# Patient Record
Sex: Female | Born: 1980 | Race: Black or African American | Hispanic: No | Marital: Married | Smoking: Never smoker
Health system: Southern US, Community
[De-identification: ages and names within clinical notes are randomized; demographics above are authoritative.]

## PROBLEM LIST (undated history)

## (undated) DIAGNOSIS — T8859XA Other complications of anesthesia, initial encounter: Secondary | ICD-10-CM

## (undated) DIAGNOSIS — K8020 Calculus of gallbladder without cholecystitis without obstruction: Secondary | ICD-10-CM

## (undated) DIAGNOSIS — D72829 Elevated white blood cell count, unspecified: Secondary | ICD-10-CM

## (undated) DIAGNOSIS — E119 Type 2 diabetes mellitus without complications: Secondary | ICD-10-CM

## (undated) DIAGNOSIS — N63 Unspecified lump in unspecified breast: Secondary | ICD-10-CM

## (undated) DIAGNOSIS — M5430 Sciatica, unspecified side: Secondary | ICD-10-CM

## (undated) DIAGNOSIS — I10 Essential (primary) hypertension: Secondary | ICD-10-CM

## (undated) DIAGNOSIS — T4145XA Adverse effect of unspecified anesthetic, initial encounter: Secondary | ICD-10-CM

## (undated) DIAGNOSIS — G4730 Sleep apnea, unspecified: Secondary | ICD-10-CM

## (undated) DIAGNOSIS — R739 Hyperglycemia, unspecified: Secondary | ICD-10-CM

## (undated) DIAGNOSIS — M545 Low back pain, unspecified: Secondary | ICD-10-CM

## (undated) DIAGNOSIS — F329 Major depressive disorder, single episode, unspecified: Secondary | ICD-10-CM

## (undated) DIAGNOSIS — N390 Urinary tract infection, site not specified: Secondary | ICD-10-CM

## (undated) DIAGNOSIS — F32A Depression, unspecified: Secondary | ICD-10-CM

## (undated) HISTORY — DX: Urinary tract infection, site not specified: N39.0

## (undated) HISTORY — DX: Sleep apnea, unspecified: G47.30

## (undated) HISTORY — DX: Unspecified lump in unspecified breast: N63.0

## (undated) HISTORY — DX: Type 2 diabetes mellitus without complications: E11.9

## (undated) HISTORY — DX: Essential (primary) hypertension: I10

## (undated) HISTORY — DX: Elevated white blood cell count, unspecified: D72.829

## (undated) HISTORY — DX: Depression, unspecified: F32.A

## (undated) HISTORY — DX: Major depressive disorder, single episode, unspecified: F32.9

## (undated) HISTORY — DX: Hyperglycemia, unspecified: R73.9

## (undated) HISTORY — DX: Morbid (severe) obesity due to excess calories: E66.01

---

## 2003-12-14 ENCOUNTER — Emergency Department (HOSPITAL_COMMUNITY): Admission: EM | Admit: 2003-12-14 | Discharge: 2003-12-14 | Payer: Self-pay | Admitting: Emergency Medicine

## 2004-10-27 ENCOUNTER — Emergency Department (HOSPITAL_COMMUNITY): Admission: EM | Admit: 2004-10-27 | Discharge: 2004-10-27 | Payer: Self-pay | Admitting: Emergency Medicine

## 2005-06-16 ENCOUNTER — Emergency Department (HOSPITAL_COMMUNITY): Admission: EM | Admit: 2005-06-16 | Discharge: 2005-06-16 | Payer: Self-pay | Admitting: *Deleted

## 2005-12-19 ENCOUNTER — Emergency Department (HOSPITAL_COMMUNITY): Admission: EM | Admit: 2005-12-19 | Discharge: 2005-12-19 | Payer: Self-pay | Admitting: Emergency Medicine

## 2005-12-28 ENCOUNTER — Emergency Department (HOSPITAL_COMMUNITY): Admission: EM | Admit: 2005-12-28 | Discharge: 2005-12-28 | Payer: Self-pay | Admitting: Emergency Medicine

## 2007-02-13 ENCOUNTER — Ambulatory Visit: Payer: Self-pay | Admitting: Family Medicine

## 2007-02-13 DIAGNOSIS — F329 Major depressive disorder, single episode, unspecified: Secondary | ICD-10-CM

## 2007-02-16 ENCOUNTER — Encounter (INDEPENDENT_AMBULATORY_CARE_PROVIDER_SITE_OTHER): Payer: Self-pay | Admitting: Family Medicine

## 2007-02-17 ENCOUNTER — Telehealth (INDEPENDENT_AMBULATORY_CARE_PROVIDER_SITE_OTHER): Payer: Self-pay | Admitting: *Deleted

## 2007-02-17 LAB — CONVERTED CEMR LAB
ALT: 12 units/L (ref 0–35)
AST: 13 units/L (ref 0–37)
Albumin: 4 g/dL (ref 3.5–5.2)
Alkaline Phosphatase: 62 units/L (ref 39–117)
BUN: 18 mg/dL (ref 6–23)
Basophils Absolute: 0 10*3/uL (ref 0.0–0.1)
Basophils Relative: 0 % (ref 0–1)
CO2: 23 meq/L (ref 19–32)
Calcium: 9.2 mg/dL (ref 8.4–10.5)
Chloride: 106 meq/L (ref 96–112)
Cholesterol: 177 mg/dL (ref 0–200)
Creatinine, Ser: 0.96 mg/dL (ref 0.40–1.20)
Eosinophils Absolute: 0.6 10*3/uL (ref 0.0–0.7)
Eosinophils Relative: 5 % (ref 0–5)
Glucose, Bld: 110 mg/dL — ABNORMAL HIGH (ref 70–99)
HCT: 42.9 % (ref 36.0–46.0)
HDL: 39 mg/dL — ABNORMAL LOW (ref >39)
Hemoglobin: 13.4 g/dL (ref 12.0–15.0)
LDL Cholesterol: 119 mg/dL — ABNORMAL HIGH (ref 0–99)
Lymphocytes Relative: 18 % (ref 12–46)
Lymphs Abs: 1.9 10*3/uL (ref 0.7–3.3)
MCHC: 31.2 g/dL (ref 30.0–36.0)
MCV: 90.1 fL (ref 78.0–100.0)
Monocytes Absolute: 0.7 10*3/uL (ref 0.2–0.7)
Monocytes Relative: 6 % (ref 3–11)
Neutro Abs: 7.5 10*3/uL (ref 1.7–7.7)
Neutrophils Relative %: 70 % (ref 43–77)
Platelets: 286 10*3/uL (ref 150–400)
Potassium: 3.9 meq/L (ref 3.5–5.3)
RBC: 4.76 M/uL (ref 3.87–5.11)
RDW: 13.9 % (ref 11.5–14.0)
Sodium: 142 meq/L (ref 135–145)
TSH: 1.919 microintl units/mL (ref 0.350–5.50)
Total Bilirubin: 0.4 mg/dL (ref 0.3–1.2)
Total CHOL/HDL Ratio: 4.5
Total Protein: 6.8 g/dL (ref 6.0–8.3)
Triglycerides: 96 mg/dL (ref <150)
VLDL: 19 mg/dL (ref 0–40)
WBC: 10.7 10*3/uL — ABNORMAL HIGH (ref 4.0–10.5)

## 2007-02-26 ENCOUNTER — Ambulatory Visit: Payer: Self-pay | Admitting: Family Medicine

## 2007-02-26 DIAGNOSIS — I10 Essential (primary) hypertension: Secondary | ICD-10-CM | POA: Insufficient documentation

## 2007-02-26 DIAGNOSIS — R7309 Other abnormal glucose: Secondary | ICD-10-CM | POA: Insufficient documentation

## 2007-02-26 LAB — CONVERTED CEMR LAB
Cholesterol, target level: 200 mg/dL
HDL goal, serum: 40 mg/dL
LDL Goal: 130 mg/dL

## 2007-02-27 ENCOUNTER — Telehealth (INDEPENDENT_AMBULATORY_CARE_PROVIDER_SITE_OTHER): Payer: Self-pay | Admitting: *Deleted

## 2007-03-04 ENCOUNTER — Encounter (INDEPENDENT_AMBULATORY_CARE_PROVIDER_SITE_OTHER): Payer: Self-pay | Admitting: Family Medicine

## 2007-07-13 ENCOUNTER — Ambulatory Visit: Payer: Self-pay | Admitting: Family Medicine

## 2007-07-13 ENCOUNTER — Telehealth (INDEPENDENT_AMBULATORY_CARE_PROVIDER_SITE_OTHER): Payer: Self-pay | Admitting: *Deleted

## 2007-07-15 ENCOUNTER — Ambulatory Visit (HOSPITAL_COMMUNITY): Admission: RE | Admit: 2007-07-15 | Discharge: 2007-07-15 | Payer: Self-pay | Admitting: Family Medicine

## 2007-07-20 ENCOUNTER — Telehealth (INDEPENDENT_AMBULATORY_CARE_PROVIDER_SITE_OTHER): Payer: Self-pay | Admitting: Family Medicine

## 2007-10-01 ENCOUNTER — Encounter (INDEPENDENT_AMBULATORY_CARE_PROVIDER_SITE_OTHER): Payer: Self-pay | Admitting: Family Medicine

## 2008-05-25 ENCOUNTER — Ambulatory Visit: Payer: Self-pay | Admitting: Family Medicine

## 2008-05-25 LAB — CONVERTED CEMR LAB
Bilirubin Urine: NEGATIVE
Blood in Urine, dipstick: NEGATIVE
Glucose, Urine, Semiquant: NEGATIVE
Ketones, urine, test strip: NEGATIVE
Nitrite: POSITIVE
Protein, U semiquant: 30
Specific Gravity, Urine: 1.025
Urobilinogen, UA: 1
WBC Urine, dipstick: NEGATIVE
pH: 5.5

## 2008-06-16 ENCOUNTER — Other Ambulatory Visit: Admission: RE | Admit: 2008-06-16 | Discharge: 2008-06-16 | Payer: Self-pay | Admitting: Family Medicine

## 2008-06-16 ENCOUNTER — Ambulatory Visit: Payer: Self-pay | Admitting: Family Medicine

## 2008-06-16 ENCOUNTER — Encounter (INDEPENDENT_AMBULATORY_CARE_PROVIDER_SITE_OTHER): Payer: Self-pay | Admitting: Family Medicine

## 2008-06-16 DIAGNOSIS — T7421XA Adult sexual abuse, confirmed, initial encounter: Secondary | ICD-10-CM | POA: Insufficient documentation

## 2008-06-17 ENCOUNTER — Encounter (INDEPENDENT_AMBULATORY_CARE_PROVIDER_SITE_OTHER): Payer: Self-pay | Admitting: Family Medicine

## 2008-06-21 LAB — CONVERTED CEMR LAB
ALT: 16 units/L (ref 0–35)
AST: 16 units/L (ref 0–37)
Albumin: 4 g/dL (ref 3.5–5.2)
Alkaline Phosphatase: 62 units/L (ref 39–117)
BUN: 16 mg/dL (ref 6–23)
Basophils Absolute: 0.1 10*3/uL (ref 0.0–0.1)
Basophils Relative: 1 % (ref 0–1)
CO2: 24 meq/L (ref 19–32)
Calcium: 8.9 mg/dL (ref 8.4–10.5)
Chloride: 106 meq/L (ref 96–112)
Cholesterol: 177 mg/dL (ref 0–200)
Creatinine, Ser: 0.91 mg/dL (ref 0.40–1.20)
Eosinophils Absolute: 0.3 10*3/uL (ref 0.0–0.7)
Eosinophils Relative: 3 % (ref 0–5)
Glucose, Bld: 116 mg/dL — ABNORMAL HIGH (ref 70–99)
HCT: 42.3 % (ref 36.0–46.0)
HCV Ab: NEGATIVE
HDL: 38 mg/dL — ABNORMAL LOW (ref >39)
Hemoglobin: 13.4 g/dL (ref 12.0–15.0)
Hep A IgM: NEGATIVE
Hep B C IgM: NEGATIVE
Hepatitis B Surface Ag: NEGATIVE
LDL Cholesterol: 123 mg/dL — ABNORMAL HIGH (ref 0–99)
Lymphocytes Relative: 25 % (ref 12–46)
Lymphs Abs: 2.6 10*3/uL (ref 0.7–4.0)
MCHC: 31.7 g/dL (ref 30.0–36.0)
MCV: 87.9 fL (ref 78.0–100.0)
Monocytes Absolute: 0.7 10*3/uL (ref 0.1–1.0)
Monocytes Relative: 7 % (ref 3–12)
Neutro Abs: 6.7 10*3/uL (ref 1.7–7.7)
Neutrophils Relative %: 65 % (ref 43–77)
Platelets: 309 10*3/uL (ref 150–400)
Potassium: 4.1 meq/L (ref 3.5–5.3)
RBC: 4.81 M/uL (ref 3.87–5.11)
RDW: 13.9 % (ref 11.5–15.5)
Sodium: 141 meq/L (ref 135–145)
TSH: 1.548 microintl units/mL (ref 0.350–4.50)
Total Bilirubin: 0.5 mg/dL (ref 0.3–1.2)
Total CHOL/HDL Ratio: 4.7
Total Protein: 6.9 g/dL (ref 6.0–8.3)
Triglycerides: 81 mg/dL (ref <150)
VLDL: 16 mg/dL (ref 0–40)
WBC: 10.3 10*3/uL (ref 4.0–10.5)

## 2008-08-04 ENCOUNTER — Encounter (INDEPENDENT_AMBULATORY_CARE_PROVIDER_SITE_OTHER): Payer: Self-pay | Admitting: Family Medicine

## 2008-09-12 ENCOUNTER — Ambulatory Visit: Payer: Self-pay | Admitting: Family Medicine

## 2008-09-12 LAB — CONVERTED CEMR LAB
Bilirubin Urine: NEGATIVE
Glucose, Urine, Semiquant: NEGATIVE
Ketones, urine, test strip: NEGATIVE
Nitrite: NEGATIVE
Protein, U semiquant: 30
Specific Gravity, Urine: 1.015
Urobilinogen, UA: 0.2
pH: 5.5

## 2008-09-30 ENCOUNTER — Ambulatory Visit: Payer: Self-pay | Admitting: Family Medicine

## 2008-09-30 ENCOUNTER — Ambulatory Visit (HOSPITAL_COMMUNITY): Admission: RE | Admit: 2008-09-30 | Discharge: 2008-09-30 | Payer: Self-pay | Admitting: Family Medicine

## 2008-09-30 DIAGNOSIS — E786 Lipoprotein deficiency: Secondary | ICD-10-CM | POA: Insufficient documentation

## 2008-10-02 ENCOUNTER — Emergency Department (HOSPITAL_COMMUNITY): Admission: EM | Admit: 2008-10-02 | Discharge: 2008-10-02 | Payer: Self-pay | Admitting: Emergency Medicine

## 2008-10-10 ENCOUNTER — Ambulatory Visit: Payer: Self-pay | Admitting: Family Medicine

## 2008-10-10 ENCOUNTER — Telehealth (INDEPENDENT_AMBULATORY_CARE_PROVIDER_SITE_OTHER): Payer: Self-pay | Admitting: *Deleted

## 2008-10-14 ENCOUNTER — Ambulatory Visit (HOSPITAL_COMMUNITY): Admission: RE | Admit: 2008-10-14 | Discharge: 2008-10-14 | Payer: Self-pay | Admitting: Family Medicine

## 2008-10-17 DIAGNOSIS — M5126 Other intervertebral disc displacement, lumbar region: Secondary | ICD-10-CM

## 2008-10-18 ENCOUNTER — Telehealth (INDEPENDENT_AMBULATORY_CARE_PROVIDER_SITE_OTHER): Payer: Self-pay | Admitting: *Deleted

## 2008-10-27 ENCOUNTER — Encounter (INDEPENDENT_AMBULATORY_CARE_PROVIDER_SITE_OTHER): Payer: Self-pay | Admitting: Family Medicine

## 2008-11-08 ENCOUNTER — Telehealth (INDEPENDENT_AMBULATORY_CARE_PROVIDER_SITE_OTHER): Payer: Self-pay | Admitting: *Deleted

## 2008-11-21 ENCOUNTER — Encounter (INDEPENDENT_AMBULATORY_CARE_PROVIDER_SITE_OTHER): Payer: Self-pay | Admitting: Family Medicine

## 2009-01-19 ENCOUNTER — Ambulatory Visit: Payer: Self-pay | Admitting: Family Medicine

## 2009-01-26 ENCOUNTER — Telehealth (INDEPENDENT_AMBULATORY_CARE_PROVIDER_SITE_OTHER): Payer: Self-pay | Admitting: Family Medicine

## 2009-02-22 ENCOUNTER — Ambulatory Visit (HOSPITAL_COMMUNITY): Payer: Self-pay | Admitting: Psychiatry

## 2009-04-13 ENCOUNTER — Ambulatory Visit: Payer: Self-pay | Admitting: Family Medicine

## 2009-04-13 DIAGNOSIS — R300 Dysuria: Secondary | ICD-10-CM | POA: Insufficient documentation

## 2009-04-13 LAB — CONVERTED CEMR LAB
Bilirubin Urine: NEGATIVE
Blood in Urine, dipstick: NEGATIVE
Glucose, Urine, Semiquant: NEGATIVE
Ketones, urine, test strip: NEGATIVE
Nitrite: NEGATIVE
Protein, U semiquant: 30
Specific Gravity, Urine: >=1.03
Urobilinogen, UA: 0.2
WBC Urine, dipstick: NEGATIVE
pH: 5.5

## 2009-05-01 ENCOUNTER — Ambulatory Visit: Payer: Self-pay | Admitting: Family Medicine

## 2009-05-01 DIAGNOSIS — D485 Neoplasm of uncertain behavior of skin: Secondary | ICD-10-CM

## 2009-05-02 ENCOUNTER — Telehealth (INDEPENDENT_AMBULATORY_CARE_PROVIDER_SITE_OTHER): Payer: Self-pay | Admitting: *Deleted

## 2009-05-16 ENCOUNTER — Encounter (INDEPENDENT_AMBULATORY_CARE_PROVIDER_SITE_OTHER): Payer: Self-pay | Admitting: *Deleted

## 2009-05-16 LAB — CONVERTED CEMR LAB
ALT: 17 units/L
AST: 19 units/L
Albumin: 4.1 g/dL
Alkaline Phosphatase: 62 units/L
BUN: 15 mg/dL
CO2: 27 meq/L
Calcium: 9.4 mg/dL
Chloride: 104 meq/L
Cholesterol: 186 mg/dL
Creatinine, Ser: 0.89 mg/dL
Glucose, Bld: 104 mg/dL
HCT: 43.1 %
HDL: 37 mg/dL
Hemoglobin: 13.8 g/dL
Hgb A1c MFr Bld: 6.2 %
LDL Cholesterol: 131 mg/dL
MCV: 85.3 fL
Platelets: 306 10*3/uL
Potassium: 4.2 meq/L
Sed Rate: 7 mm/hr
Sodium: 141 meq/L
TSH: 0.922 microintl units/mL
Total Protein: 7.3 g/dL
Triglycerides: 89 mg/dL
WBC: 9.3 10*3/uL

## 2009-05-25 ENCOUNTER — Ambulatory Visit (HOSPITAL_COMMUNITY): Admission: RE | Admit: 2009-05-25 | Discharge: 2009-05-25 | Payer: Self-pay | Admitting: Internal Medicine

## 2009-06-29 ENCOUNTER — Encounter (INDEPENDENT_AMBULATORY_CARE_PROVIDER_SITE_OTHER): Payer: Self-pay | Admitting: *Deleted

## 2009-06-29 ENCOUNTER — Ambulatory Visit: Payer: Self-pay | Admitting: Cardiology

## 2009-06-30 ENCOUNTER — Ambulatory Visit: Payer: Self-pay | Admitting: Cardiology

## 2009-06-30 ENCOUNTER — Ambulatory Visit (HOSPITAL_COMMUNITY): Admission: RE | Admit: 2009-06-30 | Discharge: 2009-06-30 | Payer: Self-pay | Admitting: Cardiology

## 2009-06-30 ENCOUNTER — Encounter: Payer: Self-pay | Admitting: Cardiology

## 2009-07-06 ENCOUNTER — Encounter (INDEPENDENT_AMBULATORY_CARE_PROVIDER_SITE_OTHER): Payer: Self-pay | Admitting: *Deleted

## 2009-08-04 ENCOUNTER — Telehealth (INDEPENDENT_AMBULATORY_CARE_PROVIDER_SITE_OTHER): Payer: Self-pay | Admitting: *Deleted

## 2009-12-06 ENCOUNTER — Emergency Department (HOSPITAL_COMMUNITY)
Admission: EM | Admit: 2009-12-06 | Discharge: 2009-12-06 | Payer: Self-pay | Source: Home / Self Care | Admitting: Emergency Medicine

## 2010-05-21 ENCOUNTER — Encounter (INDEPENDENT_AMBULATORY_CARE_PROVIDER_SITE_OTHER): Payer: Self-pay | Admitting: *Deleted

## 2010-05-21 ENCOUNTER — Ambulatory Visit: Payer: Self-pay | Admitting: Cardiology

## 2010-07-13 ENCOUNTER — Ambulatory Visit (HOSPITAL_COMMUNITY): Admission: RE | Admit: 2010-07-13 | Discharge: 2010-07-13 | Payer: Self-pay | Admitting: Internal Medicine

## 2010-09-09 ENCOUNTER — Encounter: Payer: Self-pay | Admitting: Neurosurgery

## 2010-09-18 NOTE — Letter (Signed)
Summary: Work Writer at [employer]  [address] [place] [address]   Phone: [phone]  Fax: [phone]     May 21, 2010    [facility]   The above named patient had a medical visit today at: 2:00   pm.  Please take this into consideration when reviewing the time away from work/school.      Sincerely yours,  Architectural technologist

## 2010-09-18 NOTE — Progress Notes (Signed)
Summary: [place] results  Phone Note Outgoing Call   Call placed by: Sonny Dandy,  July 20, 2007 9:52 AM Summary of Call: Results given to patient, voices understanding. Patient wanted to know if she needed a follow appointment and do they do surgery on these types of wounds. Initial call taken by: Sonny Dandy,  July 20, 2007 9:53 AM  Follow-up for Phone Call        Lesion does not require surgery. Should resolve spontaneously Recheck with me in 4 weeks. Follow-up by: Franchot Heidelberg MD,  July 21, 2007 11:19 AM  Additional Follow-up for Phone Call Additional follow up Details #1::        patient notified and will make a 4 week appointment Additional Follow-up by: Sonny Dandy,  July 21, 2007 11:27 AM
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
## 2011-02-25 ENCOUNTER — Encounter: Payer: Self-pay | Admitting: Cardiology

## 2011-07-17 ENCOUNTER — Emergency Department (HOSPITAL_COMMUNITY): Payer: BC Managed Care – PPO

## 2011-07-17 ENCOUNTER — Emergency Department (HOSPITAL_COMMUNITY)
Admission: EM | Admit: 2011-07-17 | Discharge: 2011-07-17 | Disposition: A | Discharge status: Home / Self Care | Payer: BC Managed Care – PPO | Attending: Emergency Medicine | Admitting: Emergency Medicine

## 2011-07-17 ENCOUNTER — Encounter (HOSPITAL_COMMUNITY): Payer: Self-pay | Admitting: *Deleted

## 2011-07-17 DIAGNOSIS — K2970 Gastritis, unspecified, without bleeding: Secondary | ICD-10-CM | POA: Insufficient documentation

## 2011-07-17 DIAGNOSIS — N390 Urinary tract infection, site not specified: Secondary | ICD-10-CM

## 2011-07-17 DIAGNOSIS — Z6832 Body mass index (BMI) 32.0-32.9, adult: Secondary | ICD-10-CM | POA: Insufficient documentation

## 2011-07-17 DIAGNOSIS — I10 Essential (primary) hypertension: Secondary | ICD-10-CM | POA: Insufficient documentation

## 2011-07-17 LAB — DIFFERENTIAL
Basophils Absolute: 0 10*3/uL (ref 0.0–0.1)
Basophils Relative: 0 % (ref 0–1)
Eosinophils Absolute: 0.2 10*3/uL (ref 0.0–0.7)
Eosinophils Relative: 2 % (ref 0–5)
Lymphocytes Relative: 17 % (ref 12–46)
Lymphs Abs: 1.7 10*3/uL (ref 0.7–4.0)
Monocytes Absolute: 0.7 10*3/uL (ref 0.1–1.0)
Monocytes Relative: 6 % (ref 3–12)
Neutro Abs: 8 10*3/uL — ABNORMAL HIGH (ref 1.7–7.7)
Neutrophils Relative %: 76 % (ref 43–77)

## 2011-07-17 LAB — CBC
Hemoglobin: 13 g/dL (ref 12.0–15.0)
MCH: 27.6 pg (ref 26.0–34.0)
MCHC: 32.1 g/dL (ref 30.0–36.0)
MCV: 86 fL (ref 78.0–100.0)
Platelets: 263 10*3/uL (ref 150–400)

## 2011-07-17 LAB — BASIC METABOLIC PANEL
BUN: 15 mg/dL (ref 6–23)
CO2: 25 mEq/L (ref 19–32)
Calcium: 9.3 mg/dL (ref 8.4–10.5)
Creatinine, Ser: 0.76 mg/dL (ref 0.50–1.10)
GFR calc Af Amer: >90 mL/min (ref >90)
GFR calc non Af Amer: >90 mL/min (ref >90)
Sodium: 135 mEq/L (ref 135–145)

## 2011-07-17 LAB — URINALYSIS, ROUTINE W REFLEX MICROSCOPIC
Bilirubin Urine: NEGATIVE
Hgb urine dipstick: NEGATIVE
Ketones, ur: NEGATIVE mg/dL
Leukocytes, UA: NEGATIVE
Nitrite: POSITIVE — AB
Specific Gravity, Urine: 1.02 (ref 1.005–1.030)
Urobilinogen, UA: 0.2 mg/dL (ref 0.0–1.0)
pH: 6 (ref 5.0–8.0)

## 2011-07-17 LAB — LIPASE, BLOOD: Lipase: 26 U/L (ref 11–59)

## 2011-07-17 LAB — D-DIMER, QUANTITATIVE: D-Dimer, Quant: 0.41 ug/mL-FEU (ref 0.00–0.48)

## 2011-07-17 LAB — HEPATIC FUNCTION PANEL
ALT: 13 U/L (ref 0–35)
Alkaline Phosphatase: 58 U/L (ref 39–117)
Bilirubin, Direct: <0.1 mg/dL (ref 0.0–0.3)
Total Bilirubin: 0.2 mg/dL — ABNORMAL LOW (ref 0.3–1.2)

## 2011-07-17 LAB — URINE MICROSCOPIC-ADD ON

## 2011-07-17 MED ORDER — HYDROCODONE-ACETAMINOPHEN 5-325 MG PO TABS: 1.0000 | ORAL_TABLET | Freq: Four times a day (QID) | ORAL | Status: AC | PRN

## 2011-07-17 MED ORDER — PANTOPRAZOLE SODIUM 20 MG PO TBEC: 20.0000 mg | DELAYED_RELEASE_TABLET | Freq: Every day | ORAL | Status: DC

## 2011-07-17 MED ORDER — PANTOPRAZOLE SODIUM 40 MG IV SOLR
40.0000 mg | Freq: Once | INTRAVENOUS | Status: AC
  Administered 2011-07-17: 40 mg via INTRAVENOUS
  Filled 2011-07-17: qty 40

## 2011-07-17 MED ORDER — ONDANSETRON HCL 4 MG/2ML IJ SOLN
4.0000 mg | Freq: Once | INTRAMUSCULAR | Status: AC
  Administered 2011-07-17: 4 mg via INTRAVENOUS
  Filled 2011-07-17: qty 2

## 2011-07-17 MED ORDER — HYDROMORPHONE HCL PF 1 MG/ML IJ SOLN: 1.0000 mg | Freq: Once | INTRAMUSCULAR | Status: DC

## 2011-07-17 MED ORDER — HYDROMORPHONE HCL PF 2 MG/ML IJ SOLN
INTRAMUSCULAR | Status: AC
  Administered 2011-07-17: 1 mg via INTRAVENOUS
  Filled 2011-07-17: qty 1

## 2011-07-17 MED ORDER — PROMETHAZINE HCL 25 MG PO TABS: 25.0000 mg | ORAL_TABLET | Freq: Four times a day (QID) | ORAL | Status: DC | PRN

## 2011-07-17 MED ORDER — HYDROMORPHONE HCL PF 1 MG/ML IJ SOLN
1.0000 mg | Freq: Once | INTRAMUSCULAR | Status: DC
  Filled 2011-07-17: qty 1

## 2011-07-17 MED ORDER — CIPROFLOXACIN HCL 500 MG PO TABS: 500.0000 mg | ORAL_TABLET | Freq: Two times a day (BID) | ORAL | Status: AC

## 2011-07-17 NOTE — ED Provider Notes (Signed)
History  This chart was scribed for Julie Lennert, MD by Julie Berry. This patient was seen in room APA08/APA08 and the patient's care was started at 8:53AM.  CSN: [id number] Arrival date & time: 07/17/2011  8:15 AM   First MD Initiated Contact with Patient 07/17/11 0830      Chief Complaint  Patient presents with  . Abdominal Pain    Patient is a 30 y.o. female presenting with abdominal pain. The history is provided by the patient. No language interpreter was used.  Abdominal Pain The primary symptoms of the illness include abdominal pain. The primary symptoms of the illness do not include fever, fatigue, nausea, vomiting or diarrhea. The current episode started 3 to 5 hours ago. The onset of the illness was sudden. The problem has been gradually worsening.  Additional symptoms associated with the illness include chills and back pain. Symptoms associated with the illness do not include urgency, hematuria or frequency. Significant associated medical issues do not include diabetes or cardiac disease.    Julie Berry is a 30 y.o. female who presents to the Emergency Department complaining of 3 hours of sudden onset, gradually worsening, constant abdominal pain described as cramping and burning that radiates into in a lower back pain described as stabbing that started after she woke up this morning.Pt states that breathing makes it worse and that laying down doesn't improve symptoms. Pt reports that she did not take any pain medications and that she has never experienced symptoms like this before. Pt reports that her LNMP was Nov. 3rd.   Past Medical Hx:  Past Medical History  Diagnosis Date  . UTI (urinary tract infection)   . HTN (hypertension)   . Cerebrovascular accident     possible  . Lump or mass in breast   . Leukocytosis   . Hyperglycemia   . Malaise and fatigue   . Morbid obesity   . Sleep apnea   . Depressed     History reviewed. No pertinent past surgical  history.  Family Hx:  Family History  Problem Relation Age of Onset  . Diabetes Mother   . Rheumatic fever Mother 33  . Migraines Father 39  . Hypertension Father   . Autism Brother    Social Hx:   History  Substance Use Topics  . Smoking status: Never Smoker   . Smokeless tobacco: Not on file  . Alcohol Use: No    OB History    Grav Para Term Preterm Abortions TAB SAB Ect Mult Living                  Review of Systems  Constitutional: Positive for chills. Negative for fever and fatigue.  HENT: Negative for congestion, sinus pressure and ear discharge.   Eyes: Negative for discharge.  Respiratory: Negative for cough.   Cardiovascular: Negative for chest pain.  Gastrointestinal: Positive for abdominal pain. Negative for nausea, vomiting and diarrhea.  Genitourinary: Negative for urgency, frequency and hematuria.  Musculoskeletal: Positive for back pain.  Skin: Negative for rash.  Neurological: Negative for seizures and headaches.  Hematological: Negative.   Psychiatric/Behavioral: Negative for hallucinations.    Allergies  Amoxicillin and Latex  Home Medications   Current Outpatient Rx  Name Route Sig Dispense Refill  . CIPROFLOXACIN HCL 500 MG PO TABS Oral Take 1 tablet (500 mg total) by mouth every 12 (twelve) hours. 14 tablet 0  . HYDROCODONE-ACETAMINOPHEN 5-325 MG PO TABS Oral Take 1 tablet by mouth every 6 (  six) hours as needed for pain. 20 tablet 0  . PANTOPRAZOLE SODIUM 20 MG PO TBEC Oral Take 1 tablet (20 mg total) by mouth daily. 30 tablet 0  . PROMETHAZINE HCL 25 MG PO TABS Oral Take 1 tablet (25 mg total) by mouth every 6 (six) hours as needed for nausea. 15 tablet 0    Triage Vitals: BP 124/65  Pulse 80  Temp(Src) 97.5 F (36.4 C) (Oral)  Resp 18  Ht 5\' 6"  (1.676 m)  Wt 200 lb (90.719 kg)  BMI 32.28 kg/m2  SpO2 100%  LMP 06/22/2011  Physical Exam  Nursing note and vitals reviewed. Constitutional: She appears well-developed and  well-nourished. No distress.  HENT:  Head: Normocephalic and atraumatic.  Right Ear: External ear normal.  Left Ear: External ear normal.  Eyes: Conjunctivae are normal. Right eye exhibits no discharge. Left eye exhibits no discharge. No scleral icterus.  Neck: Neck supple. No tracheal deviation present.  Cardiovascular: Normal rate, regular rhythm and intact distal pulses.   Pulmonary/Chest: Effort normal and breath sounds normal. No stridor. No respiratory distress. She has no wheezes. She has no rales.  Abdominal: Soft. Bowel sounds are normal. She exhibits no distension. There is tenderness (mild tenderness in the RUQ and epigastric regions). There is no rebound and no guarding.  Musculoskeletal: She exhibits no edema and no tenderness.  Neurological: She is alert. She has normal strength. No sensory deficit. Cranial nerve deficit:  no gross defecits noted. She exhibits normal muscle tone. She displays no seizure activity. Coordination normal.  Skin: Skin is warm and dry. No rash noted.  Psychiatric: She has a normal mood and affect.    ED Course  Procedures (including critical care time)  DIAGNOSTIC STUDIES:    COORDINATION OF CARE: 8:54AM-Discussed treatment plan with patient at bedside and patient agreed to plan. 12:50PM- Pt rechecked and stated that the pain is back. Pt now c/o SOB. All labs and x-rays reviewed with patient and treatment plan discussed.  Results for orders placed during the hospital encounter of 07/17/11  URINALYSIS, ROUTINE W REFLEX MICROSCOPIC      Component Value Range   Color, Urine YELLOW  YELLOW    APPearance CLEAR  CLEAR    Specific Gravity, Urine 1.020  1.005 - 1.030    pH 6.0  5.0 - 8.0    Glucose, UA NEGATIVE  NEGATIVE (mg/dL)   Hgb urine dipstick NEGATIVE  NEGATIVE    Bilirubin Urine NEGATIVE  NEGATIVE    Ketones, ur NEGATIVE  NEGATIVE (mg/dL)   Protein, ur NEGATIVE  NEGATIVE (mg/dL)   Urobilinogen, UA 0.2  0.0 - 1.0 (mg/dL)   Nitrite POSITIVE  (*) NEGATIVE    Leukocytes, UA NEGATIVE  NEGATIVE   CBC      Component Value Range   WBC 10.6 (*) 4.0 - 10.5 (K/uL)   RBC 4.71  3.87 - 5.11 (MIL/uL)   Hemoglobin 13.0  12.0 - 15.0 (g/dL)   HCT 16.1  09.6 - 04.5 (%)   MCV 86.0  78.0 - 100.0 (fL)   MCH 27.6  26.0 - 34.0 (pg)   MCHC 32.1  30.0 - 36.0 (g/dL)   RDW 40.9  81.1 - 91.4 (%)   Platelets 263  150 - 400 (K/uL)  DIFFERENTIAL      Component Value Range   Neutrophils Relative 76  43 - 77 (%)   Neutro Abs 8.0 (*) 1.7 - 7.7 (K/uL)   Lymphocytes Relative 17  12 - 46 (%)  Lymphs Abs 1.7  0.7 - 4.0 (K/uL)   Monocytes Relative 6  3 - 12 (%)   Monocytes Absolute 0.7  0.1 - 1.0 (K/uL)   Eosinophils Relative 2  0 - 5 (%)   Eosinophils Absolute 0.2  0.0 - 0.7 (K/uL)   Basophils Relative 0  0 - 1 (%)   Basophils Absolute 0.0  0.0 - 0.1 (K/uL)  BASIC METABOLIC PANEL      Component Value Range   Sodium 135  135 - 145 (mEq/L)   Potassium 3.8  3.5 - 5.1 (mEq/L)   Chloride 103  96 - 112 (mEq/L)   CO2 25  19 - 32 (mEq/L)   Glucose, Bld 159 (*) 70 - 99 (mg/dL)   BUN 15  6 - 23 (mg/dL)   Creatinine, Ser 4.09  0.50 - 1.10 (mg/dL)   Calcium 9.3  8.4 - 81.1 (mg/dL)   GFR calc non Af Amer >90  >90 (mL/min)   GFR calc Af Amer >90  >90 (mL/min)  LIPASE, BLOOD      Component Value Range   Lipase 26  11 - 59 (U/L)  PREGNANCY, URINE      Component Value Range   Preg Test, Ur NEGATIVE    HEPATIC FUNCTION PANEL      Component Value Range   Total Protein 7.4  6.0 - 8.3 (g/dL)   Albumin 3.5  3.5 - 5.2 (g/dL)   AST 13  0 - 37 (U/L)   ALT 13  0 - 35 (U/L)   Alkaline Phosphatase 58  39 - 117 (U/L)   Total Bilirubin 0.2 (*) 0.3 - 1.2 (mg/dL)   Bilirubin, Direct <9.1  0.0 - 0.3 (mg/dL)   Indirect Bilirubin NOT CALCULATED  0.3 - 0.9 (mg/dL)  URINE MICROSCOPIC-ADD ON      Component Value Range   Squamous Epithelial / LPF FEW (*) RARE    RBC / HPF 0-2  <3 (RBC/hpf)   Bacteria, UA MANY (*) RARE   D-DIMER, QUANTITATIVE      Component Value Range     D-Dimer, Quant 0.41  0.00 - 0.48 (ug/mL-FEU)  POCT I-STAT TROPONIN I      Component Value Range   Troponin i, poc 0.00  0.00 - 0.08 (ng/mL)   Comment 3            US Abdomen Complete  07/17/2011  *RADIOLOGY REPORT*  Clinical Data:  Abdominal pain.  COMPLETE ABDOMINAL ULTRASOUND  Comparison:  None.  Findings:  Gallbladder:  Small gallstones.  No wall thickening or sonographic Murphy's sign.  Common bile duct:   Normal caliber, 5 mm.  Liver:  No focal lesion identified.  Within normal limits in parenchymal echogenicity.  IVC:  Appears normal.  Pancreas:  No focal abnormality seen.  Spleen:  Within normal limits in size and echotexture.  Right Kidney:   Normal in size and parenchymal echogenicity.  No evidence of mass or hydronephrosis.  Left Kidney:  Normal in size and parenchymal echogenicity.  No evidence of mass or hydronephrosis.  Abdominal aorta:  No aneurysm.  Distal aorta obscured by bowel gas.  IMPRESSION: Small gallstones.  No sonographic changes of acute cholecystitis.  Original Report Authenticated By: Cyndie Chime, M.D.   US Abdomen Complete  07/17/2011  *RADIOLOGY REPORT*  Clinical Data:  Abdominal pain.  COMPLETE ABDOMINAL ULTRASOUND  Comparison:  None.  Findings:  Gallbladder:  Small gallstones.  No wall thickening or sonographic Murphy's sign.  Common bile duct:  Normal caliber, 5 mm.  Liver:  No focal lesion identified.  Within normal limits in parenchymal echogenicity.  IVC:  Appears normal.  Pancreas:  No focal abnormality seen.  Spleen:  Within normal limits in size and echotexture.  Right Kidney:   Normal in size and parenchymal echogenicity.  No evidence of mass or hydronephrosis.  Left Kidney:  Normal in size and parenchymal echogenicity.  No evidence of mass or hydronephrosis.  Abdominal aorta:  No aneurysm.  Distal aorta obscured by bowel gas.  IMPRESSION: Small gallstones.  No sonographic changes of acute cholecystitis.  Original Report Authenticated By: Cyndie Chime, M.D.    Dg Abd Acute W/chest  07/17/2011  *RADIOLOGY REPORT*  Clinical Data: Abdominal and back pain  ACUTE ABDOMEN SERIES (ABDOMEN 2 VIEW & CHEST 1 VIEW)  Comparison: None.  Findings: Fine detail is obscured by patient body habitus. Cardiomediastinal silhouette is within normal limits. The lungs are clear. No pleural effusion.  No pneumothorax.  No acute osseous abnormality.  No free air beneath the diaphragms.  Normal bowel gas pattern.  No abnormal calcific opacity or acute osseous abnormality.  IMPRESSION: Normal exam.  Original Report Authenticated By: Harrel Lemon, M.D.     1. Gastritis   2. UTI (lower urinary tract infection)      Date: 07/17/2011  Rate: 70  Rhythm: normal sinus rhythm  QRS Axis: normal  Intervals: normal  ST/T Wave abnormalities: normal  Conduction Disutrbances:none  Narrative Interpretation:   Old EKG Reviewed: none available    MDM  gastritis    The chart was scribed for me under my direct supervision.  I personally performed the history, physical, and medical decision making and all procedures in the evaluation of this patient.Julie Lennert, MD 07/17/11 1535

## 2011-07-17 NOTE — ED Notes (Signed)
Pt c/o upper abd pain that radiates to her back. Pt states she was woke from sleep this am d/t pain. Pt denies n/v/d.

## 2011-07-22 LAB — URINE CULTURE
Colony Count: >=100000
Culture  Setup Time: 201211281800

## 2011-07-24 NOTE — ED Notes (Signed)
+   Urine Patient treated with Cipro-sensitive to same-chart appended per protocol MD. 

## 2011-09-02 ENCOUNTER — Ambulatory Visit (INDEPENDENT_AMBULATORY_CARE_PROVIDER_SITE_OTHER): Payer: BC Managed Care – PPO | Admitting: Surgery

## 2011-09-13 ENCOUNTER — Encounter (INDEPENDENT_AMBULATORY_CARE_PROVIDER_SITE_OTHER): Payer: Self-pay | Admitting: General Surgery

## 2011-09-16 ENCOUNTER — Ambulatory Visit (INDEPENDENT_AMBULATORY_CARE_PROVIDER_SITE_OTHER): Payer: BC Managed Care – PPO | Admitting: Surgery

## 2011-10-14 ENCOUNTER — Other Ambulatory Visit (HOSPITAL_COMMUNITY): Payer: Self-pay

## 2011-10-18 ENCOUNTER — Ambulatory Visit: Admit: 2011-10-18 | Payer: Self-pay | Admitting: General Surgery

## 2011-10-18 SURGERY — LAPAROSCOPIC CHOLECYSTECTOMY
Anesthesia: General

## 2011-11-15 ENCOUNTER — Emergency Department (HOSPITAL_COMMUNITY)
Admission: EM | Admit: 2011-11-15 | Discharge: 2011-11-15 | Disposition: A | Discharge status: Home / Self Care | Payer: BC Managed Care – PPO | Attending: Emergency Medicine | Admitting: Emergency Medicine

## 2011-11-15 ENCOUNTER — Encounter (HOSPITAL_COMMUNITY): Payer: Self-pay

## 2011-11-15 DIAGNOSIS — R5381 Other malaise: Secondary | ICD-10-CM | POA: Insufficient documentation

## 2011-11-15 DIAGNOSIS — Z7982 Long term (current) use of aspirin: Secondary | ICD-10-CM | POA: Insufficient documentation

## 2011-11-15 DIAGNOSIS — R5383 Other fatigue: Secondary | ICD-10-CM | POA: Insufficient documentation

## 2011-11-15 DIAGNOSIS — F3289 Other specified depressive episodes: Secondary | ICD-10-CM | POA: Insufficient documentation

## 2011-11-15 DIAGNOSIS — H53149 Visual discomfort, unspecified: Secondary | ICD-10-CM | POA: Insufficient documentation

## 2011-11-15 DIAGNOSIS — R110 Nausea: Secondary | ICD-10-CM | POA: Insufficient documentation

## 2011-11-15 DIAGNOSIS — R51 Headache: Secondary | ICD-10-CM | POA: Insufficient documentation

## 2011-11-15 DIAGNOSIS — I10 Essential (primary) hypertension: Secondary | ICD-10-CM | POA: Insufficient documentation

## 2011-11-15 DIAGNOSIS — Z8673 Personal history of transient ischemic attack (TIA), and cerebral infarction without residual deficits: Secondary | ICD-10-CM | POA: Insufficient documentation

## 2011-11-15 DIAGNOSIS — F329 Major depressive disorder, single episode, unspecified: Secondary | ICD-10-CM | POA: Insufficient documentation

## 2011-11-15 MED ORDER — METOCLOPRAMIDE HCL 5 MG/ML IJ SOLN
10.0000 mg | Freq: Once | INTRAMUSCULAR | Status: AC
  Administered 2011-11-15: 10 mg via INTRAVENOUS
  Filled 2011-11-15: qty 2

## 2011-11-15 MED ORDER — KETOROLAC TROMETHAMINE 30 MG/ML IJ SOLN
30.0000 mg | Freq: Once | INTRAMUSCULAR | Status: AC
  Administered 2011-11-15: 30 mg via INTRAVENOUS
  Filled 2011-11-15: qty 1

## 2011-11-15 NOTE — ED Provider Notes (Signed)
History   This chart was scribed for [name], MD by Melba Coon. The patient was seen in room APA17/APA17 and the patient's care was started at 6:00PM.   CSN: [id number]  Arrival date & time 11/15/11  1734   First MD Initiated Contact with Patient 11/15/11 1755      Chief Complaint  Patient presents with  . Headache    (Consider location/radiation/quality/duration/timing/severity/associated sxs/prior treatment) Patient is a 31 y.o. female presenting with headaches. The history is provided by the patient.  Headache  This is a new (Last similar HA in 2004-2005) problem. The current episode started yesterday (last night). The problem occurs constantly. The problem has not changed since onset.The headache is associated with bright light. The pain is located in the parietal ("top of her head") region. The quality of the pain is described as sharp and throbbing. The pain is moderate. The pain does not radiate. Associated symptoms include malaise/fatigue and nausea. Pertinent negatives include no fever, no palpitations, no shortness of breath and no vomiting. She has tried nothing for the symptoms.  No other pertinent medical problems.   Past Medical History  Diagnosis Date  . UTI (urinary tract infection)   . HTN (hypertension)   . Cerebrovascular accident     possible  . Lump or mass in breast   . Leukocytosis   . Hyperglycemia   . Malaise and fatigue   . Morbid obesity   . Sleep apnea   . Depressed     History reviewed. No pertinent past surgical history.  Family History  Problem Relation Age of Onset  . Diabetes Mother   . Rheumatic fever Mother 84  . Migraines Father 20  . Hypertension Father   . Autism Brother     History  Substance Use Topics  . Smoking status: Never Smoker   . Smokeless tobacco: Not on file  . Alcohol Use: No    OB History    Grav Para Term Preterm Abortions TAB SAB Ect Mult Living                  Review of Systems    Constitutional: Positive for malaise/fatigue. Negative for fever, chills and appetite change.  HENT: Negative for congestion, sore throat and rhinorrhea.   Eyes: Negative for photophobia, pain and visual disturbance.  Respiratory: Negative for cough and shortness of breath.   Cardiovascular: Negative for chest pain and palpitations.  Gastrointestinal: Positive for nausea. Negative for vomiting and diarrhea.  Genitourinary: Negative for dysuria and hematuria.  Musculoskeletal: Negative for myalgias, back pain and joint swelling.  Skin: Negative for pallor and rash.  Neurological: Positive for headaches. Negative for syncope and numbness.  Psychiatric/Behavioral: Negative for confusion and sleep disturbance.   10 Systems reviewed and all are negative for acute change except as noted in the HPI.   Allergies  Amoxicillin; Latex; and Penicillins  Home Medications   Current Outpatient Rx  Name Route Sig Dispense Refill  . ASPIRIN 81 MG PO TABS Oral Take 81 mg by mouth daily.    Marland Kitchen PANTOPRAZOLE SODIUM 20 MG PO TBEC Oral Take 1 tablet (20 mg total) by mouth daily. 30 tablet 0    BP 121/61  Pulse 77  Temp(Src) 97.4 F (36.3 C) (Oral)  Resp 20  Ht 5\' 5"  (1.651 m)  Wt 260 lb (117.935 kg)  BMI 43.27 kg/m2  SpO2 100%  LMP 11/11/2011  Physical Exam  Nursing note and vitals reviewed. Constitutional: She is oriented to  person, place, and time. She appears well-developed and well-nourished. No distress.       Awake, alert, nontoxic appearance.  HENT:  Head: Normocephalic and atraumatic.  Eyes: EOM are normal. Pupils are equal, round, and reactive to light. Right eye exhibits no discharge. Left eye exhibits no discharge.  Neck: Normal range of motion. Neck supple.  Cardiovascular: Normal rate and regular rhythm.   Pulmonary/Chest: Effort normal. She exhibits no tenderness.  Abdominal: Soft. There is no tenderness. There is no rebound.  Musculoskeletal: She exhibits no tenderness.        Baseline ROM, no obvious new focal weakness.  Neurological: She is alert and oriented to person, place, and time.       Mental status and motor strength appears baseline for patient and situation.  Skin: Skin is warm. No rash noted.  Psychiatric: She has a normal mood and affect. Her behavior is normal.    ED Course  Procedures (including critical care time)  DIAGNOSTIC STUDIES: Oxygen Saturation is 100% on room air, normal by my interpretation.    COORDINATION OF CARE:  6:05PM - EDMD will order pain meds for the pt. 8:34PM - recheck; pt is doing better; ready for d/c  Labs Reviewed - No data to display No results found.   No diagnosis found.  Pt improved with tx  MDM  Headache tension  The chart was scribed for me under my direct supervision.  I personally performed the history, physical, and medical decision making and all procedures in the evaluation of this patient.[name], MD 11/15/11 2039

## 2011-11-15 NOTE — ED Notes (Signed)
Pt alert & oriented x4, stable gait. Pt given discharge instructions, paperwork. Patient instructed to stop at the registration desk to finish any additional paperwork. pt verbalized understanding. Pt left department w/ no further questions.  

## 2011-11-15 NOTE — Discharge Instructions (Signed)
Follow up if problems

## 2011-11-15 NOTE — ED Notes (Signed)
Pt states headache is getting better at this time. Rates her pain a 5/10. NAD noted, no needs voiced at this time.

## 2011-11-15 NOTE — ED Notes (Signed)
Pt reports headache that began last night.  Pt reports taking aleve at home w/out relief.  Pt reports some nausea and sensitivity to light.

## 2012-05-04 ENCOUNTER — Encounter (HOSPITAL_COMMUNITY): Payer: Self-pay | Admitting: *Deleted

## 2012-05-04 ENCOUNTER — Emergency Department (HOSPITAL_COMMUNITY): Payer: Self-pay

## 2012-05-04 ENCOUNTER — Emergency Department (HOSPITAL_COMMUNITY)
Admission: EM | Admit: 2012-05-04 | Discharge: 2012-05-04 | Disposition: A | Discharge status: Home / Self Care | Payer: Self-pay | Attending: Emergency Medicine | Admitting: Emergency Medicine

## 2012-05-04 DIAGNOSIS — S2341XA Sprain of ribs, initial encounter: Secondary | ICD-10-CM | POA: Insufficient documentation

## 2012-05-04 DIAGNOSIS — G4730 Sleep apnea, unspecified: Secondary | ICD-10-CM | POA: Insufficient documentation

## 2012-05-04 DIAGNOSIS — X58XXXA Exposure to other specified factors, initial encounter: Secondary | ICD-10-CM | POA: Insufficient documentation

## 2012-05-04 DIAGNOSIS — S29011A Strain of muscle and tendon of front wall of thorax, initial encounter: Secondary | ICD-10-CM

## 2012-05-04 DIAGNOSIS — I10 Essential (primary) hypertension: Secondary | ICD-10-CM | POA: Insufficient documentation

## 2012-05-04 MED ORDER — HYDROCODONE-ACETAMINOPHEN 5-325 MG PO TABS: 1.0000 | ORAL_TABLET | Freq: Four times a day (QID) | ORAL | Status: AC | PRN

## 2012-05-04 MED ORDER — IBUPROFEN 800 MG PO TABS
800.0000 mg | ORAL_TABLET | Freq: Once | ORAL | Status: AC
  Administered 2012-05-04: 800 mg via ORAL
  Filled 2012-05-04: qty 1

## 2012-05-04 NOTE — ED Provider Notes (Signed)
Medical screening examination/treatment/procedure(s) were performed by non-physician practitioner and as supervising physician I was immediately available for consultation/collaboration  Harrold Donath R. Rubin Payor, MD 05/04/12 [phone]

## 2012-05-04 NOTE — ED Notes (Signed)
Pain t spine region, hurts to take a deep breath, and move.  Sinus drainage,

## 2012-05-04 NOTE — ED Provider Notes (Signed)
History     CSN: [id number]  Arrival date & time 05/04/12  7829   First MD Initiated Contact with Patient 05/04/12 2139      Chief Complaint  Patient presents with  . Back Pain    (Consider location/radiation/quality/duration/timing/severity/associated sxs/prior treatment) HPI Comments: Feeling SOB and having L scapular area pain with deep inspiration.  Denies any injury.  No cough or fever.  No UTI sxs.  The history is provided by the patient. No language interpreter was used.    Past Medical History  Diagnosis Date  . UTI (urinary tract infection)   . HTN (hypertension)   . Cerebrovascular accident     possible  . Lump or mass in breast   . Leukocytosis   . Hyperglycemia   . Malaise and fatigue   . Morbid obesity   . Sleep apnea   . Depressed     History reviewed. No pertinent past surgical history.  Family History  Problem Relation Age of Onset  . Diabetes Mother   . Rheumatic fever Mother 28  . Migraines Father 46  . Hypertension Father   . Autism Brother     History  Substance Use Topics  . Smoking status: Never Smoker   . Smokeless tobacco: Not on file  . Alcohol Use: No    OB History    Grav Para Term Preterm Abortions TAB SAB Ect Mult Living                  Review of Systems  Allergies  Amoxicillin; Latex; and Penicillins  Home Medications   Current Outpatient Rx  Name Route Sig Dispense Refill  . HYDROCODONE-ACETAMINOPHEN 5-325 MG PO TABS Oral Take 1 tablet by mouth every 6 (six) hours as needed for pain. 20 tablet 0  . NAPROXEN SODIUM 220 MG PO TABS Oral Take 440 mg by mouth once as needed. For pain    . PANTOPRAZOLE SODIUM 20 MG PO TBEC Oral Take 20 mg by mouth daily as needed. For acid reflux      BP 118/62  Pulse 80  Temp 98.3 F (36.8 C) (Oral)  Resp 20  Ht 5\' 6"  (1.676 m)  Wt 270 lb (122.471 kg)  BMI 43.58 kg/m2  SpO2 100%  LMP 04/13/2012  Physical Exam  Nursing note and vitals reviewed. Constitutional: She is  oriented to person, place, and time. She appears well-developed and well-nourished. No distress.  HENT:  Head: Normocephalic and atraumatic.  Eyes: EOM are normal.  Neck: Normal range of motion.  Cardiovascular: Normal rate, regular rhythm and normal heart sounds.   Pulmonary/Chest: Effort normal and breath sounds normal. No accessory muscle usage. Not tachypneic. No respiratory distress.    Abdominal: Soft. She exhibits no distension. There is no tenderness.  Musculoskeletal: Normal range of motion.  Neurological: She is alert and oriented to person, place, and time.  Skin: Skin is warm and dry.  Psychiatric: She has a normal mood and affect. Judgment normal.    ED Course  Procedures (including critical care time)  Labs Reviewed - No data to display Dg Chest 2 View  05/04/2012  *RADIOLOGY REPORT*  Clinical Data: Chest pain  CHEST - 2 VIEW  Comparison: 12/06/2009  Findings: Normal heart size with clear lung fields.  No bony abnormality.  No change from priors.  IMPRESSION: Negative chest.   Original Report Authenticated By: Elsie Stain, M.D.      1. Chest wall muscle strain  MDM  no PNA or pneumothorax.   rx-hydrocodone, 20 Ibuprofen 800 mg TID F/u with PCP Return to ED if any problems in the meantime.      Evalina Field, [place] 05/04/12 [phone]

## 2012-05-18 ENCOUNTER — Emergency Department (HOSPITAL_COMMUNITY)
Admission: EM | Admit: 2012-05-18 | Discharge: 2012-05-18 | Disposition: A | Discharge status: Home / Self Care | Payer: No Typology Code available for payment source | Attending: Emergency Medicine | Admitting: Emergency Medicine

## 2012-05-18 ENCOUNTER — Encounter (HOSPITAL_COMMUNITY): Payer: Self-pay | Admitting: *Deleted

## 2012-05-18 ENCOUNTER — Emergency Department (HOSPITAL_COMMUNITY): Payer: No Typology Code available for payment source

## 2012-05-18 DIAGNOSIS — S39012A Strain of muscle, fascia and tendon of lower back, initial encounter: Secondary | ICD-10-CM

## 2012-05-18 DIAGNOSIS — F3289 Other specified depressive episodes: Secondary | ICD-10-CM | POA: Insufficient documentation

## 2012-05-18 DIAGNOSIS — S161XXA Strain of muscle, fascia and tendon at neck level, initial encounter: Secondary | ICD-10-CM

## 2012-05-18 DIAGNOSIS — S335XXA Sprain of ligaments of lumbar spine, initial encounter: Secondary | ICD-10-CM | POA: Insufficient documentation

## 2012-05-18 DIAGNOSIS — F329 Major depressive disorder, single episode, unspecified: Secondary | ICD-10-CM | POA: Insufficient documentation

## 2012-05-18 DIAGNOSIS — I10 Essential (primary) hypertension: Secondary | ICD-10-CM | POA: Insufficient documentation

## 2012-05-18 DIAGNOSIS — S139XXA Sprain of joints and ligaments of unspecified parts of neck, initial encounter: Secondary | ICD-10-CM | POA: Insufficient documentation

## 2012-05-18 DIAGNOSIS — G4730 Sleep apnea, unspecified: Secondary | ICD-10-CM | POA: Insufficient documentation

## 2012-05-18 MED ORDER — CYCLOBENZAPRINE HCL 5 MG PO TABS: 5.0000 mg | ORAL_TABLET | Freq: Three times a day (TID) | ORAL | Status: DC | PRN

## 2012-05-18 MED ORDER — HYDROCODONE-ACETAMINOPHEN 5-325 MG PO TABS
1.0000 | ORAL_TABLET | Freq: Once | ORAL | Status: AC
  Administered 2012-05-18: 1 via ORAL
  Filled 2012-05-18: qty 1

## 2012-05-18 MED ORDER — HYDROCODONE-ACETAMINOPHEN 5-325 MG PO TABS: 1.0000 | ORAL_TABLET | ORAL | Status: AC | PRN
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
## 2012-05-18 NOTE — ED Provider Notes (Signed)
Medical screening examination/treatment/procedure(s) were performed by non-physician practitioner and as supervising physician I was immediately available for consultation/collaboration.   Shelda Jakes, MD 05/18/12 [phone]

## 2012-05-18 NOTE — ED Notes (Signed)
J. Idol, PA at bedside. 

## 2012-06-20 ENCOUNTER — Inpatient Hospital Stay (HOSPITAL_COMMUNITY)
Admission: EM | Admit: 2012-06-20 | Discharge: 2012-06-23 | DRG: 418 | Disposition: A | Discharge status: Home / Self Care | Payer: Self-pay | Attending: General Surgery | Admitting: General Surgery

## 2012-06-20 ENCOUNTER — Encounter (HOSPITAL_COMMUNITY): Payer: Self-pay | Admitting: *Deleted

## 2012-06-20 ENCOUNTER — Emergency Department (HOSPITAL_COMMUNITY): Payer: Self-pay

## 2012-06-20 DIAGNOSIS — G4730 Sleep apnea, unspecified: Secondary | ICD-10-CM | POA: Diagnosis present

## 2012-06-20 DIAGNOSIS — Z881 Allergy status to other antibiotic agents status: Secondary | ICD-10-CM

## 2012-06-20 DIAGNOSIS — R112 Nausea with vomiting, unspecified: Secondary | ICD-10-CM

## 2012-06-20 DIAGNOSIS — Z880 Allergy status to penicillin: Secondary | ICD-10-CM

## 2012-06-20 DIAGNOSIS — Z91040 Latex allergy status: Secondary | ICD-10-CM

## 2012-06-20 DIAGNOSIS — Z6841 Body Mass Index (BMI) 40.0 and over, adult: Secondary | ICD-10-CM

## 2012-06-20 DIAGNOSIS — D72829 Elevated white blood cell count, unspecified: Secondary | ICD-10-CM | POA: Diagnosis present

## 2012-06-20 DIAGNOSIS — K8010 Calculus of gallbladder with chronic cholecystitis without obstruction: Principal | ICD-10-CM | POA: Diagnosis present

## 2012-06-20 DIAGNOSIS — F329 Major depressive disorder, single episode, unspecified: Secondary | ICD-10-CM | POA: Diagnosis present

## 2012-06-20 DIAGNOSIS — I10 Essential (primary) hypertension: Secondary | ICD-10-CM | POA: Diagnosis present

## 2012-06-20 DIAGNOSIS — F3289 Other specified depressive episodes: Secondary | ICD-10-CM | POA: Diagnosis present

## 2012-06-20 DIAGNOSIS — K8020 Calculus of gallbladder without cholecystitis without obstruction: Secondary | ICD-10-CM

## 2012-06-20 DIAGNOSIS — N63 Unspecified lump in unspecified breast: Secondary | ICD-10-CM | POA: Diagnosis present

## 2012-06-20 DIAGNOSIS — K8050 Calculus of bile duct without cholangitis or cholecystitis without obstruction: Secondary | ICD-10-CM

## 2012-06-20 HISTORY — DX: Low back pain, unspecified: M54.50

## 2012-06-20 HISTORY — DX: Calculus of gallbladder without cholecystitis without obstruction: K80.20

## 2012-06-20 HISTORY — DX: Low back pain: M54.5

## 2012-06-20 HISTORY — DX: Sciatica, unspecified side: M54.30

## 2012-06-20 LAB — CBC WITH DIFFERENTIAL/PLATELET
Basophils Absolute: 0.1 10*3/uL (ref 0.0–0.1)
HCT: 40.8 % (ref 36.0–46.0)
Lymphocytes Relative: 13 % (ref 12–46)
Monocytes Absolute: 0.5 10*3/uL (ref 0.1–1.0)
Neutro Abs: 6.6 10*3/uL (ref 1.7–7.7)
RDW: 13.4 % (ref 11.5–15.5)
WBC: 8.6 10*3/uL (ref 4.0–10.5)

## 2012-06-20 LAB — COMPREHENSIVE METABOLIC PANEL
ALT: 19 U/L (ref 0–35)
AST: 17 U/L (ref 0–37)
Alkaline Phosphatase: 55 U/L (ref 39–117)
CO2: 26 mEq/L (ref 19–32)
Chloride: 103 mEq/L (ref 96–112)
GFR calc non Af Amer: >90 mL/min (ref >90)
Sodium: 137 mEq/L (ref 135–145)
Total Bilirubin: 0.2 mg/dL — ABNORMAL LOW (ref 0.3–1.2)

## 2012-06-20 LAB — URINALYSIS, ROUTINE W REFLEX MICROSCOPIC
Bilirubin Urine: NEGATIVE
Glucose, UA: NEGATIVE mg/dL
Protein, ur: NEGATIVE mg/dL

## 2012-06-20 LAB — PREGNANCY, URINE: Preg Test, Ur: NEGATIVE

## 2012-06-20 MED ORDER — PANTOPRAZOLE SODIUM 40 MG IV SOLR
40.0000 mg | Freq: Every day | INTRAVENOUS | Status: DC
  Administered 2012-06-20 – 2012-06-22 (×3): 40 mg via INTRAVENOUS
  Filled 2012-06-20 (×2): qty 40

## 2012-06-20 MED ORDER — MORPHINE SULFATE 4 MG/ML IJ SOLN
4.0000 mg | INTRAMUSCULAR | Status: AC | PRN
  Administered 2012-06-20 (×2): 4 mg via INTRAVENOUS
  Filled 2012-06-20 (×2): qty 1

## 2012-06-20 MED ORDER — ONDANSETRON HCL 4 MG/2ML IJ SOLN
4.0000 mg | INTRAMUSCULAR | Status: AC | PRN
  Administered 2012-06-20 (×2): 4 mg via INTRAVENOUS
  Filled 2012-06-20 (×2): qty 2

## 2012-06-20 MED ORDER — ENOXAPARIN SODIUM 40 MG/0.4ML ~~LOC~~ SOLN
40.0000 mg | SUBCUTANEOUS | Status: DC
  Administered 2012-06-21 – 2012-06-22 (×2): 40 mg via SUBCUTANEOUS
  Filled 2012-06-20 (×2): qty 0.4

## 2012-06-20 MED ORDER — DIPHENHYDRAMINE HCL 12.5 MG/5ML PO ELIX: 12.5000 mg | ORAL_SOLUTION | Freq: Four times a day (QID) | ORAL | Status: DC | PRN

## 2012-06-20 MED ORDER — FAMOTIDINE IN NACL 20-0.9 MG/50ML-% IV SOLN
20.0000 mg | Freq: Once | INTRAVENOUS | Status: AC
  Administered 2012-06-20: 20 mg via INTRAVENOUS
  Filled 2012-06-20: qty 50

## 2012-06-20 MED ORDER — IOHEXOL 300 MG/ML  SOLN
100.0000 mL | Freq: Once | INTRAMUSCULAR | Status: AC | PRN
  Administered 2012-06-20: 100 mL via INTRAVENOUS

## 2012-06-20 MED ORDER — SODIUM CHLORIDE 0.9 % IV SOLN
INTRAVENOUS | Status: DC
  Administered 2012-06-20: 14:00:00 via INTRAVENOUS

## 2012-06-20 MED ORDER — ACETAMINOPHEN 325 MG PO TABS
650.0000 mg | ORAL_TABLET | Freq: Four times a day (QID) | ORAL | Status: DC | PRN
  Administered 2012-06-21: 650 mg via ORAL
  Filled 2012-06-20: qty 2

## 2012-06-20 MED ORDER — PANTOPRAZOLE SODIUM 40 MG IV SOLR
INTRAVENOUS | Status: AC
  Administered 2012-06-20: 40 mg
  Filled 2012-06-20: qty 40

## 2012-06-20 MED ORDER — ONDANSETRON HCL 4 MG/2ML IJ SOLN
4.0000 mg | Freq: Four times a day (QID) | INTRAMUSCULAR | Status: DC | PRN
  Administered 2012-06-22: 4 mg via INTRAVENOUS
  Filled 2012-06-20: qty 2

## 2012-06-20 MED ORDER — DIPHENHYDRAMINE HCL 50 MG/ML IJ SOLN: 12.5000 mg | Freq: Four times a day (QID) | INTRAMUSCULAR | Status: DC | PRN

## 2012-06-20 MED ORDER — HYDROMORPHONE HCL PF 1 MG/ML IJ SOLN
INTRAMUSCULAR | Status: AC
  Administered 2012-06-20: 1 mg
  Filled 2012-06-20: qty 1

## 2012-06-20 MED ORDER — LACTATED RINGERS IV SOLN
INTRAVENOUS | Status: DC
  Administered 2012-06-20 – 2012-06-23 (×4): via INTRAVENOUS

## 2012-06-20 MED ORDER — ENOXAPARIN SODIUM 40 MG/0.4ML ~~LOC~~ SOLN
SUBCUTANEOUS | Status: AC
  Filled 2012-06-20: qty 0.4

## 2012-06-20 MED ORDER — HYDROMORPHONE HCL PF 1 MG/ML IJ SOLN
1.0000 mg | Freq: Once | INTRAMUSCULAR | Status: AC
  Administered 2012-06-20: 1 mg via INTRAVENOUS
  Filled 2012-06-20: qty 1

## 2012-06-20 MED ORDER — HYDROMORPHONE HCL PF 1 MG/ML IJ SOLN: 1.0000 mg | INTRAMUSCULAR | Status: DC | PRN

## 2012-06-20 MED ORDER — ACETAMINOPHEN 650 MG RE SUPP: 650.0000 mg | Freq: Four times a day (QID) | RECTAL | Status: DC | PRN

## 2012-06-20 NOTE — ED Provider Notes (Signed)
History     CSN: [id number]  Arrival date & time 06/20/12  1209   First MD Initiated Contact with Patient 06/20/12 1321      Chief Complaint  Patient presents with  . Abdominal Pain     HPI Pt was seen at 1345.  Per pt, c/o gradual onset and persistence of constant upper abd "pain" since she woke up this morning.  Pt states the pain radiates around to her back, and has been assoc with multiple episodes of N/V. States she has had similar pain previously and ws told it was "my gallbladder."  Last meal was last night (eggrolls and soup).  Denies diarrhea, no black or blood in stools or emesis, no fevers, no CP.     Past Medical History  Diagnosis Date  . UTI (urinary tract infection)   . HTN (hypertension)   . Lump or mass in breast   . Leukocytosis   . Hyperglycemia   . Malaise and fatigue   . Morbid obesity   . Sleep apnea   . Depressed   . Low back pain   . Sciatica   . Gallstones     History reviewed. No pertinent past surgical history.  Family History  Problem Relation Age of Onset  . Diabetes Mother   . Rheumatic fever Mother 26  . Migraines Father 53  . Hypertension Father   . Autism Brother     History  Substance Use Topics  . Smoking status: Never Smoker   . Smokeless tobacco: Not on file  . Alcohol Use: No    Review of Systems ROS: Statement: All systems negative except as marked or noted in the HPI; Constitutional: Negative for fever and chills. ; ; Eyes: Negative for eye pain, redness and discharge. ; ; ENMT: Negative for ear pain, hoarseness, nasal congestion, sinus pressure and sore throat. ; ; Cardiovascular: Negative for chest pain, palpitations, diaphoresis, dyspnea and peripheral edema. ; ; Respiratory: Negative for cough, wheezing and stridor. ; ; Gastrointestinal: +N/V, abd pain. Negative for diarrhea, blood in stool, hematemesis, jaundice and rectal bleeding. . ; ; Genitourinary: Negative for dysuria, flank pain and hematuria. ; ;  Musculoskeletal: Negative for back pain and neck pain. Negative for swelling and trauma.; ; Skin: Negative for pruritus, rash, abrasions, blisters, bruising and skin lesion.; ; Neuro: Negative for headache, lightheadedness and neck stiffness. Negative for weakness, altered level of consciousness , altered mental status, extremity weakness, paresthesias, involuntary movement, seizure and syncope.       Allergies  Amoxicillin; Penicillins; and Latex  Home Medications   Current Outpatient Rx  Name Route Sig Dispense Refill  . HYDROCODONE-ACETAMINOPHEN 5-325 MG PO TABS Oral Take 1 tablet by mouth every 6 (six) hours as needed. For pain    . PANTOPRAZOLE SODIUM 20 MG PO TBEC Oral Take 20 mg by mouth daily as needed. For acid reflux      BP 110/60  Pulse 70  Temp 98.1 F (36.7 C)  Resp 20  Ht 5\' 6"  (1.676 m)  Wt 260 lb (117.935 kg)  BMI 41.97 kg/m2  SpO2 98%  LMP 06/07/2012  Physical Exam 1350: Physical examination:  Nursing notes reviewed; Vital signs and O2 SAT reviewed;  Constitutional: Well developed, Well nourished, Well hydrated, In no acute distress; Head:  Normocephalic, atraumatic; Eyes: EOMI, PERRL, No scleral icterus; ENMT: Mouth and pharynx normal, Mucous membranes moist; Neck: Supple, Full range of motion, No lymphadenopathy; Cardiovascular: Regular rate and rhythm, No murmur, rub, or gallop;  Respiratory: Breath sounds clear & equal bilaterally, No rales, rhonchi, wheezes.  Speaking full sentences with ease, Normal respiratory effort/excursion; Chest: Nontender, Movement normal; Abdomen: Soft, +RUQ and mid-epigastric areas tender to palp. No rebound or guarding. Nondistended, Normal bowel sounds; Genitourinary: No CVA tenderness; Extremities: Pulses normal, No tenderness, No edema, No calf edema or asymmetry.; Neuro: AA&Ox3, Major CN grossly intact.  Speech clear. No gross focal motor or sensory deficits in extremities.; Skin: Color normal, Warm, Dry.   ED Course  Procedures       MDM  MDM Reviewed: previous chart, nursing note and vitals Reviewed previous: labs Interpretation: labs and CT scan     Results for orders placed during the hospital encounter of 06/20/12  URINALYSIS, ROUTINE W REFLEX MICROSCOPIC      Component Value Range   Color, Urine YELLOW  YELLOW   APPearance CLEAR  CLEAR   Specific Gravity, Urine 1.015  1.005 - 1.030   pH 8.0  5.0 - 8.0   Glucose, UA NEGATIVE  NEGATIVE mg/dL   Hgb urine dipstick TRACE (*) NEGATIVE   Bilirubin Urine NEGATIVE  NEGATIVE   Ketones, ur NEGATIVE  NEGATIVE mg/dL   Protein, ur NEGATIVE  NEGATIVE mg/dL   Urobilinogen, UA 0.2  0.0 - 1.0 mg/dL   Nitrite NEGATIVE  NEGATIVE   Leukocytes, UA NEGATIVE  NEGATIVE  PREGNANCY, URINE      Component Value Range   Preg Test, Ur NEGATIVE  NEGATIVE  CBC WITH DIFFERENTIAL      Component Value Range   WBC 8.6  4.0 - 10.5 K/uL   RBC 4.81  3.87 - 5.11 MIL/uL   Hemoglobin 13.4  12.0 - 15.0 g/dL   HCT 45.4  09.8 - 11.9 %   MCV 84.8  78.0 - 100.0 fL   MCH 27.9  26.0 - 34.0 pg   MCHC 32.8  30.0 - 36.0 g/dL   RDW 14.7  82.9 - 56.2 %   Platelets 297  150 - 400 K/uL   Neutrophils Relative 77  43 - 77 %   Neutro Abs 6.6  1.7 - 7.7 K/uL   Lymphocytes Relative 13  12 - 46 %   Lymphs Abs 1.1  0.7 - 4.0 K/uL   Monocytes Relative 6  3 - 12 %   Monocytes Absolute 0.5  0.1 - 1.0 K/uL   Eosinophils Relative 4  0 - 5 %   Eosinophils Absolute 0.3  0.0 - 0.7 K/uL   Basophils Relative 1  0 - 1 %   Basophils Absolute 0.1  0.0 - 0.1 K/uL  COMPREHENSIVE METABOLIC PANEL      Component Value Range   Sodium 137  135 - 145 mEq/L   Potassium 4.1  3.5 - 5.1 mEq/L   Chloride 103  96 - 112 mEq/L   CO2 26  19 - 32 mEq/L   Glucose, Bld 132 (*) 70 - 99 mg/dL   BUN 13  6 - 23 mg/dL   Creatinine, Ser 1.30  0.50 - 1.10 mg/dL   Calcium 9.1  8.4 - 86.5 mg/dL   Total Protein 7.8  6.0 - 8.3 g/dL   Albumin 3.6  3.5 - 5.2 g/dL   AST 17  0 - 37 U/L   ALT 19  0 - 35 U/L   Alkaline Phosphatase 55   39 - 117 U/L   Total Bilirubin 0.2 (*) 0.3 - 1.2 mg/dL   GFR calc non Af Amer >90  >90 mL/min  GFR calc Af Amer >90  >90 mL/min  LIPASE, BLOOD      Component Value Range   Lipase 22  11 - 59 U/L  URINE MICROSCOPIC-ADD ON      Component Value Range   Squamous Epithelial / LPF RARE  RARE   WBC, UA 0-2  <3 WBC/hpf   RBC / HPF 0-2  <3 RBC/hpf   Bacteria, UA RARE  RARE   Ct Abdomen Pelvis W Contrast 06/20/2012  *RADIOLOGY REPORT*  Clinical Data: Abdominal pain  CT ABDOMEN AND PELVIS WITH CONTRAST  Technique:  Multidetector CT imaging of the abdomen and pelvis was performed following the standard protocol during bolus administration of intravenous contrast.  Contrast: OMNIPAQUE IOHEXOL 300 MG/ML  SOLN  Comparison: None.  Findings: The lung bases are clear. No pericardial or pleural effusion.  Heart size is normal.  There is no suspicious liver abnormality.  Several small stones are noted within the neck of the gallbladder.  There is no secondary signs of acute cholecystitis.  No biliary dilatation.  The pancreas is unremarkable.  Normal appearance of the spleen.  Both adrenal glands appear normal.  The right kidney is normal. The left kidney is normal.  The urinary bladder is unremarkable. The uterus and adnexal structures is unremarkable.  No enlarged upper abdominal lymph nodes.  There is no pelvic or inguinal adenopathy.  No ascites within the abdomen or the pelvis.  The stomach is normal.  The small bowel loops have a normal course and caliber. The appendix is visualized and appears normal.  Colon is negative.  Review of the visualized osseous structures is unremarkable.  IMPRESSION:  1.  No acute findings identified within the abdomen or pelvis. 2.  Gallstones.   Original Report Authenticated By: Signa Kell, M.D.       2100:  Pt continues to c/o N/V and abd pain despite multiple doses of IV pain meds and anti-emetics.  No acute cholecystitis on CT scan.  Dx and testing d/w pt.  Questions  answered.  Verb understanding, agreeable to admit.  T/C to General Surgery Dr. Lovell Sheehan, case discussed, including:  HPI, pertinent PM/SHx, VS/PE, dx testing, ED course and treatment:  Agreeable to observation admit.       Laray Anger, DO 06/22/12 1144

## 2012-06-20 NOTE — ED Notes (Addendum)
Pt c/o right upper abd pain that radiates around to back area, n/v, pt states that she ate eggrolls and soup last night for supper, has not been able to eat today, does admit to sob, pt states that the pain is making is hard for her to breathe. Pt states that she has had pain like this before and thinks that it may be related to her gallbladder.

## 2012-06-21 LAB — SURGICAL PCR SCREEN
MRSA, PCR: NEGATIVE
Staphylococcus aureus: NEGATIVE

## 2012-06-21 MED ORDER — MORPHINE BOLUS VIA INFUSION: 4.0000 mg | INTRAVENOUS | Status: DC | PRN

## 2012-06-21 MED ORDER — VANCOMYCIN HCL 1000 MG IV SOLR
1500.0000 mg | INTRAVENOUS | Status: AC
  Administered 2012-06-22: 1500 mg via INTRAVENOUS
  Filled 2012-06-21: qty 1500

## 2012-06-21 MED ORDER — MORPHINE SULFATE 4 MG/ML IJ SOLN
4.0000 mg | INTRAMUSCULAR | Status: DC | PRN
  Administered 2012-06-22 (×2): 4 mg via INTRAVENOUS
  Filled 2012-06-21 (×2): qty 1

## 2012-06-21 MED ORDER — SODIUM CHLORIDE 0.9 % IJ SOLN
INTRAMUSCULAR | Status: AC
  Administered 2012-06-21: 20:00:00
  Filled 2012-06-21: qty 3

## 2012-06-21 MED ORDER — ONDANSETRON HCL 4 MG/2ML IJ SOLN
INTRAMUSCULAR | Status: AC
  Filled 2012-06-21: qty 2

## 2012-06-21 NOTE — H&P (Signed)
Julie Berry is an 31 y.o. female.   Chief Complaint: Right upper quadrant abdominal pain HPI: Patient is a 31 year old morbidly obese black female who presented to the emergency room yesterday with right upper quadrant abdominal pain, nausea, and vomiting. She has a known history of gallstones. CT scan of the abdomen and pelvis revealed cholelithiasis. She had seen Dr. Leticia Penna approximately one year ago with biliary colic, but deferred surgery at that time. This time in the emergency room, the physicians could not get her pain and nausea under control. She was thus admitted to the hospital for control of her pain and nausea. Past Medical History  Diagnosis Date  . UTI (urinary tract infection)   . HTN (hypertension)   . Lump or mass in breast   . Leukocytosis   . Hyperglycemia   . Malaise and fatigue   . Morbid obesity   . Sleep apnea   . Depressed   . Low back pain   . Sciatica   . Gallstones     History reviewed. No pertinent past surgical history.  Family History  Problem Relation Age of Onset  . Diabetes Mother   . Rheumatic fever Mother 71  . Migraines Father 40  . Hypertension Father   . Autism Brother    Social History:  reports that she has never smoked. She does not have any smokeless tobacco history on file. She reports that she does not drink alcohol or use illicit drugs.  Allergies:  Allergies  Allergen Reactions  . Amoxicillin Anaphylaxis  . Penicillins Anaphylaxis  . Latex Rash    Medications Prior to Admission  Medication Sig Dispense Refill  . HYDROcodone-acetaminophen (NORCO/VICODIN) 5-325 MG per tablet Take 1 tablet by mouth every 6 (six) hours as needed. For pain      . pantoprazole (PROTONIX) 20 MG tablet Take 20 mg by mouth daily as needed. For acid reflux        Results for orders placed during the hospital encounter of 06/20/12 (from the past 48 hour(s))  URINALYSIS, ROUTINE W REFLEX MICROSCOPIC     Status: Abnormal   Collection Time   06/20/12  1:36 PM      Component Value Range Comment   Color, Urine YELLOW  YELLOW    APPearance CLEAR  CLEAR    Specific Gravity, Urine 1.015  1.005 - 1.030    pH 8.0  5.0 - 8.0    Glucose, UA NEGATIVE  NEGATIVE mg/dL    Hgb urine dipstick TRACE (*) NEGATIVE    Bilirubin Urine NEGATIVE  NEGATIVE    Ketones, ur NEGATIVE  NEGATIVE mg/dL    Protein, ur NEGATIVE  NEGATIVE mg/dL    Urobilinogen, UA 0.2  0.0 - 1.0 mg/dL    Nitrite NEGATIVE  NEGATIVE    Leukocytes, UA NEGATIVE  NEGATIVE   PREGNANCY, URINE     Status: Normal   Collection Time   06/20/12  1:36 PM      Component Value Range Comment   Preg Test, Ur NEGATIVE  NEGATIVE   URINE MICROSCOPIC-ADD ON     Status: Normal   Collection Time   06/20/12  1:36 PM      Component Value Range Comment   Squamous Epithelial / LPF RARE  RARE    WBC, UA 0-2  <3 WBC/hpf    RBC / HPF 0-2  <3 RBC/hpf    Bacteria, UA RARE  RARE   CBC WITH DIFFERENTIAL     Status: Normal  Collection Time   06/20/12  2:11 PM      Component Value Range Comment   WBC 8.6  4.0 - 10.5 K/uL    RBC 4.81  3.87 - 5.11 MIL/uL    Hemoglobin 13.4  12.0 - 15.0 g/dL    HCT 16.1  09.6 - 04.5 %    MCV 84.8  78.0 - 100.0 fL    MCH 27.9  26.0 - 34.0 pg    MCHC 32.8  30.0 - 36.0 g/dL    RDW 40.9  81.1 - 91.4 %    Platelets 297  150 - 400 K/uL    Neutrophils Relative 77  43 - 77 %    Neutro Abs 6.6  1.7 - 7.7 K/uL    Lymphocytes Relative 13  12 - 46 %    Lymphs Abs 1.1  0.7 - 4.0 K/uL    Monocytes Relative 6  3 - 12 %    Monocytes Absolute 0.5  0.1 - 1.0 K/uL    Eosinophils Relative 4  0 - 5 %    Eosinophils Absolute 0.3  0.0 - 0.7 K/uL    Basophils Relative 1  0 - 1 %    Basophils Absolute 0.1  0.0 - 0.1 K/uL   COMPREHENSIVE METABOLIC PANEL     Status: Abnormal   Collection Time   06/20/12  2:11 PM      Component Value Range Comment   Sodium 137  135 - 145 mEq/L    Potassium 4.1  3.5 - 5.1 mEq/L    Chloride 103  96 - 112 mEq/L    CO2 26  19 - 32 mEq/L    Glucose, Bld  132 (*) 70 - 99 mg/dL    BUN 13  6 - 23 mg/dL    Creatinine, Ser 7.82  0.50 - 1.10 mg/dL    Calcium 9.1  8.4 - 95.6 mg/dL    Total Protein 7.8  6.0 - 8.3 g/dL    Albumin 3.6  3.5 - 5.2 g/dL    AST 17  0 - 37 U/L    ALT 19  0 - 35 U/L    Alkaline Phosphatase 55  39 - 117 U/L    Total Bilirubin 0.2 (*) 0.3 - 1.2 mg/dL    GFR calc non Af Amer >90  >90 mL/min    GFR calc Af Amer >90  >90 mL/min   LIPASE, BLOOD     Status: Normal   Collection Time   06/20/12  2:11 PM      Component Value Range Comment   Lipase 22  11 - 59 U/L    Ct Abdomen Pelvis W Contrast  06/20/2012  *RADIOLOGY REPORT*  Clinical Data: Abdominal pain  CT ABDOMEN AND PELVIS WITH CONTRAST  Technique:  Multidetector CT imaging of the abdomen and pelvis was performed following the standard protocol during bolus administration of intravenous contrast.  Contrast: OMNIPAQUE IOHEXOL 300 MG/ML  SOLN  Comparison: None.  Findings: The lung bases are clear. No pericardial or pleural effusion.  Heart size is normal.  There is no suspicious liver abnormality.  Several small stones are noted within the neck of the gallbladder.  There is no secondary signs of acute cholecystitis.  No biliary dilatation.  The pancreas is unremarkable.  Normal appearance of the spleen.  Both adrenal glands appear normal.  The right kidney is normal. The left kidney is normal.  The urinary bladder is unremarkable. The uterus and adnexal structures  is unremarkable.  No enlarged upper abdominal lymph nodes.  There is no pelvic or inguinal adenopathy.  No ascites within the abdomen or the pelvis.  The stomach is normal.  The small bowel loops have a normal course and caliber. The appendix is visualized and appears normal.  Colon is negative.  Review of the visualized osseous structures is unremarkable.  IMPRESSION:  1.  No acute findings identified within the abdomen or pelvis. 2.  Gallstones.   Original Report Authenticated By: Signa Kell, M.D.     Review of  Systems  Constitutional: Positive for malaise/fatigue.  HENT: Negative.   Eyes: Negative.   Respiratory: Negative.   Cardiovascular: Negative.   Gastrointestinal: Positive for nausea, vomiting and abdominal pain.  Genitourinary: Negative.   Musculoskeletal: Negative.   Skin: Negative.   Neurological: Negative.   Endo/Heme/Allergies: Negative.     Blood pressure 111/75, pulse 67, temperature 97.8 F (36.6 C), temperature source Oral, resp. rate 20, height 5\' 6"  (1.676 m), weight 124.376 kg (274 lb 3.2 oz), last menstrual period 06/07/2012, SpO2 99.00%. Physical Exam  Constitutional: She is oriented to person, place, and time. She appears well-developed and well-nourished.  HENT:  Head: Normocephalic and atraumatic.  Eyes: No scleral icterus.  Neck: Normal range of motion. Neck supple.  Cardiovascular: Normal rate, regular rhythm and normal heart sounds.   Respiratory: Breath sounds normal.  GI: Soft. Bowel sounds are normal. There is tenderness.       Tender in right upper quadrant to deep palpation. No rigidity noted.  Neurological: She is alert and oriented to person, place, and time.  Skin: Skin is warm and dry.     Assessment/Plan Impression: Cholecystitis, cholelithiasis. Plan: Continue controlling her nausea and vomiting. She subsequently will undergo a laparoscopic cholecystectomy on 06/22/2012. The risks and benefits of the procedure including bleeding, infection, hepatobiliary injury, and the possibility of an open procedure were fully explained to the patient, gave informed consent.  Prithvi Kooi A 06/21/2012, 9:13 AM

## 2012-06-22 ENCOUNTER — Encounter (HOSPITAL_COMMUNITY)
Admission: EM | Disposition: A | Discharge status: Home / Self Care | Payer: Self-pay | Source: Home / Self Care | Attending: General Surgery

## 2012-06-22 ENCOUNTER — Inpatient Hospital Stay (HOSPITAL_COMMUNITY): Payer: Self-pay | Admitting: Anesthesiology

## 2012-06-22 ENCOUNTER — Encounter (HOSPITAL_COMMUNITY): Payer: Self-pay | Admitting: Anesthesiology

## 2012-06-22 HISTORY — PX: CHOLECYSTECTOMY: SHX55

## 2012-06-22 SURGERY — LAPAROSCOPIC CHOLECYSTECTOMY
Anesthesia: General | Site: Abdomen | Wound class: Contaminated

## 2012-06-22 MED ORDER — MIDAZOLAM HCL 2 MG/2ML IJ SOLN
INTRAMUSCULAR | Status: AC
  Filled 2012-06-22: qty 2

## 2012-06-22 MED ORDER — SODIUM CHLORIDE 0.9 % IJ SOLN
INTRAMUSCULAR | Status: AC
  Administered 2012-06-22: 10 mL
  Filled 2012-06-22: qty 3

## 2012-06-22 MED ORDER — NEOSTIGMINE METHYLSULFATE 1 MG/ML IJ SOLN
INTRAMUSCULAR | Status: AC
  Filled 2012-06-22: qty 10

## 2012-06-22 MED ORDER — SUCCINYLCHOLINE CHLORIDE 20 MG/ML IJ SOLN
INTRAMUSCULAR | Status: DC | PRN
  Administered 2012-06-22: 150 mg via INTRAVENOUS

## 2012-06-22 MED ORDER — MIDAZOLAM HCL 5 MG/5ML IJ SOLN
INTRAMUSCULAR | Status: DC | PRN
  Administered 2012-06-22 (×2): 2 mg via INTRAVENOUS

## 2012-06-22 MED ORDER — PROPOFOL 10 MG/ML IV EMUL
INTRAVENOUS | Status: AC
  Filled 2012-06-22: qty 20

## 2012-06-22 MED ORDER — ONDANSETRON HCL 4 MG PO TABS: 4.0000 mg | ORAL_TABLET | Freq: Four times a day (QID) | ORAL | Status: DC | PRN

## 2012-06-22 MED ORDER — ONDANSETRON HCL 4 MG/2ML IJ SOLN: 4.0000 mg | Freq: Once | INTRAMUSCULAR | Status: DC | PRN

## 2012-06-22 MED ORDER — LIDOCAINE HCL (PF) 1 % IJ SOLN
INTRAMUSCULAR | Status: AC
  Filled 2012-06-22: qty 5

## 2012-06-22 MED ORDER — ONDANSETRON HCL 4 MG/2ML IJ SOLN
INTRAMUSCULAR | Status: AC
  Filled 2012-06-22: qty 2

## 2012-06-22 MED ORDER — FENTANYL CITRATE 0.05 MG/ML IJ SOLN: 25.0000 ug | INTRAMUSCULAR | Status: DC | PRN

## 2012-06-22 MED ORDER — ROCURONIUM BROMIDE 50 MG/5ML IV SOLN
INTRAVENOUS | Status: AC
  Filled 2012-06-22: qty 1

## 2012-06-22 MED ORDER — ACETAMINOPHEN 10 MG/ML IV SOLN
1000.0000 mg | Freq: Four times a day (QID) | INTRAVENOUS | Status: AC
  Administered 2012-06-22 – 2012-06-23 (×4): 1000 mg via INTRAVENOUS
  Filled 2012-06-22 (×3): qty 100

## 2012-06-22 MED ORDER — NEOSTIGMINE METHYLSULFATE 1 MG/ML IJ SOLN
INTRAMUSCULAR | Status: DC | PRN
  Administered 2012-06-22: 3 mg via INTRAVENOUS

## 2012-06-22 MED ORDER — ROCURONIUM BROMIDE 100 MG/10ML IV SOLN
INTRAVENOUS | Status: DC | PRN
  Administered 2012-06-22: 30 mg via INTRAVENOUS

## 2012-06-22 MED ORDER — SUCCINYLCHOLINE CHLORIDE 20 MG/ML IJ SOLN
INTRAMUSCULAR | Status: AC
  Filled 2012-06-22: qty 1

## 2012-06-22 MED ORDER — GLYCOPYRROLATE 0.2 MG/ML IJ SOLN
INTRAMUSCULAR | Status: AC
  Filled 2012-06-22: qty 2

## 2012-06-22 MED ORDER — ONDANSETRON HCL 4 MG/2ML IJ SOLN
4.0000 mg | Freq: Once | INTRAMUSCULAR | Status: AC
  Administered 2012-06-22: 4 mg via INTRAVENOUS

## 2012-06-22 MED ORDER — PROPOFOL 10 MG/ML IV BOLUS
INTRAVENOUS | Status: DC | PRN
  Administered 2012-06-22: 170 mg via INTRAVENOUS

## 2012-06-22 MED ORDER — MIDAZOLAM HCL 2 MG/2ML IJ SOLN
1.0000 mg | INTRAMUSCULAR | Status: DC | PRN
  Administered 2012-06-22: 2 mg via INTRAVENOUS

## 2012-06-22 MED ORDER — BUPIVACAINE HCL 0.5 % IJ SOLN
INTRAMUSCULAR | Status: DC | PRN
  Administered 2012-06-22: 10 mL

## 2012-06-22 MED ORDER — GLYCOPYRROLATE 0.2 MG/ML IJ SOLN
INTRAMUSCULAR | Status: DC | PRN
  Administered 2012-06-22: 0.4 mg via INTRAVENOUS

## 2012-06-22 MED ORDER — FENTANYL CITRATE 0.05 MG/ML IJ SOLN
INTRAMUSCULAR | Status: DC | PRN
  Administered 2012-06-22: 50 ug via INTRAVENOUS
  Administered 2012-06-22: 100 ug via INTRAVENOUS
  Administered 2012-06-22 (×2): 50 ug via INTRAVENOUS

## 2012-06-22 MED ORDER — FENTANYL CITRATE 0.05 MG/ML IJ SOLN
INTRAMUSCULAR | Status: AC
  Filled 2012-06-22: qty 5

## 2012-06-22 MED ORDER — FENTANYL CITRATE 0.05 MG/ML IJ SOLN
INTRAMUSCULAR | Status: AC
  Filled 2012-06-22: qty 2

## 2012-06-22 MED ORDER — HEMOSTATIC AGENTS (NO CHARGE) OPTIME
TOPICAL | Status: DC | PRN
  Administered 2012-06-22: 1 via TOPICAL

## 2012-06-22 MED ORDER — ACETAMINOPHEN 10 MG/ML IV SOLN
INTRAVENOUS | Status: AC
  Filled 2012-06-22: qty 100

## 2012-06-22 MED ORDER — SODIUM CHLORIDE 0.9 % IR SOLN
Status: DC | PRN
  Administered 2012-06-22: 1000 mL

## 2012-06-22 MED ORDER — BUPIVACAINE HCL (PF) 0.5 % IJ SOLN
INTRAMUSCULAR | Status: AC
  Filled 2012-06-22: qty 30

## 2012-06-22 MED ORDER — LIDOCAINE HCL 1 % IJ SOLN
INTRAMUSCULAR | Status: DC | PRN
  Administered 2012-06-22: 50 mg via INTRADERMAL

## 2012-06-22 MED ORDER — LACTATED RINGERS IV SOLN: INTRAVENOUS | Status: DC

## 2012-06-22 MED ORDER — VANCOMYCIN HCL 1000 MG IV SOLR
1000.0000 mg | INTRAVENOUS | Status: DC | PRN
  Administered 2012-06-22: 1500 mg via INTRAVENOUS

## 2012-06-22 SURGICAL SUPPLY — 36 items
APPLIER CLIP LAPSCP 10X32 DD (CLIP) ×2 IMPLANT
BAG HAMPER (MISCELLANEOUS) ×2 IMPLANT
CLOTH BEACON ORANGE TIMEOUT ST (SAFETY) ×2 IMPLANT
COVER LIGHT HANDLE STERIS (MISCELLANEOUS) ×4 IMPLANT
CUTTER LINEAR ENDO 35 ETS TH (STAPLE) ×2 IMPLANT
DECANTER SPIKE VIAL GLASS SM (MISCELLANEOUS) ×2 IMPLANT
DURAPREP 26ML APPLICATOR (WOUND CARE) ×2 IMPLANT
ELECT REM PT RETURN 9FT ADLT (ELECTROSURGICAL) ×2
ELECTRODE REM PT RTRN 9FT ADLT (ELECTROSURGICAL) ×1 IMPLANT
FILTER SMOKE EVAC LAPAROSHD (FILTER) ×2 IMPLANT
FORMALIN 10 PREFIL 120ML (MISCELLANEOUS) ×2 IMPLANT
GLOVE BIO SURGEON STRL SZ7.5 (GLOVE) ×2 IMPLANT
GLOVE BIOGEL PI IND STRL 7.0 (GLOVE) ×2 IMPLANT
GLOVE BIOGEL PI INDICATOR 7.0 (GLOVE) ×2
GLOVE SKINSENSE NS SZ6.5 (GLOVE) ×2
GLOVE SKINSENSE NS SZ7.5 (GLOVE) ×1
GLOVE SKINSENSE STRL SZ6.5 (GLOVE) ×2 IMPLANT
GLOVE SKINSENSE STRL SZ7.5 (GLOVE) ×1 IMPLANT
GOWN STRL REIN XL XLG (GOWN DISPOSABLE) ×6 IMPLANT
HEMOSTAT SNOW SURGICEL 2X4 (HEMOSTASIS) ×2 IMPLANT
INST SET LAPROSCOPIC AP (KITS) ×2 IMPLANT
KIT ROOM TURNOVER APOR (KITS) ×2 IMPLANT
KIT TROCAR LAP CHOLE (TROCAR) ×2 IMPLANT
MANIFOLD NEPTUNE II (INSTRUMENTS) ×2 IMPLANT
NS IRRIG 1000ML POUR BTL (IV SOLUTION) ×2 IMPLANT
PACK LAP CHOLE LZT030E (CUSTOM PROCEDURE TRAY) ×2 IMPLANT
PAD ARMBOARD 7.5X6 YLW CONV (MISCELLANEOUS) ×2 IMPLANT
POUCH SPECIMEN RETRIEVAL 10MM (ENDOMECHANICALS) ×2 IMPLANT
SET BASIN LINEN APH (SET/KITS/TRAYS/PACK) ×2 IMPLANT
SPONGE GAUZE 2X2 8PLY STRL LF (GAUZE/BANDAGES/DRESSINGS) ×8 IMPLANT
SPONGE GAUZE 4X4 12PLY (GAUZE/BANDAGES/DRESSINGS) ×2 IMPLANT
STAPLER VISISTAT (STAPLE) ×2 IMPLANT
SUT VICRYL 0 UR6 27IN ABS (SUTURE) ×2 IMPLANT
TAPE CLOTH SURG 4X10 WHT LF (GAUZE/BANDAGES/DRESSINGS) ×2 IMPLANT
WARMER LAPAROSCOPE (MISCELLANEOUS) ×2 IMPLANT
YANKAUER SUCT 12FT TUBE ARGYLE (SUCTIONS) ×2 IMPLANT

## 2012-06-22 NOTE — Transfer of Care (Signed)
Immediate Anesthesia Transfer of Care Note  Patient: Julie Berry  Procedure(s) Performed: Procedure(s) (LRB) with comments: LAPAROSCOPIC CHOLECYSTECTOMY (N/A)  Patient Location: PACU  Anesthesia Type:General  Level of Consciousness: awake and patient cooperative  Airway & Oxygen Therapy: Patient Spontanous Breathing and Patient connected to face mask oxygen  Post-op Assessment: Report given to PACU RN, Post -op Vital signs reviewed and stable and Patient moving all extremities  Post vital signs: Reviewed and stable  Complications: No apparent anesthesia complications

## 2012-06-22 NOTE — Progress Notes (Signed)
06/22/12 1820 Patient ambulated hallway with nurse tech supervision/assist this evening, from her room to nurses station and back, tolerated well. Some abdominal discomfort with activity. Earnstine Regal, RN

## 2012-06-22 NOTE — Anesthesia Preprocedure Evaluation (Addendum)
Anesthesia Evaluation  Patient identified by MRN, date of birth, ID band Patient awake    Reviewed: Allergy & Precautions, H&P , NPO status , Patient's Chart, lab work & pertinent test results  Airway Mallampati: I TM Distance: >3 FB     Dental  (+) Teeth Intact   Pulmonary sleep apnea ,  breath sounds clear to auscultation        Cardiovascular hypertension, Rhythm:Regular Rate:Normal     Neuro/Psych PSYCHIATRIC DISORDERS Depression  Neuromuscular disease    GI/Hepatic negative GI ROS,   Endo/Other  Morbid obesity  Renal/GU      Musculoskeletal   Abdominal   Peds  Hematology   Anesthesia Other Findings   Reproductive/Obstetrics                           Anesthesia Physical Anesthesia Plan  ASA: II  Anesthesia Plan: General   Post-op Pain Management:    Induction: Intravenous, Rapid sequence and Cricoid pressure planned  Airway Management Planned: Oral ETT  Additional Equipment:   Intra-op Plan:   Post-operative Plan: Extubation in OR  Informed Consent: I have reviewed the patients History and Physical, chart, labs and discussed the procedure including the risks, benefits and alternatives for the proposed anesthesia with the patient or authorized representative who has indicated his/her understanding and acceptance.     Plan Discussed with:   Anesthesia Plan Comments:         Anesthesia Quick Evaluation

## 2012-06-22 NOTE — Anesthesia Procedure Notes (Signed)
Procedure Name: Intubation Date/Time: 06/22/2012 10:58 AM Performed by: Despina Hidden Pre-anesthesia Checklist: Emergency Drugs available, Suction available, Patient identified and Patient being monitored Patient Re-evaluated:Patient Re-evaluated prior to inductionOxygen Delivery Method: Circle system utilized Preoxygenation: Pre-oxygenation with 100% oxygen Intubation Type: IV induction, Cricoid Pressure applied and Rapid sequence Ventilation: Mask ventilation without difficulty Laryngoscope Size: Mac and 3 Grade View: Grade I Tube type: Oral Tube size: 7.0 mm Number of attempts: 1 Airway Equipment and Method: Stylet Placement Confirmation: ETT inserted through vocal cords under direct vision,  positive ETCO2 and breath sounds checked- equal and bilateral Secured at: 22 cm Tube secured with: Tape Dental Injury: Teeth and Oropharynx as per pre-operative assessment

## 2012-06-22 NOTE — Op Note (Signed)
Patient:  Julie Berry  DOB:  Jul 07, 1981  MRN:  [id number]   Preop Diagnosis:  Cholecystitis, cholelithiasis  Postop Diagnosis:  Same  Procedure:  Laparoscopic cholecystectomy  Surgeon:  Franky Macho, M.D.  Anes:  General endotracheal  Indications:  Patient is a 31 year old black female a history of cholelithiasis who presented with another episode of cholecystitis, cholelithiasis. The risks and benefits of the procedure including bleeding, infection, hepatobiliary injury, and the possibility of an open procedure were fully explained to the patient, who gave informed consent.  Procedure note:  The patient was placed in the supine position. After induction of general endotracheal anesthesia, the abdomen was prepped and draped using usual sterile technique with DuraPrep. Surgical site confirmation was performed.  A supraumbilical incision was made down to the fascia. A Veress needle was introduced into the abdominal cavity and confirmation of placement was done using the saline drop test. The abdomen was then insufflated to 16 mm mercury pressure. An 11 mm trocar was introduced into the abdominal cavity under direct visualization without difficulty. The patient was placed in reverse Trendelenburg position and additional 11 mm trocar was placed the epigastric region. 5 mm trochars were placed the right upper quadrant and right flank regions. The liver was inspected and noted within normal limits. The gallbladder was retracted in a dynamic fashion in order to expose the triangle of Calot. The cystic duct was first identified. Its juncture to the infundibulum was fully identified. The patient was noted to have hydrops of the gallbladder, thus the gallbladder was decompressed. The cystic duct was divided using the vascular Endo GIA. The cystic artery was ligated and divided using clips. The gallbladder was then freed away from the gallbladder fossa using Bovie electrocautery. The gallbladder  delivered through the epigastric trocar site using an Endo Catch bag. The gallbladder fossa was inspected no abnormal bleeding or bile leakage was noted. Surgicel is placed the gallbladder fossa. All fluid and air were then evacuated from the abdominal cavity prior to removal of the trochars.  All wounds were irrigated with normal saline. All wounds were injected with 0.5% Sensorcaine. The epigastric fascia as well as the supraumbilical fascia was reapproximated using 0 Vicryl interrupted sutures. All skin incisions were closed using staples. Betadine ointment and dressed a dressings were applied.  All tape and needle counts were correct at the end of the procedure. Patient was extubated in the operating room and went back to recovery room awake in stable condition.  Complications:  None  EBL:  Minimal  Specimen:  Gallbladder

## 2012-06-22 NOTE — Anesthesia Postprocedure Evaluation (Signed)
  Anesthesia Post-op Note  Patient: Julie Berry  Procedure(s) Performed: Procedure(s) (LRB) with comments: LAPAROSCOPIC CHOLECYSTECTOMY (N/A)  Patient Location: PACU  Anesthesia Type:General  Level of Consciousness: awake, alert , oriented and patient cooperative  Airway and Oxygen Therapy: Patient Spontanous Breathing  Post-op Pain: 2 /10, mild  Post-op Assessment: Post-op Vital signs reviewed, Patient's Cardiovascular Status Stable, Respiratory Function Stable, Patent Airway, No signs of Nausea or vomiting and Pain level controlled  Post-op Vital Signs: Reviewed and stable  Complications: No apparent anesthesia complications

## 2012-06-22 NOTE — Progress Notes (Signed)
06/22/12 1607 Patient assisted up to recliner chair this afternoon. Repositioned with pillows for comfort, states "more comfortable, feel better sitting in chair". States less back discomfort sitting up. Reviewed correct use and rationale for incentive spirometer this afternoon, pt verbalizes understanding. Reviewed use of pillow as splint for abdomen with repositioning, or any coughing/sneezing. States understands.  Nursing to monitor. Earnstine Regal, RN

## 2012-06-23 LAB — COMPREHENSIVE METABOLIC PANEL
ALT: 19 U/L (ref 0–35)
AST: 20 U/L (ref 0–37)
Albumin: 3.1 g/dL — ABNORMAL LOW (ref 3.5–5.2)
Calcium: 8.9 mg/dL (ref 8.4–10.5)
Chloride: 103 mEq/L (ref 96–112)
Creatinine, Ser: 0.96 mg/dL (ref 0.50–1.10)
Sodium: 140 mEq/L (ref 135–145)

## 2012-06-23 LAB — CBC
Hemoglobin: 12.6 g/dL (ref 12.0–15.0)
MCH: 27.7 pg (ref 26.0–34.0)
MCV: 85.1 fL (ref 78.0–100.0)
RBC: 4.55 MIL/uL (ref 3.87–5.11)

## 2012-06-23 MED ORDER — EPINEPHRINE HCL 0.1 MG/ML IJ SOLN
INTRAMUSCULAR | Status: AC
  Filled 2012-06-23: qty 10

## 2012-06-23 MED ORDER — HYDROCODONE-ACETAMINOPHEN 5-325 MG PO TABS: 1.0000 | ORAL_TABLET | ORAL | Status: DC | PRN

## 2012-06-23 NOTE — Discharge Summary (Signed)
Physician Discharge Summary  Patient ID: Julie Berry MRN: [id number] DOB/AGE: [birth year] 32 y.o.  Admit date: 06/20/2012 Discharge date: 06/23/2012  Admission Diagnoses: Cholecystitis, cholelithiasis  Discharge Diagnoses: Same Active Problems:  * No active hospital problems. *    Discharged Condition: good  Hospital Course: Patient is a 31 year old black female who presented to [facility] with a known history of cholelithiasis and worsening right upper quadrant abdominal pain. She was admitted to the hospital on 06/20/2012 for control of her pain and nausea. She subsequently underwent laparoscopic cholecystectomy on 06/22/2012. Start the procedure well. Postoperative course was unremarkable. Her diet was advanced without difficulty.  Patient is being discharged home in good improving condition. She was instructed to eat bananas as her potassium was slightly low.  Treatments: surgery: Laparoscopic cholecystectomy on 06/22/2012  Discharge Exam: Blood pressure 110/66, pulse 63, temperature 97.3 F (36.3 C), temperature source Oral, resp. rate 18, height 5\' 6"  (1.676 m), weight 124.376 kg (274 lb 3.2 oz), last menstrual period 06/07/2012, SpO2 100.00%. General appearance: alert, cooperative and no distress Resp: clear to auscultation bilaterally Cardio: regular rate and rhythm, S1, S2 normal, no murmur, click, rub or gallop GI: Soft. Incisions healing well. No rigidity noted. No distention noted.  Disposition: 01-Home or Self Care     Medication List     As of 06/23/2012  9:59 AM    TAKE these medications         HYDROcodone-acetaminophen 5-325 MG per tablet   Commonly known as: NORCO/VICODIN   Take 1-2 tablets by mouth every 4 (four) hours as needed for pain. For pain      pantoprazole 20 MG tablet   Commonly known as: PROTONIX   Take 20 mg by mouth daily as needed. For acid reflux           Follow-up Information    Follow up with Dalia Heading, MD.  Schedule an appointment as soon as possible for a visit on 06/30/2012.   Contact information:   [address] [place] [address] [phone]          Signed: Franky Macho A 06/23/2012, 9:59 AM

## 2012-06-23 NOTE — Progress Notes (Signed)
Pt provided with discharge instructions.  Pt verbalized understanding of d/c instructions.  Pt provided with prescription for pain medication.  Pt's IV removed.  Pt tolerated well.  Pt transported via w/c to main entrance by nurse.  Pt stable at time of discharge.  Pt transported by her husband home.

## 2012-06-23 NOTE — Addendum Note (Signed)
Addendum  created 06/23/12 1404 by Juliann Olesky J Kameisha Malicki, CRNA   Modules edited:Notes Section    

## 2012-06-23 NOTE — Anesthesia Postprocedure Evaluation (Signed)
  Anesthesia Post-op Note  Patient: Julie Berry  Procedure(s) Performed: Procedure(s) (LRB) with comments: LAPAROSCOPIC CHOLECYSTECTOMY (N/A)  Patient Location: room 305  Anesthesia Type:General  Level of Consciousness: awake, alert , oriented and patient cooperative  Airway and Oxygen Therapy: Patient Spontanous Breathing  Post-op Pain: 3 /10, mild  Post-op Assessment: Post-op Vital signs reviewed, Patient's Cardiovascular Status Stable, Respiratory Function Stable, Patent Airway, No signs of Nausea or vomiting and Pain level controlled  Post-op Vital Signs: Reviewed and stable  Complications: No apparent anesthesia complications

## 2012-06-24 ENCOUNTER — Encounter (HOSPITAL_COMMUNITY): Payer: Self-pay | Admitting: General Surgery

## 2012-06-25 NOTE — Progress Notes (Signed)
UR Chart Review Completed  

## 2012-10-03 ENCOUNTER — Other Ambulatory Visit: Payer: Self-pay

## 2013-06-24 ENCOUNTER — Other Ambulatory Visit: Payer: Self-pay

## 2013-08-05 ENCOUNTER — Ambulatory Visit (INDEPENDENT_AMBULATORY_CARE_PROVIDER_SITE_OTHER): Payer: BC Managed Care – PPO | Admitting: Cardiovascular Disease

## 2013-08-05 ENCOUNTER — Encounter: Payer: Self-pay | Admitting: *Deleted

## 2013-08-05 ENCOUNTER — Encounter: Payer: Self-pay | Admitting: Cardiovascular Disease

## 2013-08-05 VITALS — BP 129/71 | HR 69 | Ht 66.0 in | Wt 283.0 lb

## 2013-08-05 DIAGNOSIS — M79602 Pain in left arm: Secondary | ICD-10-CM

## 2013-08-05 DIAGNOSIS — M79609 Pain in unspecified limb: Secondary | ICD-10-CM

## 2013-08-05 DIAGNOSIS — R5381 Other malaise: Secondary | ICD-10-CM

## 2013-08-05 DIAGNOSIS — F410 Panic disorder [episodic paroxysmal anxiety] without agoraphobia: Secondary | ICD-10-CM

## 2013-08-05 DIAGNOSIS — G4730 Sleep apnea, unspecified: Secondary | ICD-10-CM

## 2013-08-05 DIAGNOSIS — I10 Essential (primary) hypertension: Secondary | ICD-10-CM

## 2013-08-05 DIAGNOSIS — R5383 Other fatigue: Secondary | ICD-10-CM

## 2013-08-05 DIAGNOSIS — R079 Chest pain, unspecified: Secondary | ICD-10-CM

## 2013-08-05 NOTE — Patient Instructions (Signed)
Your physician recommends that you schedule a follow-up appointment in: 1 months with Dr Purvis Sheffield  Your physician recommends that you return for lab work this week. TSH, Free T4  Your physician has requested that you have an echocardiogram. Echocardiography is a painless test that uses sound waves to create images of your heart. It provides your doctor with information about the size and shape of your heart and how well your heart's chambers and valves are working. This procedure takes approximately one hour. There are no restrictions for this procedure.   Your physician has requested that you have a stress echocardiogram. For further information please visit [email or web address] Please follow instruction sheet as given.  You have been referred to Dr Juanetta Gosling

## 2013-08-05 NOTE — Addendum Note (Signed)
Addended by: Thompson Grayer on: 08/05/2013 02:16 PM   Modules accepted: Orders
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
## 2013-08-06 ENCOUNTER — Telehealth: Payer: Self-pay

## 2013-08-06 NOTE — Telephone Encounter (Signed)
Results of tsa given to pt

## 2013-08-10 ENCOUNTER — Telehealth: Payer: Self-pay

## 2013-08-10 ENCOUNTER — Telehealth: Payer: Self-pay | Admitting: Cardiovascular Disease

## 2013-08-10 NOTE — Telephone Encounter (Signed)
FYI message below.

## 2013-08-10 NOTE — Telephone Encounter (Signed)
I contacted the patient to obtain correct insurance. The patient stated that they do not have active insurance coverage at this time. The patient stated that they would like to cancel and reschedule once active insurance is obtained, possibly in February, 2015. I notified [facility] to cancel patient's appt for 08/18/13. I also notified Dr. Junius Argyle nurse in [place].

## 2013-08-10 NOTE — Telephone Encounter (Signed)
Message copied by Nori Riis on Tue Aug 10, 2013  3:58 PM ------      Message from: Delphina Cahill      Created: Tue Aug 10, 2013  3:31 PM      Regarding: Zollie Pee,            This is Kim from [employer]. I'm assisting with pre-certs while Morrie Sheldon from the [employer] is out.            This patient was scheduled for an Echo at [facility] on 08/18/13 ordered by Dr. Purvis Sheffield. The patient does not have active insurance at this time and would like to cancel this appointment. She stated that she will call back to reschedule once her insurance is active again; she thinks this will be in February. I have already contacted [facility] and notified them to cancel her appointment. I wanted to give you a heads up on why we're cancelling. Thank you and feel free to let me know if you have any questions!            Colin Ina      Pre-Certification Coordinator [facility]      [phone]       ------

## 2013-08-18 ENCOUNTER — Other Ambulatory Visit (HOSPITAL_COMMUNITY): Payer: Self-pay

## 2013-08-20 ENCOUNTER — Inpatient Hospital Stay (HOSPITAL_COMMUNITY): Admission: RE | Admit: 2013-08-20 | Payer: Self-pay | Source: Ambulatory Visit

## 2013-08-24 ENCOUNTER — Emergency Department (HOSPITAL_COMMUNITY): Payer: Self-pay

## 2013-08-24 ENCOUNTER — Emergency Department (HOSPITAL_COMMUNITY)
Admission: EM | Admit: 2013-08-24 | Discharge: 2013-08-25 | Disposition: A | Discharge status: Home / Self Care | Payer: Self-pay | Attending: Emergency Medicine | Admitting: Emergency Medicine

## 2013-08-24 ENCOUNTER — Encounter (HOSPITAL_COMMUNITY): Payer: Self-pay | Admitting: Emergency Medicine

## 2013-08-24 DIAGNOSIS — Z862 Personal history of diseases of the blood and blood-forming organs and certain disorders involving the immune mechanism: Secondary | ICD-10-CM | POA: Insufficient documentation

## 2013-08-24 DIAGNOSIS — R109 Unspecified abdominal pain: Secondary | ICD-10-CM

## 2013-08-24 DIAGNOSIS — M542 Cervicalgia: Secondary | ICD-10-CM | POA: Insufficient documentation

## 2013-08-24 DIAGNOSIS — Z880 Allergy status to penicillin: Secondary | ICD-10-CM | POA: Insufficient documentation

## 2013-08-24 DIAGNOSIS — Z8742 Personal history of other diseases of the female genital tract: Secondary | ICD-10-CM | POA: Insufficient documentation

## 2013-08-24 DIAGNOSIS — I10 Essential (primary) hypertension: Secondary | ICD-10-CM | POA: Insufficient documentation

## 2013-08-24 DIAGNOSIS — Z9089 Acquired absence of other organs: Secondary | ICD-10-CM | POA: Insufficient documentation

## 2013-08-24 DIAGNOSIS — K5289 Other specified noninfective gastroenteritis and colitis: Secondary | ICD-10-CM | POA: Insufficient documentation

## 2013-08-24 DIAGNOSIS — Z8659 Personal history of other mental and behavioral disorders: Secondary | ICD-10-CM | POA: Insufficient documentation

## 2013-08-24 DIAGNOSIS — J029 Acute pharyngitis, unspecified: Secondary | ICD-10-CM | POA: Insufficient documentation

## 2013-08-24 DIAGNOSIS — K529 Noninfective gastroenteritis and colitis, unspecified: Secondary | ICD-10-CM

## 2013-08-24 DIAGNOSIS — Z91040 Latex allergy status: Secondary | ICD-10-CM | POA: Insufficient documentation

## 2013-08-24 DIAGNOSIS — Z87440 Personal history of urinary (tract) infections: Secondary | ICD-10-CM | POA: Insufficient documentation

## 2013-08-24 LAB — BASIC METABOLIC PANEL
BUN: 17 mg/dL (ref 6–23)
CALCIUM: 9.1 mg/dL (ref 8.4–10.5)
CO2: 26 meq/L (ref 19–32)
Chloride: 103 mEq/L (ref 96–112)
Creatinine, Ser: 0.85 mg/dL (ref 0.50–1.10)
GFR calc Af Amer: >90 mL/min (ref >90)
GFR, EST NON AFRICAN AMERICAN: 89 mL/min — AB (ref >90)
GLUCOSE: 129 mg/dL — AB (ref 70–99)
POTASSIUM: 3.8 meq/L (ref 3.7–5.3)
SODIUM: 142 meq/L (ref 137–147)

## 2013-08-24 LAB — URINALYSIS, ROUTINE W REFLEX MICROSCOPIC
Bilirubin Urine: NEGATIVE
GLUCOSE, UA: NEGATIVE mg/dL
Ketones, ur: NEGATIVE mg/dL
LEUKOCYTES UA: NEGATIVE
Nitrite: NEGATIVE
UROBILINOGEN UA: 0.2 mg/dL (ref 0.0–1.0)
pH: 5.5 (ref 5.0–8.0)

## 2013-08-24 LAB — CBC WITH DIFFERENTIAL/PLATELET
BASOS ABS: 0 10*3/uL (ref 0.0–0.1)
Basophils Relative: 0 % (ref 0–1)
Eosinophils Absolute: 0 10*3/uL (ref 0.0–0.7)
Eosinophils Relative: 0 % (ref 0–5)
HCT: 42.8 % (ref 36.0–46.0)
Hemoglobin: 13.9 g/dL (ref 12.0–15.0)
LYMPHS ABS: 0.5 10*3/uL — AB (ref 0.7–4.0)
LYMPHS PCT: 5 % — AB (ref 12–46)
MCH: 27.7 pg (ref 26.0–34.0)
MCHC: 32.5 g/dL (ref 30.0–36.0)
MCV: 85.3 fL (ref 78.0–100.0)
Monocytes Absolute: 0.5 10*3/uL (ref 0.1–1.0)
Monocytes Relative: 5 % (ref 3–12)
NEUTROS ABS: 9.2 10*3/uL — AB (ref 1.7–7.7)
NEUTROS PCT: 89 % — AB (ref 43–77)
PLATELETS: 294 10*3/uL (ref 150–400)
RBC: 5.02 MIL/uL (ref 3.87–5.11)
RDW: 13.8 % (ref 11.5–15.5)
WBC: 10.2 10*3/uL (ref 4.0–10.5)

## 2013-08-24 LAB — URINE MICROSCOPIC-ADD ON

## 2013-08-24 MED ORDER — SODIUM CHLORIDE 0.9 % IV SOLN
INTRAVENOUS | Status: DC
  Administered 2013-08-24: 23:00:00 via INTRAVENOUS

## 2013-08-24 MED ORDER — PROMETHAZINE HCL 25 MG/ML IJ SOLN
12.5000 mg | Freq: Once | INTRAMUSCULAR | Status: AC
  Administered 2013-08-24: 12.5 mg via INTRAVENOUS
  Filled 2013-08-24: qty 1

## 2013-08-24 MED ORDER — SODIUM CHLORIDE 0.9 % IV BOLUS (SEPSIS)
1000.0000 mL | Freq: Once | INTRAVENOUS | Status: AC
  Administered 2013-08-24: 1000 mL via INTRAVENOUS

## 2013-08-24 MED ORDER — IOHEXOL 300 MG/ML  SOLN
50.0000 mL | Freq: Once | INTRAMUSCULAR | Status: AC | PRN
  Administered 2013-08-24: 50 mL via ORAL

## 2013-08-24 MED ORDER — ONDANSETRON 4 MG PO TBDP: 4.0000 mg | ORAL_TABLET | Freq: Three times a day (TID) | ORAL | Status: DC | PRN

## 2013-08-24 MED ORDER — LOPERAMIDE HCL 2 MG PO TABS: 2.0000 mg | ORAL_TABLET | Freq: Four times a day (QID) | ORAL | Status: DC | PRN

## 2013-08-24 MED ORDER — ONDANSETRON HCL 4 MG/2ML IJ SOLN
4.0000 mg | Freq: Once | INTRAMUSCULAR | Status: AC
  Administered 2013-08-25: 4 mg via INTRAVENOUS
  Filled 2013-08-24: qty 2

## 2013-08-24 MED ORDER — ONDANSETRON HCL 4 MG/2ML IJ SOLN
4.0000 mg | Freq: Once | INTRAMUSCULAR | Status: AC
  Administered 2013-08-24: 4 mg via INTRAVENOUS
  Filled 2013-08-24: qty 2

## 2013-08-24 MED ORDER — IOHEXOL 300 MG/ML  SOLN
100.0000 mL | Freq: Once | INTRAMUSCULAR | Status: AC | PRN
  Administered 2013-08-24: 100 mL via INTRAVENOUS

## 2013-08-24 MED ORDER — PROMETHAZINE HCL 25 MG PO TABS: 25.0000 mg | ORAL_TABLET | Freq: Four times a day (QID) | ORAL | Status: DC | PRN

## 2013-08-24 NOTE — ED Notes (Signed)
Pt woke this am w/ nausea, vomiting and diarrhea. No fever. +ab. Cramping. Denies any urinary s/s, denies any vaginal discharge.

## 2013-08-24 NOTE — ED Provider Notes (Addendum)
CSN: [id number]     Arrival date & time 08/24/13  1340 History  This chart was scribed for Julie Kung, MD by Donato Schultz, ED Scribe. This patient was seen in room [facility] and the patient's care was started at 10:00 PM.     Chief Complaint  Patient presents with  . Abdominal Pain    Patient is a 33 y.o. female presenting with abdominal pain. The history is provided by the patient. No language interpreter was used.  Abdominal Pain Pain location:  Epigastric Pain quality: cramping   Pain severity:  Moderate Duration:  12 hours Timing:  Intermittent Chronicity:  New Associated symptoms: cough (dyr), diarrhea, nausea, sore throat and vomiting   Associated symptoms: no chest pain, no chills, no dysuria, no fever and no shortness of breath   Cough:    Cough characteristics:  Non-productive and dry   Chronicity:  New Diarrhea:    Quality:  Watery   Duration:  12 hours   Timing:  Constant Sore throat:    Severity:  Moderate   Duration:  2 days   Timing:  Constant Vomiting:    Number of occurrences:  1   Duration:  12 hours  HPI Comments: Julie Berry is a 33 y.o. female who presents to the Emergency Department complaining of a constant sore throat that started two days ago.  The patient states that she took Robatussin yesterday at 6 PM and this morning for her sore throat.  She states that since then she has experienced loose stools  and emesis.  She states that the emesis started at 10 AM this morning and she only had one episode.  She is unsure as to what time her diarrhea started but states that is also occurred this morning. She states that she had a cholecystectomy in November of 2014.  She denies any sick contacts.    The patient's PCP is Dr. Gerarda Fraction at [place].  She states that she is going to see Dr. Luan Pulling on the 16th of January to possible start using a CPAP.    Past Medical History  Diagnosis Date  . UTI (urinary tract infection)   . HTN (hypertension)   .  Lump or mass in breast   . Leukocytosis   . Hyperglycemia   . Malaise and fatigue   . Morbid obesity   . Sleep apnea   . Depressed   . Low back pain   . Sciatica   . Gallstones    Past Surgical History  Procedure Laterality Date  . Cholecystectomy  06/22/2012    Procedure: LAPAROSCOPIC CHOLECYSTECTOMY;  Surgeon: Jamesetta So, MD;  Location: [facility];  Service: General;  Laterality: N/A;   Family History  Problem Relation Age of Onset  . Diabetes Mother   . Rheumatic fever Mother 83  . Migraines Father 43  . Hypertension Father   . Autism Brother    History  Substance Use Topics  . Smoking status: Never Smoker   . Smokeless tobacco: Not on file  . Alcohol Use: No   OB History   Grav Para Term Preterm Abortions TAB SAB Ect Mult Living                 Review of Systems  Constitutional: Negative for fever and chills.  HENT: Positive for sore throat. Negative for rhinorrhea.   Eyes: Negative for visual disturbance.  Respiratory: Positive for cough (dyr). Negative for shortness of breath.   Cardiovascular: Negative for chest  pain and leg swelling.  Gastrointestinal: Positive for nausea, vomiting, abdominal pain and diarrhea.  Genitourinary: Negative for dysuria.  Musculoskeletal: Positive for neck pain. Negative for myalgias.  Skin: Negative for rash.  Neurological: Positive for headaches.  Hematological: Does not bruise/bleed easily.  Psychiatric/Behavioral: Negative for confusion.  All other systems reviewed and are negative.    Allergies  Amoxicillin; Penicillins; Dilaudid; and Latex  Home Medications   Current Outpatient Rx  Name  Route  Sig  Dispense  Refill  . Dextromethorphan-Guaifenesin (ROBITUSSIN DM) 10-100 MG/5ML liquid   Oral   Take 10 mLs by mouth daily as needed (for cold and symptoms).         . naproxen sodium (ALEVE) 220 MG tablet   Oral   Take 220 mg by mouth daily as needed (for menstrual cramping).         . pantoprazole (PROTONIX)  20 MG tablet   Oral   Take 20 mg by mouth daily as needed. For acid reflux         . loperamide (IMODIUM A-D) 2 MG tablet   Oral   Take 1 tablet (2 mg total) by mouth 4 (four) times daily as needed for diarrhea or loose stools.   30 tablet   0   . ondansetron (ZOFRAN ODT) 4 MG disintegrating tablet   Oral   Take 1 tablet (4 mg total) by mouth every 8 (eight) hours as needed.   10 tablet   0   . EXPIRED: promethazine (PHENERGAN) 25 MG tablet   Oral   Take 1 tablet (25 mg total) by mouth every 6 (six) hours as needed for nausea.   15 tablet   0   . promethazine (PHENERGAN) 25 MG tablet   Oral   Take 1 tablet (25 mg total) by mouth every 6 (six) hours as needed for nausea or vomiting.   12 tablet   0    Triage Vitals: BP 141/64  Pulse 94  Temp(Src) 100.4 F (38 C) (Oral)  Resp 20  Ht 5\' 6"  (1.676 m)  Wt 283 lb (128.368 kg)  BMI 45.70 kg/m2  SpO2 100%  LMP 08/22/2013  Physical Exam  Nursing note and vitals reviewed. Constitutional: She is oriented to person, place, and time. She appears well-developed and well-nourished.  HENT:  Head: Normocephalic and atraumatic.  Mouth/Throat: Mucous membranes are dry.  Eyes: Conjunctivae and EOM are normal. Pupils are equal, round, and reactive to light.  Neck: Normal range of motion and phonation normal. Neck supple.  Cardiovascular: Normal rate, regular rhythm, normal heart sounds and intact distal pulses.   No murmur heard. Pulmonary/Chest: Effort normal and breath sounds normal. She exhibits no tenderness.  Abdominal: Soft. Bowel sounds are normal. There is tenderness (mild and diffuse). There is no guarding.  Musculoskeletal: Normal range of motion. She exhibits no edema.  Neurological: She is alert and oriented to person, place, and time. No cranial nerve deficit. She exhibits normal muscle tone.  Skin: Skin is warm and dry.  Psychiatric: She has a normal mood and affect. Her behavior is normal. Judgment and thought  content normal.    ED Course  Procedures (including critical care time)  DIAGNOSTIC STUDIES: Oxygen Saturation is 100% on room air, normal by my interpretation.    COORDINATION OF CARE: 10:03 PM- Discussed obtaining a CAT scan of the patient's abdomen and a clinical suspicion of a viral infection.  Discussed administering medication for pain and nausea in the ED.  The patient  agreed to the treatment plan.    Labs Review Labs Reviewed  CBC WITH DIFFERENTIAL - Abnormal; Notable for the following:    Neutrophils Relative % 89 (*)    Neutro Abs 9.2 (*)    Lymphocytes Relative 5 (*)    Lymphs Abs 0.5 (*)    All other components within normal limits  BASIC METABOLIC PANEL - Abnormal; Notable for the following:    Glucose, Bld 129 (*)    GFR calc non Af Amer 89 (*)    All other components within normal limits  URINALYSIS, ROUTINE W REFLEX MICROSCOPIC - Abnormal; Notable for the following:    Specific Gravity, Urine >1.030 (*)    Hgb urine dipstick LARGE (*)    Protein, ur TRACE (*)    All other components within normal limits  URINE MICROSCOPIC-ADD ON - Abnormal; Notable for the following:    Bacteria, UA FEW (*)    All other components within normal limits   Results for orders placed during the hospital encounter of 08/24/13  CBC WITH DIFFERENTIAL      Result Value Range   WBC 10.2  4.0 - 10.5 K/uL   RBC 5.02  3.87 - 5.11 MIL/uL   Hemoglobin 13.9  12.0 - 15.0 g/dL   HCT 42.8  36.0 - 46.0 %   MCV 85.3  78.0 - 100.0 fL   MCH 27.7  26.0 - 34.0 pg   MCHC 32.5  30.0 - 36.0 g/dL   RDW 13.8  11.5 - 15.5 %   Platelets 294  150 - 400 K/uL   Neutrophils Relative % 89 (*) 43 - 77 %   Neutro Abs 9.2 (*) 1.7 - 7.7 K/uL   Lymphocytes Relative 5 (*) 12 - 46 %   Lymphs Abs 0.5 (*) 0.7 - 4.0 K/uL   Monocytes Relative 5  3 - 12 %   Monocytes Absolute 0.5  0.1 - 1.0 K/uL   Eosinophils Relative 0  0 - 5 %   Eosinophils Absolute 0.0  0.0 - 0.7 K/uL   Basophils Relative 0  0 - 1 %    Basophils Absolute 0.0  0.0 - 0.1 K/uL  BASIC METABOLIC PANEL      Result Value Range   Sodium 142  137 - 147 mEq/L   Potassium 3.8  3.7 - 5.3 mEq/L   Chloride 103  96 - 112 mEq/L   CO2 26  19 - 32 mEq/L   Glucose, Bld 129 (*) 70 - 99 mg/dL   BUN 17  6 - 23 mg/dL   Creatinine, Ser 0.85  0.50 - 1.10 mg/dL   Calcium 9.1  8.4 - 10.5 mg/dL   GFR calc non Af Amer 89 (*) >90 mL/min   GFR calc Af Amer >90  >90 mL/min  URINALYSIS, ROUTINE W REFLEX MICROSCOPIC      Result Value Range   Color, Urine YELLOW  YELLOW   APPearance CLEAR  CLEAR   Specific Gravity, Urine >1.030 (*) 1.005 - 1.030   pH 5.5  5.0 - 8.0   Glucose, UA NEGATIVE  NEGATIVE mg/dL   Hgb urine dipstick LARGE (*) NEGATIVE   Bilirubin Urine NEGATIVE  NEGATIVE   Ketones, ur NEGATIVE  NEGATIVE mg/dL   Protein, ur TRACE (*) NEGATIVE mg/dL   Urobilinogen, UA 0.2  0.0 - 1.0 mg/dL   Nitrite NEGATIVE  NEGATIVE   Leukocytes, UA NEGATIVE  NEGATIVE  URINE MICROSCOPIC-ADD ON      Result Value Range  Squamous Epithelial / LPF RARE  RARE   WBC, UA 0-2  <3 WBC/hpf   RBC / HPF TOO NUMEROUS TO COUNT  <3 RBC/hpf   Bacteria, UA FEW (*) RARE    Imaging Review Ct Abdomen Pelvis W Contrast  08/24/2013   CLINICAL DATA:  Pain with nausea and vomiting  EXAM: CT ABDOMEN AND PELVIS WITH CONTRAST  TECHNIQUE: Multidetector CT imaging of the abdomen and pelvis was performed using the standard protocol following bolus administration of intravenous contrast. Oral contrast was also administered.  CONTRAST:  165mL OMNIPAQUE IOHEXOL 300 MG/ML  SOLN  COMPARISON:  June 20, 2012  FINDINGS: Lung bases are clear.  No focal liver lesions are identified. Gallbladder is absent. There is no biliary duct dilatation. Gallbladder is absent.  Spleen, pancreas, and adrenals appear normal. Kidneys bilaterally show no mass or hydronephrosis on either side. There is no renal or ureteral calculus on either side.  In the pelvis, there is no mass or fluid collection. Appendix  appears normal.  There is no bowel obstruction. There is no free air or portal venous air.  There is no ascites, adenopathy, or abscess in the abdomen or pelvis. Aorta is nonaneurysmal. There are no blastic or lytic bone lesions.  IMPRESSION: Gallbladder absent.  No bowel obstruction. No abscess. Appendix appears normal. No hydronephrosis.   Electronically Signed   By: Lowella Grip M.D.   On: 08/24/2013 23:47    EKG Interpretation   None       MDM   1. Gastroenteritis   2. Abdominal pain     Labs without significant abnormalities other than urinalysis large amount of blood the patient is currently having vaginal bleeding for her regular menstrual cycle. Not likely representing a urinary tract infection. CT scan is pending to rule out any intra-abdominal process since the vomiting has been minimal otherwise this is probably a gastroenteritis. If so patient will be discharged home with antinausea medicine and Imodium right ear.  I personally performed the services described in this documentation, which was scribed in my presence. The recorded information has been reviewed and is accurate.     Julie Kung, MD 08/24/13 2257   CT scan of the abdomen without any acute intra-abdominal findings. Symptoms most likely consistent with a viral gastroenteritis. Will discharge home.    Julie Kung, MD 08/24/13 [phone]

## 2013-08-24 NOTE — Discharge Instructions (Signed)
Symptoms most likely related to a viral illness. Take the Imodium as needed for the diarrhea. Take the Phenergan Zofran as needed for the nausea and vomiting. Followup with your doctor in the next few days if not improved. Return here for any newer worse symptoms.

## 2013-09-02 ENCOUNTER — Ambulatory Visit (HOSPITAL_COMMUNITY)
Admission: RE | Admit: 2013-09-02 | Discharge: 2013-09-02 | Disposition: A | Discharge status: Home / Self Care | Payer: Self-pay | Source: Ambulatory Visit | Attending: Cardiovascular Disease | Admitting: Cardiovascular Disease

## 2013-09-02 DIAGNOSIS — I517 Cardiomegaly: Secondary | ICD-10-CM

## 2013-09-02 DIAGNOSIS — R079 Chest pain, unspecified: Secondary | ICD-10-CM | POA: Insufficient documentation

## 2013-09-02 NOTE — Progress Notes (Signed)
*  PRELIMINARY RESULTS* Echocardiogram 2D Echocardiogram has been performed.  Tera Partridge 09/02/2013, 1:27 PM

## 2013-09-03 ENCOUNTER — Encounter (HOSPITAL_COMMUNITY): Payer: Self-pay

## 2013-09-03 ENCOUNTER — Ambulatory Visit (HOSPITAL_COMMUNITY)
Admission: RE | Admit: 2013-09-03 | Discharge: 2013-09-03 | Disposition: A | Discharge status: Home / Self Care | Payer: No Typology Code available for payment source | Source: Ambulatory Visit | Attending: Cardiovascular Disease | Admitting: Cardiovascular Disease

## 2013-09-03 DIAGNOSIS — E785 Hyperlipidemia, unspecified: Secondary | ICD-10-CM | POA: Insufficient documentation

## 2013-09-03 DIAGNOSIS — Z6841 Body Mass Index (BMI) 40.0 and over, adult: Secondary | ICD-10-CM | POA: Insufficient documentation

## 2013-09-03 DIAGNOSIS — R079 Chest pain, unspecified: Secondary | ICD-10-CM

## 2013-09-03 DIAGNOSIS — R072 Precordial pain: Secondary | ICD-10-CM

## 2013-09-03 DIAGNOSIS — I10 Essential (primary) hypertension: Secondary | ICD-10-CM | POA: Insufficient documentation

## 2013-09-03 NOTE — Progress Notes (Signed)
Stress Lab Nurses Notes - Julie Berry  Julie Berry 09/03/2013 Reason for doing test: Chest Pain and Fatigue Type of test: Stress Echo Nurse performing test: Julie Halls, RN Nuclear Medicine Tech: Not Applicable Echo Tech: Julie Neighbor MD performing test: Julie Nails NP Family MD: Julie Berry Test explained and consent signed: yes IV started: No IV started Symptoms: Fatigue Treatment/Intervention: None Reason test stopped: fatigue and reached target HR After recovery IV was: Berry Patient to return to [place]. Med at :Berry Patient discharged: Home Patient's Condition upon discharge was: stable Comments: During test peak BP 228/108 & HR 169 .  Recovery BP 103/57 & HR 101.  Symptoms resolved in recovery. Julie Berry

## 2013-09-03 NOTE — Progress Notes (Signed)
*  PRELIMINARY RESULTS* Echocardiogram Echocardiogram Stress Test has been performed.  [place], [place] 09/03/2013, 12:26 PM

## 2013-09-17 ENCOUNTER — Ambulatory Visit (INDEPENDENT_AMBULATORY_CARE_PROVIDER_SITE_OTHER): Payer: Self-pay | Admitting: Cardiovascular Disease

## 2013-09-17 ENCOUNTER — Encounter: Payer: Self-pay | Admitting: Cardiovascular Disease

## 2013-09-17 VITALS — BP 137/62 | HR 80 | Ht 66.0 in | Wt 281.6 lb

## 2013-09-17 DIAGNOSIS — Z136 Encounter for screening for cardiovascular disorders: Secondary | ICD-10-CM

## 2013-09-17 DIAGNOSIS — G4730 Sleep apnea, unspecified: Secondary | ICD-10-CM

## 2013-09-17 DIAGNOSIS — IMO0001 Reserved for inherently not codable concepts without codable children: Secondary | ICD-10-CM

## 2013-09-17 DIAGNOSIS — R079 Chest pain, unspecified: Secondary | ICD-10-CM

## 2013-09-17 DIAGNOSIS — K219 Gastro-esophageal reflux disease without esophagitis: Secondary | ICD-10-CM

## 2013-09-17 DIAGNOSIS — R5381 Other malaise: Secondary | ICD-10-CM

## 2013-09-17 DIAGNOSIS — R5383 Other fatigue: Secondary | ICD-10-CM

## 2013-09-17 DIAGNOSIS — F410 Panic disorder [episodic paroxysmal anxiety] without agoraphobia: Secondary | ICD-10-CM

## 2013-09-17 DIAGNOSIS — Z7182 Exercise counseling: Secondary | ICD-10-CM

## 2013-09-17 NOTE — Patient Instructions (Signed)
Your physician recommends that you schedule a follow-up appointment only if needed   Your physician recommends that you continue on your current medications as directed. Please refer to the Current Medication list given to you today.   Thanks for choosing [facility] !
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
## 2013-11-21 ENCOUNTER — Emergency Department (HOSPITAL_COMMUNITY): Payer: 59

## 2013-11-21 ENCOUNTER — Encounter (HOSPITAL_COMMUNITY): Payer: Self-pay | Admitting: Emergency Medicine

## 2013-11-21 ENCOUNTER — Emergency Department (HOSPITAL_COMMUNITY)
Admission: EM | Admit: 2013-11-21 | Discharge: 2013-11-21 | Disposition: A | Discharge status: Home / Self Care | Payer: 59 | Attending: Emergency Medicine | Admitting: Emergency Medicine

## 2013-11-21 DIAGNOSIS — Z880 Allergy status to penicillin: Secondary | ICD-10-CM | POA: Insufficient documentation

## 2013-11-21 DIAGNOSIS — Z862 Personal history of diseases of the blood and blood-forming organs and certain disorders involving the immune mechanism: Secondary | ICD-10-CM | POA: Insufficient documentation

## 2013-11-21 DIAGNOSIS — Z91040 Latex allergy status: Secondary | ICD-10-CM | POA: Insufficient documentation

## 2013-11-21 DIAGNOSIS — Z8719 Personal history of other diseases of the digestive system: Secondary | ICD-10-CM | POA: Insufficient documentation

## 2013-11-21 DIAGNOSIS — Z8659 Personal history of other mental and behavioral disorders: Secondary | ICD-10-CM | POA: Insufficient documentation

## 2013-11-21 DIAGNOSIS — J40 Bronchitis, not specified as acute or chronic: Secondary | ICD-10-CM | POA: Insufficient documentation

## 2013-11-21 DIAGNOSIS — Z8742 Personal history of other diseases of the female genital tract: Secondary | ICD-10-CM | POA: Insufficient documentation

## 2013-11-21 DIAGNOSIS — I10 Essential (primary) hypertension: Secondary | ICD-10-CM | POA: Insufficient documentation

## 2013-11-21 DIAGNOSIS — Z87440 Personal history of urinary (tract) infections: Secondary | ICD-10-CM | POA: Insufficient documentation

## 2013-11-21 DIAGNOSIS — Z8739 Personal history of other diseases of the musculoskeletal system and connective tissue: Secondary | ICD-10-CM | POA: Insufficient documentation

## 2013-11-21 MED ORDER — BENZONATATE 200 MG PO CAPS: 200.0000 mg | ORAL_CAPSULE | Freq: Three times a day (TID) | ORAL | Status: DC | PRN

## 2013-11-21 MED ORDER — HYDROCOD POLST-CHLORPHEN POLST 10-8 MG/5ML PO LQCR: 5.0000 mL | Freq: Two times a day (BID) | ORAL | Status: DC | PRN

## 2013-11-21 MED ORDER — BENZONATATE 100 MG PO CAPS
200.0000 mg | ORAL_CAPSULE | Freq: Once | ORAL | Status: AC
  Administered 2013-11-21: 200 mg via ORAL
  Filled 2013-11-21: qty 2

## 2013-11-21 NOTE — ED Notes (Signed)
Pt c/o SOB, intermittent dry cough, nasal congestion, sore throat since Wednesday. Pt states she has taken Claritin but denies relief.

## 2013-11-21 NOTE — ED Provider Notes (Signed)
CSN: [id number]     Arrival date & time 11/21/13  1040 History  This chart was scribed for non-physician practitioner, Evalee Jefferson, PA-C,working with Jasper Riling. Alvino Chapel, MD, by Marlowe Kays, ED Scribe.  This patient was seen in room APA11/APA11 and the patient's care was started at 10:52 AM.  Chief Complaint  Patient presents with  . Shortness of Breath  . Nasal Congestion   The history is provided by the patient. No language interpreter was used.   HPI Comments: Julie Berry is a 33 y.o. obese female, with a history of HTN and sleep apnea who presents to the Emergency Department complaining of an irritated and itchy mild sore throat and hoarseness that started approximately four days ago. She reports associated intermittent dry cough, and nasal congestion and rhinorrhea. She reports some intermittent blood-streaked, clear mucus upon clearing her throat. She reports taking Claritin with the last dose approximately 1.5 hours ago with no relief of her symptoms. Pt reports gargling with Listerine for her throat symptoms with relief. She reports sick contacts at her job. She denies fever, CP and wheezing. She states her PCP is Dr. Gerarda Fraction. She reports her LMP was 10/24/13. She reports Dr. Luan Pulling will do a sleep study for re-evaluation of her sleep apnea, and is awaiting this test.   Past Medical History  Diagnosis Date  . UTI (urinary tract infection)   . HTN (hypertension)   . Lump or mass in breast   . Leukocytosis   . Hyperglycemia   . Malaise and fatigue   . Morbid obesity   . Sleep apnea   . Depressed   . Low back pain   . Sciatica   . Gallstones    Past Surgical History  Procedure Laterality Date  . Cholecystectomy  06/22/2012    Procedure: LAPAROSCOPIC CHOLECYSTECTOMY;  Surgeon: Jamesetta So, MD;  Location: [facility];  Service: General;  Laterality: N/A;   Family History  Problem Relation Age of Onset  . Diabetes Mother   . Rheumatic fever Mother 110  . Migraines Father 30   . Hypertension Father   . Autism Brother    History  Substance Use Topics  . Smoking status: Never Smoker   . Smokeless tobacco: Not on file  . Alcohol Use: No   OB History   Grav Para Term Preterm Abortions TAB SAB Ect Mult Living                 Review of Systems  Constitutional: Negative for fever.  HENT: Positive for congestion, sore throat and voice change.   Respiratory: Positive for cough. Negative for wheezing.   Cardiovascular: Negative for chest pain.    Allergies  Amoxicillin; Penicillins; Dilaudid; and Latex  Home Medications   Current Outpatient Rx  Name  Route  Sig  Dispense  Refill  . naproxen sodium (ANAPROX) 220 MG tablet   Oral   Take 220-440 mg by mouth daily as needed (cramps).         . benzonatate (TESSALON) 200 MG capsule   Oral   Take 1 capsule (200 mg total) by mouth 3 (three) times daily as needed for cough.   21 capsule   0   . chlorpheniramine-HYDROcodone (TUSSIONEX PENNKINETIC ER) 10-8 MG/5ML LQCR   Oral   Take 5 mLs by mouth every 12 (twelve) hours as needed for cough.   50 mL   0    Triage Vitals: BP 127/87  Pulse 92  Temp(Src) 98 F (36.7  C) (Oral)  Resp 22  SpO2 95%  LMP 10/24/2013 Physical Exam  Nursing note and vitals reviewed. Constitutional: She is oriented to person, place, and time. She appears well-developed and well-nourished.  HENT:  Head: Normocephalic and atraumatic.  Right Ear: Tympanic membrane and ear canal normal.  Left Ear: Tympanic membrane and ear canal normal.  Nose: Mucosal edema present. No rhinorrhea. No epistaxis.  Mouth/Throat: Uvula is midline and mucous membranes are normal. No oropharyngeal exudate, posterior oropharyngeal edema, posterior oropharyngeal erythema or tonsillar abscesses.  Bilateral hypertrophied tonsils. No evidence of epistaxis at this time.   Eyes: Conjunctivae are normal.  Cardiovascular: Normal rate, regular rhythm and normal heart sounds.   Pulmonary/Chest: Effort normal  and breath sounds normal. No respiratory distress. She has no wheezes. She has no rales.  Abdominal: Soft. There is no tenderness.  Musculoskeletal: Normal range of motion.  Lymphadenopathy:    She has no cervical adenopathy.  Neurological: She is alert and oriented to person, place, and time.  Skin: Skin is warm and dry. No rash noted.  Psychiatric: She has a normal mood and affect.    ED Course  Procedures (including critical care time) DIAGNOSTIC STUDIES: Oxygen Saturation is 95% on RA, adequate by my interpretation.    COORDINATION OF CARE: 11:02 AM- Will order CXR. Pt verbalizes understanding and agrees to plan.  Medications  benzonatate (TESSALON) capsule 200 mg (200 mg Oral Given 11/21/13 1124)    Labs Review Labs Reviewed - No data to display Imaging Review Dg Chest 2 View  11/21/2013   CLINICAL DATA:  SHORTNESS OF BREATH NASAL CONGESTION  EXAM: CHEST  2 VIEW  COMPARISON:  None.  FINDINGS: Low lung volumes. The heart size and mediastinal contours are within normal limits. Both lungs are clear. The visualized skeletal structures are unremarkable.  IMPRESSION: No active cardiopulmonary disease.   Electronically Signed   By: Margaree Mackintosh M.D.   On: 11/21/2013 12:30     EKG Interpretation None      MDM   Final diagnoses:  Bronchitis    Patients labs and/or radiological studies were viewed and considered during the medical decision making and disposition process.  Pt with uri/bronchitis type sx.  Her vital signs are stable today, no hypoxia, no tachypnea or tachycardia.  Doubt PE, no pneumonia on exam.  She was prescrbed tessalon peres, tussionex for qhs use.  Encouraged rest, increased fluids, cough lozenges, recheck by pcp if not improving over the next week or for any worsened sx.  I personally performed the services described in this documentation, which was scribed in my presence. The recorded information has been reviewed and is accurate.     Evalee Jefferson,  PA-C 11/22/13 [phone]

## 2013-11-21 NOTE — Discharge Instructions (Signed)
Bronchitis Bronchitis is swelling (inflammation) of the air tubes leading to your lungs (bronchi). This causes mucus and a cough. If the swelling gets bad, you may have trouble breathing. HOME CARE   Rest.  Drink enough fluids to keep your pee (urine) clear or pale yellow (unless you have a condition where you have to watch how much you drink).  Only take medicine as told by your doctor. You do not need antibiotics today.  Avoid smoke, irritating chemicals, and strong smells. These make the problem worse. Quit smoking if you smoke. This helps your lungs heal faster.  Use a cool mist humidifier. Change the water in the humidifier every day. You can also sit in the bathroom with hot shower running for 5 10 minutes. Keep the door closed.  See your health care provider as told.  Wash your hands often. GET HELP IF: Your problems do not get better after 1 week. GET HELP RIGHT AWAY IF:   Your fever gets worse.  You have chills.  Your chest hurts.  Your problems breathing get worse.  You have blood in your mucus.  You pass out (faint).  You feel lightheaded.  You have a bad headache.  You throw up (vomit) again and again. MAKE SURE YOU:  Understand these instructions.  Will watch your condition.  Will get help right away if you are not doing well or get worse. Document Released: 01/22/2008 Document Revised: 05/26/2013 Document Reviewed: 03/30/2013 [facility] Patient Information 2014 [place], [place].   Use the tessalon perles for daytime use for cough suppression,  You may use the tussionex while at home,  But this will make you drowsy, do not drive within 4 hours of taking this medicine as discussed. Laryngitis At the top of your windpipe is your voice box. It is the source of your voice. Inside your voice box are 2 bands of muscles called vocal cords. When you breathe, your vocal cords are relaxed and open so that air can get into the lungs. When you decide to say something,  these cords come together and vibrate. The sound from these vibrations goes into your throat and comes out through your mouth as sound. Laryngitis is an inflammation of the vocal cords that causes hoarseness, cough, loss of voice, sore throat, and dry throat. Laryngitis can be temporary (acute) or long-term (chronic). Most cases of acute laryngitis improve with time.Chronic laryngitis lasts for more than 3 weeks. CAUSES Laryngitis can often be related to excessive smoking, talking, or yelling, as well as inhalation of toxic fumes and allergies. Acute laryngitis is usually caused by a viral infection, vocal strain, measles or mumps, or bacterial infections. Chronic laryngitis is usually caused by vocal cord strain, vocal cord injury, postnasal drip, growths on the vocal cords, or acid reflux. SYMPTOMS   Cough.  Sore throat.  Dry throat. RISK FACTORS  Respiratory infections.  Exposure to irritating substances, such as cigarette smoke, excessive amounts of alcohol, stomach acids, and workplace chemicals.  Voice trauma, such as vocal cord injury from shouting or speaking too loud. DIAGNOSIS  Your cargiver will perform a physical exam. During the physical exam, your caregiver will examine your throat. The most common sign of laryngitis is hoarseness. Laryngoscopy may be necessary to confirm the diagnosis of this condition. This procedure allows your caregiver to look into the larynx. HOME CARE INSTRUCTIONS  Drink enough fluids to keep your urine clear or pale yellow.  Rest until you no longer have symptoms or as directed by your  caregiver.  Breathe in moist air.  Take all medicine as directed by your caregiver.  Do not smoke.  Talk as little as possible (this includes whispering).  Write on paper instead of talking until your voice is back to normal.  Follow up with your caregiver if your condition has not improved after 10 days. SEEK MEDICAL CARE IF:   You have trouble  breathing.  You cough up blood.  You have persistent fever.  You have increasing pain.  You have difficulty swallowing. MAKE SURE YOU:  Understand these instructions.  Will watch your condition.  Will get help right away if you are not doing well or get worse. Document Released: 08/05/2005 Document Revised: 10/28/2011 Document Reviewed: 10/11/2010 [facility] Patient Information 2014 [place], [place].

## 2013-11-23 NOTE — ED Provider Notes (Signed)
Medical screening examination/treatment/procedure(s) were performed by non-physician practitioner and as supervising physician I was immediately available for consultation/collaboration.   EKG Interpretation None       Trude Cansler R. Sheniqua Carolan, MD 11/23/13 1509 

## 2014-01-09 ENCOUNTER — Encounter (HOSPITAL_COMMUNITY): Payer: Self-pay | Admitting: Emergency Medicine

## 2014-01-09 ENCOUNTER — Emergency Department (HOSPITAL_COMMUNITY)
Admission: EM | Admit: 2014-01-09 | Discharge: 2014-01-09 | Disposition: A | Discharge status: Home / Self Care | Payer: 59 | Attending: Emergency Medicine | Admitting: Emergency Medicine

## 2014-01-09 DIAGNOSIS — Z87440 Personal history of urinary (tract) infections: Secondary | ICD-10-CM | POA: Insufficient documentation

## 2014-01-09 DIAGNOSIS — Z91040 Latex allergy status: Secondary | ICD-10-CM | POA: Insufficient documentation

## 2014-01-09 DIAGNOSIS — Z79899 Other long term (current) drug therapy: Secondary | ICD-10-CM | POA: Insufficient documentation

## 2014-01-09 DIAGNOSIS — Z8659 Personal history of other mental and behavioral disorders: Secondary | ICD-10-CM | POA: Insufficient documentation

## 2014-01-09 DIAGNOSIS — J3489 Other specified disorders of nose and nasal sinuses: Secondary | ICD-10-CM | POA: Insufficient documentation

## 2014-01-09 DIAGNOSIS — I10 Essential (primary) hypertension: Secondary | ICD-10-CM | POA: Insufficient documentation

## 2014-01-09 DIAGNOSIS — Z880 Allergy status to penicillin: Secondary | ICD-10-CM | POA: Insufficient documentation

## 2014-01-09 DIAGNOSIS — Z862 Personal history of diseases of the blood and blood-forming organs and certain disorders involving the immune mechanism: Secondary | ICD-10-CM | POA: Insufficient documentation

## 2014-01-09 DIAGNOSIS — Z8742 Personal history of other diseases of the female genital tract: Secondary | ICD-10-CM | POA: Insufficient documentation

## 2014-01-09 DIAGNOSIS — Z8719 Personal history of other diseases of the digestive system: Secondary | ICD-10-CM | POA: Insufficient documentation

## 2014-01-09 DIAGNOSIS — H00019 Hordeolum externum unspecified eye, unspecified eyelid: Secondary | ICD-10-CM | POA: Insufficient documentation

## 2014-01-09 DIAGNOSIS — Z791 Long term (current) use of non-steroidal anti-inflammatories (NSAID): Secondary | ICD-10-CM | POA: Insufficient documentation

## 2014-01-09 MED ORDER — ERYTHROMYCIN 5 MG/GM OP OINT
TOPICAL_OINTMENT | Freq: Once | OPHTHALMIC | Status: AC
  Administered 2014-01-09: 12:00:00 via OPHTHALMIC
  Filled 2014-01-09: qty 3.5

## 2014-01-09 NOTE — Discharge Instructions (Signed)
Sty A sty (hordeolum) is an infection of a gland in the eyelid located at the base of the eyelash. A sty may develop a white or yellow head of pus. It can be puffy (swollen). Usually, the sty will burst and pus will come out on its own. They do not leave lumps in the eyelid once they drain. A sty is often confused with another form of cyst of the eyelid called a chalazion. Chalazions occur within the eyelid and not on the edge where the bases of the eyelashes are. They often are red, sore and then form firm lumps in the eyelid. CAUSES   Germs (bacteria).  Lasting (chronic) eyelid inflammation. SYMPTOMS   Tenderness, redness and swelling along the edge of the eyelid at the base of the eyelashes.  Sometimes, there is a white or yellow head of pus. It may or may not drain. DIAGNOSIS  An ophthalmologist will be able to distinguish between a sty and a chalazion and treat the condition appropriately.  TREATMENT   Styes are typically treated with warm packs (compresses) until drainage occurs.  In rare cases, medicines that kill germs (antibiotics) may be prescribed. These antibiotics may be in the form of drops, cream or pills.  If a hard lump has formed, it is generally necessary to do a small incision and remove the hardened contents of the cyst in a minor surgical procedure done in the office.  In suspicious cases, your caregiver may send the contents of the cyst to the lab to be certain that it is not a rare, but dangerous form of cancer of the glands of the eyelid. HOME CARE INSTRUCTIONS   Wash your hands often and dry them with a clean towel. Avoid touching your eyelid. This may spread the infection to other parts of the eye.  Apply heat to your eyelid for 10 to 20 minutes, several times a day, to ease pain and help to heal it faster.  Do not squeeze the sty. Allow it to drain on its own. Wash your eyelid carefully 3 to 4 times per day to remove any pus. SEEK IMMEDIATE MEDICAL CARE IF:     Your eye becomes painful or puffy (swollen).  Your vision changes.  Your sty does not drain by itself within 3 days.  Your sty comes back within a short period of time, even with treatment.  You have redness (inflammation) around the eye.  You have a fever. Document Released: 05/15/2005 Document Revised: 10/28/2011 Document Reviewed: 01/17/2009 [facility] Patient Information 2014 [place].   Continue using warm compresses as you are currently doing.  Avoid excessive rubbing or squeezing the area which can worsen swelling.  Apply the antibiotic ointment with a Q-tip at the site and along your lower eyelid.  You may apply this 3 times daily.

## 2014-01-09 NOTE — ED Notes (Signed)
Pt states she had pain under left eye beginning yesterday, woke up this morning and noticed slight swelling.

## 2014-01-09 NOTE — ED Provider Notes (Signed)
CSN: [id number]     Arrival date & time 01/09/14  1010 History   First MD Initiated Contact with Patient 01/09/14 1047     Chief Complaint  Patient presents with  . Eye Problem    HPI This chart was scribed for non-physician practitioner, Evalee Jefferson, PA-C working with Wynetta Fines, MD, by Thea Alken, ED Scribe. This patient was seen in room APA19/APA19 and the patient's care was started at 11:13 AM.  Julie Berry is a 33 y.o. female who presents to the Emergency Department complaining of worsening left eye pain onset 1 day. Pt states she woke up this morning with swelling to left eye. Pt reports soreness to left eye when blinking. Pt has used warm compresses and 1-2 drops of an otc allergy eyedrops to left eye.  Pt was seen here in the past for subconjunctival hemorrhage.  Pt  Denies drainage, itch, visual disturbances to left eye. Pt denies sore throat. Pt reports sleep apnea and is soon to have a sleep study.   Pt also c/o sinus congestion due to allergies     Past Medical History  Diagnosis Date  . UTI (urinary tract infection)   . HTN (hypertension)   . Lump or mass in breast   . Leukocytosis   . Hyperglycemia   . Malaise and fatigue   . Morbid obesity   . Sleep apnea   . Depressed   . Low back pain   . Sciatica   . Gallstones    Past Surgical History  Procedure Laterality Date  . Cholecystectomy  06/22/2012    Procedure: LAPAROSCOPIC CHOLECYSTECTOMY;  Surgeon: Jamesetta So, MD;  Location: [facility];  Service: General;  Laterality: N/A;   Family History  Problem Relation Age of Onset  . Diabetes Mother   . Rheumatic fever Mother 70  . Migraines Father 30  . Hypertension Father   . Autism Brother    History  Substance Use Topics  . Smoking status: Never Smoker   . Smokeless tobacco: Not on file  . Alcohol Use: No   OB History   Grav Para Term Preterm Abortions TAB SAB Ect Mult Living                 Review of Systems  Constitutional: Negative for fever  and chills.  HENT: Positive for congestion and sinus pressure. Negative for sore throat.   Eyes: Positive for pain. Negative for photophobia, discharge, redness, itching and visual disturbance.      Allergies  Amoxicillin; Penicillins; Dilaudid; and Latex  Home Medications   Prior to Admission medications   Medication Sig Start Date End Date Taking? Authorizing Provider  benzonatate (TESSALON) 200 MG capsule Take 1 capsule (200 mg total) by mouth 3 (three) times daily as needed for cough. 11/21/13   Evalee Jefferson, PA-C  chlorpheniramine-HYDROcodone (TUSSIONEX PENNKINETIC ER) 10-8 MG/5ML LQCR Take 5 mLs by mouth every 12 (twelve) hours as needed for cough. 11/21/13   Evalee Jefferson, PA-C  naproxen sodium (ANAPROX) 220 MG tablet Take 220-440 mg by mouth daily as needed (cramps).    Historical Provider, MD   BP 151/86  Pulse 74  Temp(Src) 98.3 F (36.8 C) (Oral)  Resp 16  Ht 5\' 5"  (1.651 m)  Wt 216 lb (97.977 kg)  BMI 35.94 kg/m2  SpO2 99%  LMP 01/02/2014 Physical Exam  Nursing note and vitals reviewed. Constitutional: She appears well-developed and well-nourished.  HENT:  Head: Normocephalic and atraumatic.  Nose: No rhinorrhea.  Mouth/Throat: Oropharynx is clear and moist.  No facial swelling  Eyes: Conjunctivae and EOM are normal. Pupils are equal, round, and reactive to light. Right eye exhibits no chemosis and no exudate. Left eye exhibits hordeolum. Left eye exhibits no chemosis and no exudate. No scleral icterus.  small stye at medial canthus at left eye which is not pointing or draining Tear duct does not appear to be affected.  No purulent or any drainage from the duct with gentle pressure.  Neck: Normal range of motion.  Cardiovascular: Normal rate, regular rhythm, normal heart sounds and intact distal pulses.   Pulmonary/Chest: Effort normal and breath sounds normal. She has no wheezes.  Abdominal: Soft. Bowel sounds are normal. There is no tenderness.  Musculoskeletal: Normal  range of motion.  Lymphadenopathy:    She has no cervical adenopathy.  Neurological: She is alert.  Skin: Skin is warm and dry.  Psychiatric: She has a normal mood and affect.    ED Course  Procedures  11:22 AM-Discussed treatment plan which includes ointment for left eye. Pt advised to come back if symptoms to left eye worsens over the next week.   Labs Review Labs Reviewed - No data to display  Imaging Review No results found.   EKG Interpretation None      MDM   Final diagnoses:  Stye    Continue warm compresses, avoid squeezing, given erythromycin ophthalmic ointment to apply to eye lid.  Recheck by pcp if not improved or for worsened swelling.  I personally performed the services described in this documentation, which was scribed in my presence. The recorded information has been reviewed and is accurate.     Evalee Jefferson, PA-C 01/09/14 [phone]

## 2014-01-10 NOTE — ED Provider Notes (Signed)
Medical screening examination/treatment/procedure(s) were performed by non-physician practitioner and as supervising physician I was immediately available for consultation/collaboration.   EKG Interpretation None        Wynetta Fines, MD 01/10/14 2248

## 2014-01-25 ENCOUNTER — Encounter: Payer: Self-pay | Admitting: Pulmonary Disease

## 2014-01-25 ENCOUNTER — Encounter: Payer: Self-pay | Admitting: *Deleted

## 2014-01-25 ENCOUNTER — Ambulatory Visit (INDEPENDENT_AMBULATORY_CARE_PROVIDER_SITE_OTHER): Payer: 59 | Admitting: Pulmonary Disease

## 2014-01-25 VITALS — BP 118/72 | HR 97 | Temp 98.1°F | Ht 66.0 in | Wt 288.8 lb

## 2014-01-25 DIAGNOSIS — G4733 Obstructive sleep apnea (adult) (pediatric): Secondary | ICD-10-CM

## 2014-01-25 NOTE — Assessment & Plan Note (Signed)
The patient's history is suspicious for clinically significant obstructive sleep apnea. I have had a long discussion with her about sleep-disordered breathing, including its impact to her quality of life and cardiovascular health. We have been unable to locate her study from 2008, and therefore she will need to have a repeat study for diagnosis. I think she is an excellent candidate for home sleep testing, and the patient is agreeable to this approach.

## 2014-01-25 NOTE — Progress Notes (Signed)
Subjective:    Patient ID: Julie Berry, female    DOB: March 25, 1981, 33 y.o.   MRN: [id number]  HPI The patient is a 33 year old female who I've been asked to see for possible obstructive sleep apnea. She has had a sleep study in 2008, and was told that she had obstructive sleep apnea. She did not start on CPAP at that time, because she felt that she could lose weight. The patient has not been successful doing this, and now she is having increasing symptoms at night and during the day. She notes loud snoring, but no one has mentioned an abnormal breathing pattern during sleep. However, her husband sleeps in another room because of snoring.  The patient has frequent awakenings at night, and does not feel rested in the mornings upon arising. She notes definite sleep pressure during the day with inactivity, and is concerned about her memory and concentration. She will fall asleep easily watching television in the evenings, and does have sleep pressure at times driving longer distances.  The patient states that her weight is up 10-15 pounds since her study in 2008, and her Epworth score today is 15.   Sleep Questionnaire What time do you typically go to bed?( Between what hours) 930-10:30pm 930-10:30pm at 1529 on 01/25/14 by Lilli Few, CMA How long does it take you to fall asleep? 30 minutes 30 minutes at 1529 on 01/25/14 by Lilli Few, CMA How many times during the night do you wake up? 2 2 at 1529 on 01/25/14 by Lilli Few, CMA What time do you get out of bed to start your day? 0600 0600 at 1529 on 01/25/14 by Lilli Few, CMA Do you drive or operate heavy machinery in your occupation? Yes Yes at 1529 on 01/25/14 by Lilli Few, CMA How much has your weight changed (up or down) over the past two years? (In pounds) 10 lb (4.536 kg) 10 lb (4.536 kg) at 1529 on 01/25/14 by Lilli Few, CMA Have you ever had a sleep study before?  Yes Yes at 1529 on 01/25/14 by Lilli Few, CMA If yes, location of study? If yes, date of study? 2008 2008 at 1529 on 01/25/14 by Lilli Few, CMA Do you currently use CPAP? No No at 1529 on 01/25/14 by Lilli Few, CMA Do you wear oxygen at any time? No No at 1529 on 01/25/14 by Lilli Few, CMA   Review of Systems  Constitutional: Negative for fever and unexpected weight change.  HENT: Negative for congestion, dental problem, ear pain, nosebleeds, postnasal drip, rhinorrhea, sinus pressure, sneezing, sore throat and trouble swallowing.   Eyes: Negative for redness and itching.  Respiratory: Positive for cough. Negative for chest tightness, shortness of breath and wheezing.   Cardiovascular: Positive for leg swelling. Negative for palpitations.  Gastrointestinal: Negative for nausea and vomiting.  Genitourinary: Negative for dysuria.  Musculoskeletal: Negative for joint swelling.  Skin: Negative for rash.  Neurological: Negative for headaches.  Hematological: Does not bruise/bleed easily.  Psychiatric/Behavioral: Negative for dysphoric mood. The patient is not nervous/anxious.        Objective:   Physical Exam Constitutional:  Obese female, no acute distress  HENT:  Nares patent without discharge  Oropharynx without exudate, palate and uvula are moderately elongated, mild tonsillar hypertrophy  Eyes:  Perrla, eomi, no scleral icterus  Neck:  No JVD, no TMG  Cardiovascular:  Normal rate, regular rhythm, no rubs or gallops.  No  murmurs        Intact distal pulses  Pulmonary :  Normal breath sounds, no stridor or respiratory distress   No rales, rhonchi, or wheezing  Abdominal:  Soft, nondistended, bowel sounds present.  No tenderness noted.   Musculoskeletal:  minimal lower extremity edema noted.  Lymph Nodes:  No cervical lymphadenopathy noted  Skin:  No cyanosis noted  Neurologic:  Alert, appropriate, moves all 4 extremities  without obvious deficit.         Assessment & Plan:

## 2014-01-25 NOTE — Patient Instructions (Signed)
Will arrange for home sleep testing, and will call to set up followup visit once the results are available.  Work on weight reduction.

## 2014-02-01 ENCOUNTER — Telehealth: Payer: Self-pay | Admitting: Pulmonary Disease

## 2014-02-01 DIAGNOSIS — G4733 Obstructive sleep apnea (adult) (pediatric): Secondary | ICD-10-CM

## 2014-02-01 NOTE — Telephone Encounter (Signed)
[id number] x1 

## 2014-02-02 ENCOUNTER — Other Ambulatory Visit: Payer: Self-pay | Admitting: Pulmonary Disease

## 2014-02-02 DIAGNOSIS — G4733 Obstructive sleep apnea (adult) (pediatric): Secondary | ICD-10-CM

## 2014-02-02 NOTE — Telephone Encounter (Signed)
Called and spoke to pt. Pt stated she would prefer a facility sleep study over a home sleep study d/t personal reason per pt. [place] please advise.   Last OV 01/25/2014: Will arrange for home sleep testing, and will call to set up followup visit once the results are available.  Work on weight reduction.

## 2014-02-02 NOTE — Telephone Encounter (Signed)
Ok.  Order sent for npsg at sleep center.

## 2014-02-02 NOTE — Telephone Encounter (Signed)
Order has been placed for pt. Nothing further needed

## 2014-02-28 ENCOUNTER — Emergency Department (HOSPITAL_COMMUNITY): Payer: 59

## 2014-02-28 ENCOUNTER — Encounter (HOSPITAL_COMMUNITY): Payer: Self-pay | Admitting: Emergency Medicine

## 2014-02-28 ENCOUNTER — Emergency Department (HOSPITAL_COMMUNITY)
Admission: EM | Admit: 2014-02-28 | Discharge: 2014-02-28 | Disposition: A | Discharge status: Home / Self Care | Payer: 59 | Attending: Emergency Medicine | Admitting: Emergency Medicine

## 2014-02-28 DIAGNOSIS — S99929A Unspecified injury of unspecified foot, initial encounter: Principal | ICD-10-CM

## 2014-02-28 DIAGNOSIS — Z8659 Personal history of other mental and behavioral disorders: Secondary | ICD-10-CM | POA: Insufficient documentation

## 2014-02-28 DIAGNOSIS — Y9289 Other specified places as the place of occurrence of the external cause: Secondary | ICD-10-CM | POA: Insufficient documentation

## 2014-02-28 DIAGNOSIS — Z87440 Personal history of urinary (tract) infections: Secondary | ICD-10-CM | POA: Insufficient documentation

## 2014-02-28 DIAGNOSIS — Z880 Allergy status to penicillin: Secondary | ICD-10-CM | POA: Insufficient documentation

## 2014-02-28 DIAGNOSIS — Z8742 Personal history of other diseases of the female genital tract: Secondary | ICD-10-CM | POA: Insufficient documentation

## 2014-02-28 DIAGNOSIS — IMO0002 Reserved for concepts with insufficient information to code with codable children: Secondary | ICD-10-CM | POA: Insufficient documentation

## 2014-02-28 DIAGNOSIS — Z8719 Personal history of other diseases of the digestive system: Secondary | ICD-10-CM | POA: Insufficient documentation

## 2014-02-28 DIAGNOSIS — M545 Low back pain, unspecified: Secondary | ICD-10-CM | POA: Insufficient documentation

## 2014-02-28 DIAGNOSIS — Z91040 Latex allergy status: Secondary | ICD-10-CM | POA: Insufficient documentation

## 2014-02-28 DIAGNOSIS — M1710 Unilateral primary osteoarthritis, unspecified knee: Secondary | ICD-10-CM | POA: Insufficient documentation

## 2014-02-28 DIAGNOSIS — M1711 Unilateral primary osteoarthritis, right knee: Secondary | ICD-10-CM

## 2014-02-28 DIAGNOSIS — S8990XA Unspecified injury of unspecified lower leg, initial encounter: Secondary | ICD-10-CM | POA: Insufficient documentation

## 2014-02-28 DIAGNOSIS — X500XXA Overexertion from strenuous movement or load, initial encounter: Secondary | ICD-10-CM | POA: Insufficient documentation

## 2014-02-28 DIAGNOSIS — S99919A Unspecified injury of unspecified ankle, initial encounter: Principal | ICD-10-CM

## 2014-02-28 DIAGNOSIS — Z8669 Personal history of other diseases of the nervous system and sense organs: Secondary | ICD-10-CM | POA: Insufficient documentation

## 2014-02-28 DIAGNOSIS — Y9389 Activity, other specified: Secondary | ICD-10-CM | POA: Insufficient documentation

## 2014-02-28 DIAGNOSIS — Z862 Personal history of diseases of the blood and blood-forming organs and certain disorders involving the immune mechanism: Secondary | ICD-10-CM | POA: Insufficient documentation

## 2014-02-28 DIAGNOSIS — I10 Essential (primary) hypertension: Secondary | ICD-10-CM | POA: Insufficient documentation

## 2014-02-28 MED ORDER — DEXAMETHASONE 6 MG PO TABS: ORAL_TABLET | ORAL | Status: DC

## 2014-02-28 MED ORDER — DICLOFENAC SODIUM 75 MG PO TBEC: 75.0000 mg | DELAYED_RELEASE_TABLET | Freq: Two times a day (BID) | ORAL | Status: DC

## 2014-02-28 MED ORDER — HYDROCODONE-ACETAMINOPHEN 5-325 MG PO TABS: 1.0000 | ORAL_TABLET | ORAL | Status: DC | PRN

## 2014-02-28 NOTE — ED Provider Notes (Signed)
Medical screening examination/treatment/procedure(s) were performed by non-physician practitioner and as supervising physician I was immediately available for consultation/collaboration.  Richarda Blade, MD 02/28/14 [phone]

## 2014-02-28 NOTE — Discharge Instructions (Signed)
Your x-ray reveals some arthritis involving your right knee. Your examination suggested pain with range of motion and with palpation of your lower back. Please see Dr. Lorin Mercy concerning both of these. Please use Decadron and diclofenac daily with food. Use Norco for sudden severe pain. This medication may cause drowsiness, please use with caution. Wear and Tear Disorders of the Knee (Arthritis, Osteoarthritis) Everyone will experience wear and tear injuries (arthritis, osteoarthritis) of the knee. These are the changes we all get as we age. They come from the joint stress of daily living. The amount of cartilage damage in your knee and your symptoms determine if you need surgery. Mild problems require approximately two months recovery time. More severe problems take several months to recover. With mild problems, your surgeon may find worn and rough cartilage surfaces. With severe changes, your surgeon may find cartilage that has completely worn away and exposed the bone. Loose bodies of bone and cartilage, bone spurs (excess bone growth), and injuries to the menisci (cushions between the large bones of your leg) are also common. All of these problems can cause pain. For a mild wear and tear problem, rough cartilage may simply need to be shaved and smoothed. For more severe problems with areas of exposed bone, your surgeon may use an instrument for roughing up the bone surfaces to stimulate new cartilage growth. Loose bodies are usually removed. Torn menisci may be trimmed or repaired. ABOUT THE ARTHROSCOPIC PROCEDURE Arthroscopy is a surgical technique. It allows your orthopedic surgeon to diagnose and treat your knee injury with accuracy. The surgeon looks into your knee through a small scope. The scope is like a small (pencil-sized) telescope. Arthroscopy is less invasive than open knee surgery. You can expect a more rapid recovery. After the procedure, you will be moved to a recovery area until most of the  effects of the medication have worn off. Your caregiver will discuss the test results with you. RECOVERY The severity of the arthritis and the type of procedure performed will determine recovery time. Other important factors include age, physical condition, medical conditions, and the type of rehabilitation program. Strengthening your muscles after arthroscopy helps guarantee a better recovery. Follow your caregiver's instructions. Use crutches, rest, elevate, ice, and do knee exercises as instructed. Your caregivers will help you and instruct you with exercises and other physical therapy required to regain your mobility, muscle strength, and functioning following surgery. Only take over-the-counter or prescription medicines for pain, discomfort, or fever as directed by your caregiver.  SEEK MEDICAL CARE IF:   There is increased bleeding (more than a small spot) from the wound.  You notice redness, swelling, or increasing pain in the wound.  Pus is coming from wound.  You develop an unexplained oral temperature above 102 F (38.9 C) , or as your caregiver suggests.  You notice a foul smell coming from the wound or dressing.  You have severe pain with motion of the knee. SEEK IMMEDIATE MEDICAL CARE IF:   You develop a rash.  You have difficulty breathing.  You have any allergic problems. MAKE SURE YOU:   Understand these instructions.  Will watch your condition.  Will get help right away if you are not doing well or get worse. Document Released: 08/02/2000 Document Revised: 10/28/2011 Document Reviewed: 12/30/2007 [facility] Patient Information 2015 [place], [place]. This information is not intended to replace advice given to you by your health care provider. Make sure you discuss any questions you have with your health care provider.

## 2014-02-28 NOTE — ED Provider Notes (Signed)
CSN: [id number]     Arrival date & time 02/28/14  1015 History  This chart was scribed for non-physician practitioner, Lily Kocher, PA-C working with Richarda Blade, MD by Einar Pheasant, ED scribe. This patient was seen in room APFT23/APFT23 and the patient's care was started at 12:00 AM.    Chief Complaint  Patient presents with  . Knee Pain   The history is provided by the patient and a relative. No language interpreter was used.   HPI Comments: Julie Berry is a 33 y.o. female who presents to the Emergency Department complaining of worsening right knee pain that started approximately 1 day ago. Pt states that yesterday she bent down and heard her right knee "pop". She states that the pain has been worsening. Currently she cannot elevate her right knee. Pt was barefooted when the injury occurred. She states that in the past she had problems with her knees giving out on her, however, she has never followed up with a specialist. Relative reports that this past weekend pt wore heels (wedges 3 inches in height), which she hasn't done in a while. Denis any recent knee surgery or fever.    Past Medical History  Diagnosis Date  . UTI (urinary tract infection)   . HTN (hypertension)   . Lump or mass in breast   . Leukocytosis   . Hyperglycemia   . Malaise and fatigue   . Morbid obesity   . Sleep apnea   . Depressed   . Low back pain   . Sciatica   . Gallstones    Past Surgical History  Procedure Laterality Date  . Cholecystectomy  06/22/2012    Procedure: LAPAROSCOPIC CHOLECYSTECTOMY;  Surgeon: Jamesetta So, MD;  Location: [facility];  Service: General;  Laterality: N/A;   Family History  Problem Relation Age of Onset  . Diabetes Mother   . Rheumatic fever Mother 47  . Migraines Father 53  . Hypertension Father   . Autism Brother    History  Substance Use Topics  . Smoking status: Never Smoker   . Smokeless tobacco: Not on file  . Alcohol Use: No   OB History   Grav  Para Term Preterm Abortions TAB SAB Ect Mult Living                 Review of Systems  Constitutional: Negative for fever and diaphoresis.  Musculoskeletal: Positive for arthralgias and joint swelling. Negative for myalgias.  Neurological: Negative for weakness and numbness.  All other systems reviewed and are negative.     Allergies  Amoxicillin; Penicillins; Dilaudid; and Latex  Home Medications   Prior to Admission medications   Not on File   BP 140/68  Pulse 66  Temp(Src) 98.1 F (36.7 C)  Resp 16  SpO2 98%  LMP 02/02/2014  Physical Exam  Nursing note and vitals reviewed. Constitutional: She is oriented to person, place, and time. She appears well-developed and well-nourished. No distress.  HENT:  Head: Normocephalic and atraumatic.  Eyes: Conjunctivae and EOM are normal.  Neck: Neck supple. No tracheal deviation present.  Cardiovascular: Normal rate.   Pulmonary/Chest: Effort normal. No respiratory distress.  Musculoskeletal: Normal range of motion. She exhibits edema and tenderness.  Right knee/Leg: patella is midline. No deformities of the quadricept area . Mild to moderate increase swelling to anterior fibula tuberosity. No swelling or cords of the calf. Achillis tendon is intact. Dorsalis pedis is 2+ bilaterally. Posterior tibial 2+ bilaterally. Capillary refill is  less than two seconds. No lesions between the toes bilaterally. Mild crepitus with flexion and extension on the right.   Lumbar Spine: no palpable step offs of the lumbar spine. Tenderness at the mid lumbar and lower lumbar area. Straight leg raise at 40 degrees on the right.  Neurological: She is alert and oriented to person, place, and time.  Skin: Skin is warm and dry.  Psychiatric: She has a normal mood and affect. Her behavior is normal.    ED Course  Procedures (including critical care time)  DIAGNOSTIC STUDIES: Oxygen Saturation is 98% on RA, normal by my interpretation.    COORDINATION  OF CARE: 12:12 PM-Will order X-ray of the right knee. Pt will be given a referral to a local orthopaedist. Will prescribe medication for pain control. Pt advised of plan for treatment and pt agrees.  1:44 PM- X-rays were reviewed. The course of plan discussed with pt will include knee immobilizer, antiinflammatory medication, pain medication, and consult with orthopaedist. Pt agrees with the treatment course.  Imaging Review Dg Lumbar Spine Complete  02/28/2014   CLINICAL DATA:  33 year old female with back pain following injury  EXAM: LUMBAR SPINE - COMPLETE 4+ VIEW  COMPARISON:  05/18/2012 radiographs  FINDINGS: Normal alignment noted.  There is no evidence of acute fracture or subluxation.  No focal bony abnormalities are present.  IMPRESSION: Negative.   Electronically Signed   By: Hassan Rowan M.D.   On: 02/28/2014 13:18   Dg Knee Complete 4 Views Right  02/28/2014   CLINICAL DATA:  Knee pain, fell in a hole yesterday  EXAM: RIGHT KNEE - COMPLETE 4+ VIEW  COMPARISON:  None  FINDINGS: Question minimal medial compartment joint space narrowing.  Osseous mineralization normal.  No acute fracture, dislocation or bone destruction.  No knee joint effusion or regional soft tissue abnormalities.  IMPRESSION: No radiographic evidence of acute injury.   Electronically Signed   By: Lavonia Dana M.D.   On: 02/28/2014 13:14    MDM X-ray of the knee suggest degenerative joint disease changes in the medial compartment joint space. No significant abnormality noted of the lumbar spine on plain film. Because the patient has some pain with range of motion or straight leg raises, I have suggested that she see Dr. Lorin Mercy concerning her back as well as for evaluation of her knee. The patient is fitted with a knee immobilizer. Prescription for Decadron, diclofenac, and Norco given to the patient.    Final diagnoses:  None    *I have reviewed nursing notes, vital signs, and all appropriate lab and imaging results for this  patient.**  I personally performed the services described in this documentation, which was scribed in my presence. The recorded information has been reviewed and is accurate.    Lenox Ahr, PA-C 02/28/14 1359

## 2014-02-28 NOTE — ED Notes (Signed)
Reports a " popping" to rt knee. Difficulty per pt with ambulation. Ambulated to room.

## 2014-03-01 ENCOUNTER — Ambulatory Visit (HOSPITAL_BASED_OUTPATIENT_CLINIC_OR_DEPARTMENT_OTHER): Payer: 59

## 2014-03-14 ENCOUNTER — Ambulatory Visit (HOSPITAL_BASED_OUTPATIENT_CLINIC_OR_DEPARTMENT_OTHER): Payer: 59 | Attending: Pulmonary Disease | Admitting: Radiology

## 2014-03-14 VITALS — Ht 66.0 in | Wt 288.0 lb

## 2014-03-14 DIAGNOSIS — G4730 Sleep apnea, unspecified: Secondary | ICD-10-CM | POA: Diagnosis present

## 2014-03-14 DIAGNOSIS — G4733 Obstructive sleep apnea (adult) (pediatric): Secondary | ICD-10-CM | POA: Diagnosis not present

## 2014-03-14 DIAGNOSIS — G4710 Hypersomnia, unspecified: Secondary | ICD-10-CM | POA: Diagnosis present

## 2014-03-17 ENCOUNTER — Telehealth: Payer: Self-pay | Admitting: Pulmonary Disease

## 2014-03-17 NOTE — Telephone Encounter (Signed)
Pt had sleep study 03/14/14. Pt aware it can take up 2 weeks for sleep results once done. Please advise Dr. Gwenette Greet thanks

## 2014-03-18 ENCOUNTER — Encounter (HOSPITAL_BASED_OUTPATIENT_CLINIC_OR_DEPARTMENT_OTHER): Payer: 59

## 2014-03-21 ENCOUNTER — Ambulatory Visit (HOSPITAL_COMMUNITY)
Admission: RE | Admit: 2014-03-21 | Discharge: 2014-03-21 | Disposition: A | Discharge status: Home / Self Care | Payer: 59 | Source: Ambulatory Visit | Attending: Orthopaedic Surgery | Admitting: Orthopaedic Surgery

## 2014-03-21 DIAGNOSIS — R262 Difficulty in walking, not elsewhere classified: Secondary | ICD-10-CM | POA: Insufficient documentation

## 2014-03-21 DIAGNOSIS — R29898 Other symptoms and signs involving the musculoskeletal system: Secondary | ICD-10-CM

## 2014-03-21 DIAGNOSIS — M25569 Pain in unspecified knee: Secondary | ICD-10-CM | POA: Insufficient documentation

## 2014-03-21 DIAGNOSIS — M25561 Pain in right knee: Secondary | ICD-10-CM | POA: Insufficient documentation

## 2014-03-21 DIAGNOSIS — IMO0001 Reserved for inherently not codable concepts without codable children: Secondary | ICD-10-CM | POA: Insufficient documentation
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
## 2014-03-23 DIAGNOSIS — G4730 Sleep apnea, unspecified: Secondary | ICD-10-CM

## 2014-03-23 DIAGNOSIS — G4710 Hypersomnia, unspecified: Secondary | ICD-10-CM

## 2014-03-23 NOTE — Progress Notes (Signed)
Pt needs ov to review sleep study.  Thanks.  

## 2014-03-23 NOTE — Sleep Study (Signed)
   NAME: Julie Berry DATE OF BIRTH:  29-Mar-1981 MEDICAL RECORD NUMBER [id number]  LOCATION: [facility]  PHYSICIAN: Kathee Delton  DATE OF STUDY: 03/14/2014  SLEEP STUDY TYPE: Nocturnal Polysomnogram               REFERRING PHYSICIAN: Clance, Armando Reichert, MD  INDICATION FOR STUDY: Hypersomnia with sleep apnea  EPWORTH SLEEPINESS SCORE:  9 HEIGHT: 5\' 6"  (167.6 cm)  WEIGHT: 288 lb (130.636 kg)    Body mass index is 46.51 kg/(m^2).  NECK SIZE: 16 in.  MEDICATIONS: Reviewed in the medical record  SLEEP ARCHITECTURE: The patient had a total sleep time of 360 minutes, with decreased slow-wave sleep for age, and only 26 minutes of REM. Sleep onset latency was normal at 26 minutes, and REM onset was very delayed. Sleep efficiency was mildly decreased at 86%.  RESPIRATORY DATA: The patient was found to have no apneas and only 28 obstructive hypopneas, giving her an AHI of 5 events per hour.  The events occurred primarily in the supine position, and there was moderate snoring noted throughout.  OXYGEN DATA: There was transient oxygen desaturation as low as 84% with the patient's obstructive events.  CARDIAC DATA: No clinically significant arrhythmias were seen  MOVEMENT/PARASOMNIA: Periodic limb movements were not present, and no abnormal behaviors were seen.  IMPRESSION/ RECOMMENDATION:    1) minimal obstructive sleep apnea/hypopnea syndrome, with an AHI of 5 events per hour and oxygen desaturation as low as 84%. Treatment for this degree of sleep apnea can be a trial of weight loss alone, upper airway surgery, dental appliance, and also CPAP. Clinical correlation is suggested.     [place], American Board of Sleep Medicine  ELECTRONICALLY SIGNED ON:  03/23/2014, 6:32 PM [place] PH: [phone]   FX: [phone] [place]

## 2014-03-24 NOTE — Telephone Encounter (Signed)
Per [facility] schedule pt an appt to review results. Appt set for 9-10 per pt requests. [place] Bing, CMA

## 2014-04-06 ENCOUNTER — Ambulatory Visit (HOSPITAL_COMMUNITY): Payer: 59

## 2014-04-08 ENCOUNTER — Inpatient Hospital Stay (HOSPITAL_COMMUNITY)
Admission: RE | Admit: 2014-04-08 | Discharge: 2014-04-08 | Disposition: A | Discharge status: Home / Self Care | Payer: 59 | Source: Ambulatory Visit | Attending: Physical Therapy | Admitting: Physical Therapy

## 2014-04-08 ENCOUNTER — Telehealth (HOSPITAL_COMMUNITY): Payer: Self-pay

## 2014-04-08 DIAGNOSIS — M25561 Pain in right knee: Secondary | ICD-10-CM

## 2014-04-08 DIAGNOSIS — R29898 Other symptoms and signs involving the musculoskeletal system: Secondary | ICD-10-CM

## 2014-04-08 DIAGNOSIS — R262 Difficulty in walking, not elsewhere classified: Secondary | ICD-10-CM

## 2014-04-13 ENCOUNTER — Ambulatory Visit (HOSPITAL_COMMUNITY)
Admission: RE | Admit: 2014-04-13 | Discharge: 2014-04-13 | Disposition: A | Discharge status: Home / Self Care | Payer: 59 | Source: Ambulatory Visit | Attending: Family Medicine | Admitting: Family Medicine

## 2014-04-13 DIAGNOSIS — R262 Difficulty in walking, not elsewhere classified: Secondary | ICD-10-CM | POA: Diagnosis not present

## 2014-04-13 DIAGNOSIS — R29898 Other symptoms and signs involving the musculoskeletal system: Secondary | ICD-10-CM | POA: Diagnosis not present

## 2014-04-13 DIAGNOSIS — M25569 Pain in unspecified knee: Secondary | ICD-10-CM | POA: Diagnosis present

## 2014-04-13 DIAGNOSIS — IMO0001 Reserved for inherently not codable concepts without codable children: Secondary | ICD-10-CM | POA: Diagnosis present

## 2014-04-13 NOTE — Progress Notes (Signed)
Physical Therapy Treatment Patient Details  Name: Julie Berry MRN: [id number] Date of Birth: 17-Apr-1981  Today's Date: 04/13/2014 Time: 8127-5170 PT Time Calculation (min): 45 min    Charges: TherEx [id number] Visit#: 2 of 8  Re-eval: 04/20/14 Assessment Diagnosis: sublux patella Next MD Visit: 04/12/2014 Prior Therapy: none  Authorization: [employer]  Authorization Time Period:    Authorization Visit#:   of     Subjective: Symptoms/Limitations Symptoms: Patient notes minor improvement in pain following last session but is hesitant to perform certain activities due to fear of further knee injury. Notes she does not do stairs at home due to fear of injury.   Precautions/Restrictions     Exercise/Treatments Stretches Active Hamstring Stretch: 5 reps;Limitations Active Hamstring Stretch Limitations: 3way 3 second hold to 8"  Hip Flexor Stretch: 5 reps;Limitations Hip Flexor Stretch Limitations: 3way hip flexor ansd 3way groin 10x 3" to 8"  ITB Stretch: Limitations ITB Stretch Limitations: 10x 3seconds Piriformis Stretch: Limitations Piriformis Stretch Limitations: 10x 3sec Standing Forward Lunges: 20 reps;Limitations Forward Lunges Limitations: static on floor Other Standing Knee Exercises: 3D hip excursion 10x  Physical Therapy Assessment and Plan PT Assessment and Plan Clinical Impression Statement: Patine displays gait upon arrival with excessive Rt lateral toe whip and excessive Rt knee valgus moment.  Following LE stretches and lunges with functional manual reaction techniques patient displayed improved gait noting "it feels better!" Noted significant improvement following ITBand stretch.  PT Plan: Conitnue to utilize stretches as needed to improve gait and decrease pain/stiffness. Progress strengtheing exercises including heel raise, functional squats, rockerboard, step ups, terminal knee extension as pain allows.     Goals PT Short Term Goals PT Short Term Goal 1: Pt   to feel confident walking inside without the cane PT Short Term Goal 1 - Progress: Progressing toward goal PT Short Term Goal 2: PT to be able to sit for an hour without increased pain to better be able to perform job duties  PT Short Term Goal 2 - Progress: Progressing toward goal PT Short Term Goal 3: Pt pain to be no greater than a 5/10 80% of the time PT Short Term Goal 3 - Progress: Progressing toward goal  Problem List Patient Active Problem List   Diagnosis Date Noted  . Knee pain, right 03/21/2014  . Difficulty in walking(719.7) 03/21/2014  . Weakness of left leg 03/21/2014  . OSA (obstructive sleep apnea) 01/25/2014  . NEOPLASM OF UNCERTAIN BEHAVIOR OF SKIN 05/01/2009  . DYSURIA 04/13/2009  . 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 LOW HDL 09/30/2008  . ADULT SEXUAL ABUSE NEC 06/16/2008  . HYPERTENSION 02/26/2007  . HYPERGLYCEMIA 02/26/2007  . MORBID OBESITY 02/13/2007  . DEPRESSION 02/13/2007    PT - End of Session Activity Tolerance: Patient tolerated treatment well General Behavior During Therapy: [facility] for tasks assessed/performed  GP    Kasten Leveque R 04/13/2014, 8:42 AM

## 2014-04-15 ENCOUNTER — Ambulatory Visit (HOSPITAL_COMMUNITY): Payer: 59 | Admitting: Physical Therapy

## 2014-04-19 ENCOUNTER — Telehealth (HOSPITAL_COMMUNITY): Payer: Self-pay

## 2014-04-19 ENCOUNTER — Ambulatory Visit (HOSPITAL_COMMUNITY): Payer: 59 | Admitting: Physical Therapy

## 2014-04-20 ENCOUNTER — Ambulatory Visit (HOSPITAL_COMMUNITY): Payer: 59

## 2014-04-21 ENCOUNTER — Ambulatory Visit (HOSPITAL_COMMUNITY): Payer: 59 | Admitting: Physical Therapy

## 2014-04-22 ENCOUNTER — Ambulatory Visit (HOSPITAL_COMMUNITY): Payer: 59 | Admitting: Physical Therapy

## 2014-04-27 ENCOUNTER — Ambulatory Visit (HOSPITAL_COMMUNITY): Payer: 59

## 2014-04-28 ENCOUNTER — Ambulatory Visit: Payer: 59 | Admitting: Pulmonary Disease

## 2014-04-29 ENCOUNTER — Ambulatory Visit (HOSPITAL_COMMUNITY): Payer: 59 | Admitting: Physical Therapy

## 2014-05-02 ENCOUNTER — Encounter: Payer: Self-pay | Admitting: Pulmonary Disease

## 2014-05-02 ENCOUNTER — Inpatient Hospital Stay (HOSPITAL_COMMUNITY): Admission: RE | Admit: 2014-05-02 | Payer: 59 | Source: Ambulatory Visit | Admitting: Physical Therapy

## 2014-05-04 ENCOUNTER — Ambulatory Visit (HOSPITAL_COMMUNITY): Payer: 59 | Attending: Orthopaedic Surgery | Admitting: Physical Therapy

## 2014-05-04 DIAGNOSIS — R262 Difficulty in walking, not elsewhere classified: Secondary | ICD-10-CM | POA: Insufficient documentation

## 2014-05-04 DIAGNOSIS — IMO0001 Reserved for inherently not codable concepts without codable children: Secondary | ICD-10-CM | POA: Insufficient documentation

## 2014-05-04 DIAGNOSIS — M25569 Pain in unspecified knee: Secondary | ICD-10-CM | POA: Insufficient documentation

## 2014-05-04 DIAGNOSIS — R29898 Other symptoms and signs involving the musculoskeletal system: Secondary | ICD-10-CM | POA: Insufficient documentation

## 2014-05-06 ENCOUNTER — Ambulatory Visit (HOSPITAL_COMMUNITY)
Admission: RE | Admit: 2014-05-06 | Payer: 59 | Source: Ambulatory Visit | Attending: Family Medicine | Admitting: Family Medicine

## 2014-05-18 ENCOUNTER — Encounter (HOSPITAL_COMMUNITY): Payer: Self-pay | Admitting: Emergency Medicine

## 2014-05-18 DIAGNOSIS — R739 Hyperglycemia, unspecified: Secondary | ICD-10-CM | POA: Insufficient documentation

## 2014-05-18 DIAGNOSIS — Z862 Personal history of diseases of the blood and blood-forming organs and certain disorders involving the immune mechanism: Secondary | ICD-10-CM | POA: Diagnosis not present

## 2014-05-18 DIAGNOSIS — Z9089 Acquired absence of other organs: Secondary | ICD-10-CM | POA: Diagnosis not present

## 2014-05-18 DIAGNOSIS — Z91040 Latex allergy status: Secondary | ICD-10-CM | POA: Diagnosis not present

## 2014-05-18 DIAGNOSIS — Z880 Allergy status to penicillin: Secondary | ICD-10-CM | POA: Insufficient documentation

## 2014-05-18 DIAGNOSIS — R1084 Generalized abdominal pain: Secondary | ICD-10-CM | POA: Diagnosis present

## 2014-05-18 DIAGNOSIS — I10 Essential (primary) hypertension: Secondary | ICD-10-CM | POA: Diagnosis not present

## 2014-05-18 DIAGNOSIS — K5289 Other specified noninfective gastroenteritis and colitis: Secondary | ICD-10-CM | POA: Diagnosis not present

## 2014-05-18 DIAGNOSIS — R51 Headache: Secondary | ICD-10-CM | POA: Diagnosis not present

## 2014-05-18 DIAGNOSIS — Z8739 Personal history of other diseases of the musculoskeletal system and connective tissue: Secondary | ICD-10-CM | POA: Diagnosis not present

## 2014-05-18 DIAGNOSIS — Z3202 Encounter for pregnancy test, result negative: Secondary | ICD-10-CM | POA: Diagnosis not present

## 2014-05-18 DIAGNOSIS — Z791 Long term (current) use of non-steroidal anti-inflammatories (NSAID): Secondary | ICD-10-CM | POA: Insufficient documentation

## 2014-05-18 DIAGNOSIS — Z8659 Personal history of other mental and behavioral disorders: Secondary | ICD-10-CM | POA: Diagnosis not present

## 2014-05-18 DIAGNOSIS — Z7952 Long term (current) use of systemic steroids: Secondary | ICD-10-CM | POA: Insufficient documentation

## 2014-05-18 DIAGNOSIS — Z6841 Body Mass Index (BMI) 40.0 and over, adult: Secondary | ICD-10-CM | POA: Insufficient documentation

## 2014-05-18 DIAGNOSIS — K529 Noninfective gastroenteritis and colitis, unspecified: Secondary | ICD-10-CM | POA: Insufficient documentation

## 2014-05-18 DIAGNOSIS — IMO0002 Reserved for concepts with insufficient information to code with codable children: Secondary | ICD-10-CM | POA: Diagnosis not present

## 2014-05-18 DIAGNOSIS — Z8742 Personal history of other diseases of the female genital tract: Secondary | ICD-10-CM | POA: Diagnosis not present

## 2014-05-18 DIAGNOSIS — Z87440 Personal history of urinary (tract) infections: Secondary | ICD-10-CM | POA: Diagnosis not present

## 2014-05-18 NOTE — ED Notes (Signed)
Pt with abd pain for a week with nausea and diarrhea

## 2014-05-19 ENCOUNTER — Emergency Department (HOSPITAL_COMMUNITY)
Admission: EM | Admit: 2014-05-19 | Discharge: 2014-05-19 | Disposition: A | Discharge status: Home / Self Care | Payer: 59 | Attending: Emergency Medicine | Admitting: Emergency Medicine

## 2014-05-19 DIAGNOSIS — Z7952 Long term (current) use of systemic steroids: Secondary | ICD-10-CM | POA: Diagnosis not present

## 2014-05-19 DIAGNOSIS — Z880 Allergy status to penicillin: Secondary | ICD-10-CM | POA: Diagnosis not present

## 2014-05-19 DIAGNOSIS — R739 Hyperglycemia, unspecified: Secondary | ICD-10-CM | POA: Diagnosis not present

## 2014-05-19 DIAGNOSIS — K529 Noninfective gastroenteritis and colitis, unspecified: Secondary | ICD-10-CM | POA: Diagnosis not present

## 2014-05-19 DIAGNOSIS — I10 Essential (primary) hypertension: Secondary | ICD-10-CM | POA: Diagnosis not present

## 2014-05-19 DIAGNOSIS — Z91040 Latex allergy status: Secondary | ICD-10-CM | POA: Diagnosis not present

## 2014-05-19 DIAGNOSIS — R51 Headache: Secondary | ICD-10-CM | POA: Diagnosis not present

## 2014-05-19 DIAGNOSIS — R1084 Generalized abdominal pain: Secondary | ICD-10-CM | POA: Diagnosis present

## 2014-05-19 DIAGNOSIS — Z791 Long term (current) use of non-steroidal anti-inflammatories (NSAID): Secondary | ICD-10-CM | POA: Diagnosis not present

## 2014-05-19 DIAGNOSIS — Z9089 Acquired absence of other organs: Secondary | ICD-10-CM | POA: Diagnosis not present

## 2014-05-19 DIAGNOSIS — Z6841 Body Mass Index (BMI) 40.0 and over, adult: Secondary | ICD-10-CM | POA: Diagnosis not present

## 2014-05-19 LAB — COMPREHENSIVE METABOLIC PANEL
ALBUMIN: 3.3 g/dL — AB (ref 3.5–5.2)
ALT: 15 U/L (ref 0–35)
AST: 13 U/L (ref 0–37)
Alkaline Phosphatase: 60 U/L (ref 39–117)
Anion gap: 9 (ref 5–15)
BILIRUBIN TOTAL: 0.2 mg/dL — AB (ref 0.3–1.2)
BUN: 13 mg/dL (ref 6–23)
CO2: 28 mEq/L (ref 19–32)
CREATININE: 0.84 mg/dL (ref 0.50–1.10)
Calcium: 8.8 mg/dL (ref 8.4–10.5)
Chloride: 103 mEq/L (ref 96–112)
GFR calc Af Amer: >90 mL/min (ref >90)
GFR calc non Af Amer: 90 mL/min — ABNORMAL LOW (ref >90)
Glucose, Bld: 162 mg/dL — ABNORMAL HIGH (ref 70–99)
Potassium: 3.5 mEq/L — ABNORMAL LOW (ref 3.7–5.3)
Sodium: 140 mEq/L (ref 137–147)
Total Protein: 7.2 g/dL (ref 6.0–8.3)

## 2014-05-19 LAB — CBC WITH DIFFERENTIAL/PLATELET
BASOS ABS: 0 10*3/uL (ref 0.0–0.1)
BASOS PCT: 0 % (ref 0–1)
Eosinophils Absolute: 0.3 10*3/uL (ref 0.0–0.7)
Eosinophils Relative: 3 % (ref 0–5)
HEMATOCRIT: 37.6 % (ref 36.0–46.0)
Hemoglobin: 12.3 g/dL (ref 12.0–15.0)
Lymphocytes Relative: 29 % (ref 12–46)
Lymphs Abs: 2.9 10*3/uL (ref 0.7–4.0)
MCH: 27.6 pg (ref 26.0–34.0)
MCHC: 32.7 g/dL (ref 30.0–36.0)
MCV: 84.3 fL (ref 78.0–100.0)
MONO ABS: 0.6 10*3/uL (ref 0.1–1.0)
Monocytes Relative: 6 % (ref 3–12)
NEUTROS PCT: 62 % (ref 43–77)
Neutro Abs: 6.3 10*3/uL (ref 1.7–7.7)
Platelets: 274 10*3/uL (ref 150–400)
RBC: 4.46 MIL/uL (ref 3.87–5.11)
RDW: 13.1 % (ref 11.5–15.5)
WBC: 10.1 10*3/uL (ref 4.0–10.5)

## 2014-05-19 LAB — URINALYSIS, ROUTINE W REFLEX MICROSCOPIC
Bilirubin Urine: NEGATIVE
GLUCOSE, UA: NEGATIVE mg/dL
Hgb urine dipstick: NEGATIVE
Ketones, ur: NEGATIVE mg/dL
Leukocytes, UA: NEGATIVE
Nitrite: NEGATIVE
PH: 6.5 (ref 5.0–8.0)
Protein, ur: NEGATIVE mg/dL
Specific Gravity, Urine: 1.02 (ref 1.005–1.030)
Urobilinogen, UA: 0.2 mg/dL (ref 0.0–1.0)

## 2014-05-19 LAB — PREGNANCY, URINE: PREG TEST UR: NEGATIVE

## 2014-05-19 MED ORDER — KETOROLAC TROMETHAMINE 30 MG/ML IJ SOLN
30.0000 mg | Freq: Once | INTRAMUSCULAR | Status: AC
  Administered 2014-05-19: 30 mg via INTRAVENOUS
  Filled 2014-05-19: qty 1

## 2014-05-19 MED ORDER — ONDANSETRON HCL 4 MG/2ML IJ SOLN
4.0000 mg | Freq: Once | INTRAMUSCULAR | Status: AC
  Administered 2014-05-19: 4 mg via INTRAVENOUS
  Filled 2014-05-19: qty 2

## 2014-05-19 MED ORDER — PROMETHAZINE HCL 25 MG PO TABS: 25.0000 mg | ORAL_TABLET | Freq: Four times a day (QID) | ORAL | Status: DC | PRN

## 2014-05-19 NOTE — ED Provider Notes (Signed)
CSN: [id number]     Arrival date & time 05/18/14  2219 History  This chart was scribed for Veryl Speak, MD by Lowella Petties, ED Scribe. The patient was seen in room APA11/APA11. Patient's care was started at 12:27 AM.   Chief Complaint  Patient presents with  . Abdominal Pain   Patient is a 33 y.o. female presenting with abdominal pain. The history is provided by the patient. No language interpreter was used.  Abdominal Pain Pain location:  Generalized Pain quality: cramping   Pain radiates to:  Does not radiate Pain severity:  Moderate Onset quality:  Gradual Duration:  1 week Timing:  Constant Progression:  Worsening Chronicity:  New Worsened by:  Nothing tried Ineffective treatments:  None tried  HPI Comments: Julie Berry is a 33 y.o. female who presents to the Emergency Department complaining of constant, cramping abdominal pain onset one week ago. She reports associated loose, dark stool. She denies any hematochezia. She reports a subjective fever, and a light headache in the back of her neck. She denies contact with sick individuals. She reports a history of cholecystectomy in 2013. LNMP began last Thursday.   Past Medical History  Diagnosis Date  . UTI (urinary tract infection)   . HTN (hypertension)   . Lump or mass in breast   . Leukocytosis   . Hyperglycemia   . Malaise and fatigue   . Morbid obesity   . Sleep apnea   . Depressed   . Low back pain   . Sciatica   . Gallstones    Past Surgical History  Procedure Laterality Date  . Cholecystectomy  06/22/2012    Procedure: LAPAROSCOPIC CHOLECYSTECTOMY;  Surgeon: Jamesetta So, MD;  Location: [facility];  Service: General;  Laterality: N/A;   Family History  Problem Relation Age of Onset  . Diabetes Mother   . Rheumatic fever Mother 26  . Migraines Father 57  . Hypertension Father   . Autism Brother    History  Substance Use Topics  . Smoking status: Never Smoker   . Smokeless tobacco: Not on file  .  Alcohol Use: No   OB History   Grav Para Term Preterm Abortions TAB SAB Ect Mult Living                 Review of Systems  Gastrointestinal: Positive for abdominal pain.  Neurological: Positive for headaches.  All other systems reviewed and are negative.  Allergies  Amoxicillin; Penicillins; Dilaudid; and Latex  Home Medications   Prior to Admission medications   Medication Sig Start Date End Date Taking? Authorizing Provider  dexamethasone (DECADRON) 6 MG tablet 1 po bid with food 02/28/14   Lenox Ahr, PA-C  diclofenac (VOLTAREN) 75 MG EC tablet Take 1 tablet (75 mg total) by mouth 2 (two) times daily. 02/28/14   Lenox Ahr, PA-C  HYDROcodone-acetaminophen (NORCO/VICODIN) 5-325 MG per tablet Take 1 tablet by mouth every 4 (four) hours as needed. 02/28/14   Lenox Ahr, PA-C   Triage Vitals: BP 117/46  Pulse 67  Temp(Src) 98 F (36.7 C) (Oral)  Resp 20  Ht 5\' 6"  (1.676 m)  Wt 283 lb (128.368 kg)  BMI 45.70 kg/m2  SpO2 100%  LMP 05/12/2014  Physical Exam  Nursing note and vitals reviewed. Constitutional: She appears well-developed and well-nourished. No distress.  HENT:  Head: Normocephalic and atraumatic.  Mouth/Throat: Oropharynx is clear and moist. No oropharyngeal exudate.  Eyes: Conjunctivae and EOM are  normal. Pupils are equal, round, and reactive to light. Right eye exhibits no discharge. Left eye exhibits no discharge. No scleral icterus.  Neck: Normal range of motion. Neck supple. No JVD present. No thyromegaly present.  Cardiovascular: Normal rate, regular rhythm, normal heart sounds and intact distal pulses.  Exam reveals no gallop and no friction rub.   No murmur heard. Pulmonary/Chest: Effort normal and breath sounds normal. No respiratory distress. She has no wheezes. She has no rales.  Abdominal: Soft. Bowel sounds are normal. She exhibits no distension and no mass. There is tenderness. There is no rebound and no guarding.  There is mild TTP in  the suprapubic region.  Musculoskeletal: Normal range of motion. She exhibits no edema and no tenderness.  Lymphadenopathy:    She has no cervical adenopathy.  Neurological: She is alert. Coordination normal.  Skin: Skin is warm and dry. No rash noted. No erythema.  Psychiatric: She has a normal mood and affect. Her behavior is normal.    ED Course  Procedures (including critical care time) DIAGNOSTIC STUDIES: Oxygen Saturation is 100% on room air, normal by my interpretation.    COORDINATION OF CARE: 12:32 AM-Discussed treatment plan which includes IV fluid and nausea medication with pt at bedside and pt agreed to plan.   Labs Review Labs Reviewed  CBC WITH DIFFERENTIAL  COMPREHENSIVE METABOLIC PANEL  URINALYSIS, ROUTINE W REFLEX MICROSCOPIC  PREGNANCY, URINE    Imaging Review No results found.   EKG Interpretation None      MDM   Final diagnoses:  None    The patient's presentation, physical exam, and workup are consistent with a viral gastroenteritis. She is feeling better with fluids and anti-medics. She will be discharged to home with when necessary followup. Her abdomen is benign and I doubt any serious intra-abdominal pathology.  I personally performed the services described in this documentation, which was scribed in my presence. The recorded information has been reviewed and is accurate.       Veryl Speak, MD 05/19/14 [id number]

## 2014-05-19 NOTE — ED Notes (Signed)
Dr. Delo at bedside. 

## 2014-05-19 NOTE — Discharge Instructions (Signed)
Phenergan as prescribed as needed for nausea.  Return to the emergency department if you develop severe abdominal pain, bloody stool, high fever, or other new and concerning symptoms.   Viral Gastroenteritis Viral gastroenteritis is also known as stomach flu. This condition affects the stomach and intestinal tract. It can cause sudden diarrhea and vomiting. The illness typically lasts 3 to 8 days. Most people develop an immune response that eventually gets rid of the virus. While this natural response develops, the virus can make you quite ill. CAUSES  Many different viruses can cause gastroenteritis, such as rotavirus or noroviruses. You can catch one of these viruses by consuming contaminated food or water. You may also catch a virus by sharing utensils or other personal items with an infected person or by touching a contaminated surface. SYMPTOMS  The most common symptoms are diarrhea and vomiting. These problems can cause a severe loss of body fluids (dehydration) and a body salt (electrolyte) imbalance. Other symptoms may include:  Fever.  Headache.  Fatigue.  Abdominal pain. DIAGNOSIS  Your caregiver can usually diagnose viral gastroenteritis based on your symptoms and a physical exam. A stool sample may also be taken to test for the presence of viruses or other infections. TREATMENT  This illness typically goes away on its own. Treatments are aimed at rehydration. The most serious cases of viral gastroenteritis involve vomiting so severely that you are not able to keep fluids down. In these cases, fluids must be given through an intravenous line (IV). HOME CARE INSTRUCTIONS   Drink enough fluids to keep your urine clear or pale yellow. Drink small amounts of fluids frequently and increase the amounts as tolerated.  Ask your caregiver for specific rehydration instructions.  Avoid:  Foods high in sugar.  Alcohol.  Carbonated drinks.  Tobacco.  Juice.  Caffeine  drinks.  Extremely hot or cold fluids.  Fatty, greasy foods.  Too much intake of anything at one time.  Dairy products until 24 to 48 hours after diarrhea stops.  You may consume probiotics. Probiotics are active cultures of beneficial bacteria. They may lessen the amount and number of diarrheal stools in adults. Probiotics can be found in yogurt with active cultures and in supplements.  Wash your hands well to avoid spreading the virus.  Only take over-the-counter or prescription medicines for pain, discomfort, or fever as directed by your caregiver. Do not give aspirin to children. Antidiarrheal medicines are not recommended.  Ask your caregiver if you should continue to take your regular prescribed and over-the-counter medicines.  Keep all follow-up appointments as directed by your caregiver. SEEK IMMEDIATE MEDICAL CARE IF:   You are unable to keep fluids down.  You do not urinate at least once every 6 to 8 hours.  You develop shortness of breath.  You notice blood in your stool or vomit. This may look like coffee grounds.  You have abdominal pain that increases or is concentrated in one small area (localized).  You have persistent vomiting or diarrhea.  You have a fever.  The patient is a child younger than 3 months, and he or she has a fever.  The patient is a child older than 3 months, and he or she has a fever and persistent symptoms.  The patient is a child older than 3 months, and he or she has a fever and symptoms suddenly get worse.  The patient is a baby, and he or she has no tears when crying. MAKE SURE YOU:   Understand  these instructions.  Will watch your condition.  Will get help right away if you are not doing well or get worse. Document Released: 08/05/2005 Document Revised: 10/28/2011 Document Reviewed: 05/22/2011 [facility] Patient Information 2015 [place], [place]. This information is not intended to replace advice given to you by your health care  provider. Make sure you discuss any questions you have with your health care provider.

## 2014-05-26 ENCOUNTER — Ambulatory Visit (INDEPENDENT_AMBULATORY_CARE_PROVIDER_SITE_OTHER): Payer: 59 | Admitting: Internal Medicine

## 2014-05-26 ENCOUNTER — Other Ambulatory Visit (INDEPENDENT_AMBULATORY_CARE_PROVIDER_SITE_OTHER): Payer: Self-pay | Admitting: *Deleted

## 2014-05-26 ENCOUNTER — Encounter (INDEPENDENT_AMBULATORY_CARE_PROVIDER_SITE_OTHER): Payer: Self-pay | Admitting: *Deleted

## 2014-05-26 ENCOUNTER — Encounter (INDEPENDENT_AMBULATORY_CARE_PROVIDER_SITE_OTHER): Payer: Self-pay | Admitting: Internal Medicine

## 2014-05-26 VITALS — BP 110/72 | HR 68 | Temp 98.0°F | Ht 65.0 in | Wt 283.3 lb

## 2014-05-26 DIAGNOSIS — R110 Nausea: Secondary | ICD-10-CM

## 2014-05-26 DIAGNOSIS — K219 Gastro-esophageal reflux disease without esophagitis: Secondary | ICD-10-CM

## 2014-05-26 MED ORDER — PANTOPRAZOLE SODIUM 40 MG PO TBEC: 40.0000 mg | DELAYED_RELEASE_TABLET | Freq: Every day | ORAL | Status: DC

## 2014-05-26 NOTE — Progress Notes (Signed)
Subjective:    Patient ID: Julie Berry, female    DOB: 27-Nov-1980, 33 y.o.   MRN: [id number]  HPI  Referred to our office by Edythe Clarity PA-C [facility]) for severe abdominal cramps, cramps, and inability to eat. No dysphagia. Recently in the ED 05/19/2014 with a viral gastroenteritis. Given fluids, labs, and discharged home. Today she presents with c/o that since she had her gallbladder removed in 2013 by Dr. Arnoldo Morale. She says her heart rate dropped and she was bagged. She says she has been since every since she had her gallbladder removed. She says she has had a dry hack cough off and on for 4-5 months.  Recently, she has epigastric pain which she describes as stabbing. All foods bother her. Sometimes she bloats and she says she looks like she is pregnant She has been on a very bland diet x 2 weeks. She says her stools are black. She has frequent constipation. Sometimes she won't have a BM for 2-3 days. Sometimes her stools are soft but is painful to pass and sometimes she is constipated. She has not seen any BRRB in her stool. Appetite is not good. She has nausea after she eats. She has a firey sensation in her stomach.  She usually has a BM every 3-4 days.  There has been no weight loss.  Weight 05/19/2014 283.  She has not tried anything for her stomach. No NSAIDs or BC powders CBC    Component Value Date/Time   WBC 10.1 05/19/2014 0031   RBC 4.46 05/19/2014 0031   HGB 12.3 05/19/2014 0031   HCT 37.6 05/19/2014 0031   PLT 274 05/19/2014 0031   MCV 84.3 05/19/2014 0031   MCH 27.6 05/19/2014 0031   MCHC 32.7 05/19/2014 0031   RDW 13.1 05/19/2014 0031   LYMPHSABS 2.9 05/19/2014 0031   MONOABS 0.6 05/19/2014 0031   EOSABS 0.3 05/19/2014 0031   BASOSABS 0.0 05/19/2014 0031    CMP     Component Value Date/Time   NA 140 05/19/2014 0031   K 3.5* 05/19/2014 0031   CL 103 05/19/2014 0031   CO2 28 05/19/2014 0031   GLUCOSE 162* 05/19/2014 0031   BUN 13 05/19/2014 0031   CREATININE 0.84 05/19/2014  0031   CALCIUM 8.8 05/19/2014 0031   PROT 7.2 05/19/2014 0031   ALBUMIN 3.3* 05/19/2014 0031   AST 13 05/19/2014 0031   ALT 15 05/19/2014 0031   ALKPHOS 60 05/19/2014 0031   BILITOT 0.2* 05/19/2014 0031   GFRNONAA 90* 05/19/2014 0031   GFRAA >90 05/19/2014 0031    Urinalysis    Component Value Date/Time   COLORURINE YELLOW 05/19/2014 0105   APPEARANCEUR CLEAR 05/19/2014 0105   LABSPEC 1.020 05/19/2014 0105   PHURINE 6.5 05/19/2014 0105   GLUCOSEU NEGATIVE 05/19/2014 0105   HGBUR NEGATIVE 05/19/2014 0105   HGBUR negative 04/13/2009 [place] 05/19/2014 0105   KETONESUR NEGATIVE 05/19/2014 0105   PROTEINUR NEGATIVE 05/19/2014 0105   UROBILINOGEN 0.2 05/19/2014 0105   NITRITE NEGATIVE 05/19/2014 0105   LEUKOCYTESUR NEGATIVE 05/19/2014 0105   05/19/2014 Urine pregnancy negative. LMP one week and was normal.     Review of Systems Past Medical History  Diagnosis Date  . UTI (urinary tract infection)   . HTN (hypertension)   . Lump or mass in breast   . Leukocytosis   . Hyperglycemia   . Malaise and fatigue   . Morbid obesity   . Sleep apnea   .  Depressed   . Low back pain   . Sciatica   . Gallstones     Past Surgical History  Procedure Laterality Date  . Cholecystectomy  06/22/2012    Procedure: LAPAROSCOPIC CHOLECYSTECTOMY;  Surgeon: Jamesetta So, MD;  Location: [facility];  Service: General;  Laterality: N/A;    Allergies  Allergen Reactions  . Amoxicillin Anaphylaxis  . Penicillins Anaphylaxis  . Dilaudid [Hydromorphone] Nausea And Vomiting and Other (See Comments)    States felt burning all over, "felt like i was coming out of the bed"  . Latex Rash    No current outpatient prescriptions on file prior to visit.   No current facility-administered medications on file prior to visit.        Objective:   Physical Exam  Filed Vitals:   05/26/14 1404  BP: 110/72  Pulse: 68  Temp: 98 F (36.7 C)  Height: 5\' 5"  (1.651 m)  Weight: 283 lb 4.8 oz (128.504  kg)  Alert and oriented. Skin warm and dry. Oral mucosa is moist.   . Sclera anicteric, conjunctivae is pink. Thyroid not enlarged. No cervical lymphadenopathy. Lungs clear. Heart regular rate and rhythm.  Abdomen is soft. Bowel sounds are positive. No hepatomegaly. No abdominal masses felt. Epigastric tenderness.  No edema to lower extremities.  Stool brown and guaiac negative.       Assessment & Plan:  GERD. PUD needs to be ruled out. EGD Protonix 40mg  po

## 2014-05-26 NOTE — Patient Instructions (Signed)
EGD. Rx for Protonix 40mg  po 30 minutes before breakfast.

## 2014-05-27 DIAGNOSIS — K219 Gastro-esophageal reflux disease without esophagitis: Secondary | ICD-10-CM | POA: Insufficient documentation

## 2014-06-01 ENCOUNTER — Encounter (HOSPITAL_COMMUNITY): Payer: Self-pay | Admitting: Pharmacy Technician

## 2014-06-09 ENCOUNTER — Encounter (HOSPITAL_COMMUNITY)
Admission: RE | Disposition: A | Discharge status: Home / Self Care | Payer: Self-pay | Source: Ambulatory Visit | Attending: Internal Medicine

## 2014-06-09 ENCOUNTER — Ambulatory Visit (HOSPITAL_COMMUNITY)
Admission: RE | Admit: 2014-06-09 | Discharge: 2014-06-09 | Disposition: A | Discharge status: Home / Self Care | Payer: 59 | Source: Ambulatory Visit | Attending: Internal Medicine | Admitting: Internal Medicine

## 2014-06-09 ENCOUNTER — Encounter (HOSPITAL_COMMUNITY): Payer: Self-pay

## 2014-06-09 DIAGNOSIS — Z880 Allergy status to penicillin: Secondary | ICD-10-CM | POA: Insufficient documentation

## 2014-06-09 DIAGNOSIS — K219 Gastro-esophageal reflux disease without esophagitis: Secondary | ICD-10-CM

## 2014-06-09 DIAGNOSIS — K319 Disease of stomach and duodenum, unspecified: Secondary | ICD-10-CM | POA: Diagnosis not present

## 2014-06-09 DIAGNOSIS — I10 Essential (primary) hypertension: Secondary | ICD-10-CM | POA: Diagnosis not present

## 2014-06-09 DIAGNOSIS — K449 Diaphragmatic hernia without obstruction or gangrene: Secondary | ICD-10-CM | POA: Diagnosis not present

## 2014-06-09 DIAGNOSIS — K2960 Other gastritis without bleeding: Secondary | ICD-10-CM

## 2014-06-09 DIAGNOSIS — Z91040 Latex allergy status: Secondary | ICD-10-CM | POA: Insufficient documentation

## 2014-06-09 DIAGNOSIS — R110 Nausea: Secondary | ICD-10-CM

## 2014-06-09 DIAGNOSIS — F329 Major depressive disorder, single episode, unspecified: Secondary | ICD-10-CM | POA: Diagnosis not present

## 2014-06-09 DIAGNOSIS — K210 Gastro-esophageal reflux disease with esophagitis: Secondary | ICD-10-CM | POA: Insufficient documentation

## 2014-06-09 DIAGNOSIS — Z881 Allergy status to other antibiotic agents status: Secondary | ICD-10-CM | POA: Insufficient documentation

## 2014-06-09 DIAGNOSIS — K222 Esophageal obstruction: Secondary | ICD-10-CM

## 2014-06-09 DIAGNOSIS — R12 Heartburn: Secondary | ICD-10-CM

## 2014-06-09 DIAGNOSIS — R1013 Epigastric pain: Secondary | ICD-10-CM

## 2014-06-09 DIAGNOSIS — R112 Nausea with vomiting, unspecified: Secondary | ICD-10-CM

## 2014-06-09 DIAGNOSIS — Z885 Allergy status to narcotic agent status: Secondary | ICD-10-CM | POA: Diagnosis not present

## 2014-06-09 DIAGNOSIS — G4730 Sleep apnea, unspecified: Secondary | ICD-10-CM | POA: Diagnosis not present

## 2014-06-09 HISTORY — DX: Adverse effect of unspecified anesthetic, initial encounter: T41.45XA

## 2014-06-09 HISTORY — DX: Other complications of anesthesia, initial encounter: T88.59XA

## 2014-06-09 HISTORY — PX: ESOPHAGOGASTRODUODENOSCOPY: SHX5428

## 2014-06-09 SURGERY — EGD (ESOPHAGOGASTRODUODENOSCOPY)
Anesthesia: Moderate Sedation

## 2014-06-09 MED ORDER — MIDAZOLAM HCL 5 MG/5ML IJ SOLN
INTRAMUSCULAR | Status: DC | PRN
  Administered 2014-06-09: 2 mg via INTRAVENOUS
  Administered 2014-06-09 (×2): 3 mg via INTRAVENOUS
  Administered 2014-06-09: 2 mg via INTRAVENOUS

## 2014-06-09 MED ORDER — BUTAMBEN-TETRACAINE-BENZOCAINE 2-2-14 % EX AERO
INHALATION_SPRAY | CUTANEOUS | Status: DC | PRN
  Administered 2014-06-09: 2 via TOPICAL

## 2014-06-09 MED ORDER — DICYCLOMINE HCL 10 MG PO CAPS: 10.0000 mg | ORAL_CAPSULE | Freq: Three times a day (TID) | ORAL | Status: DC

## 2014-06-09 MED ORDER — MEPERIDINE HCL 50 MG/ML IJ SOLN
INTRAMUSCULAR | Status: DC | PRN
  Administered 2014-06-09 (×2): 25 mg via INTRAVENOUS

## 2014-06-09 MED ORDER — SODIUM CHLORIDE 0.9 % IV SOLN
INTRAVENOUS | Status: DC
  Administered 2014-06-09: 13:00:00 via INTRAVENOUS

## 2014-06-09 MED ORDER — MEPERIDINE HCL 50 MG/ML IJ SOLN
INTRAMUSCULAR | Status: AC
  Filled 2014-06-09: qty 1

## 2014-06-09 MED ORDER — MIDAZOLAM HCL 5 MG/5ML IJ SOLN
INTRAMUSCULAR | Status: AC
  Filled 2014-06-09: qty 10

## 2014-06-09 MED ORDER — STERILE WATER FOR IRRIGATION IR SOLN
Status: DC | PRN
  Administered 2014-06-09: 14:00:00

## 2014-06-09 NOTE — Op Note (Signed)
EGD PROCEDURE REPORT  PATIENT:  Julie Berry  MR#:  [id number] Birthdate:  Feb 28, 1981, 33 y.o., female Endoscopist:  Dr. Rogene Houston, MD Referred By:  Mr. Collene Mares, [facility] Procedure Date: 06/09/2014  Procedure:   EGD  Indications:  Patient is 33 year old African female who presents with several month history of postprandial diarrhea and abdominal cramps. These symptoms started after she had cholecystectomy in February 2013. For the last 2-3 months she's been experiencing daily heartburn nausea vomiting and epigastric pain. She was given prescription for pantoprazole during recent office visit she still has not been able to fill her prescription(prior authorization needed). Patient had normal CBC and comprehensive chemistry panel 3 weeks ago except glucose of 162 and albumin of 3.3.            Informed Consent:  The risks, benefits, alternatives & imponderables which include, but are not limited to, bleeding, infection, perforation, drug reaction and potential missed lesion have been reviewed.  The potential for biopsy, lesion removal, esophageal dilation, etc. have also been discussed.  Questions have been answered.  All parties agreeable.  Please see history & physical in medical record for more information.  Medications:  Demerol 50 mg IV Versed 10 mg IV Cetacaine spray topically for oropharyngeal anesthesia  Description of procedure:  The endoscope was introduced through the mouth and advanced to the second portion of the duodenum without difficulty or limitations. The mucosal surfaces were surveyed very carefully during advancement of the scope and upon withdrawal.  Findings:  Esophagus:  Mucosa of the proximal and middle third was normal and there was hyperemia involving distal 2 cm of esophageal mucosa. Lumen was dilated but no stricture or narrowing noted at GE junction. GEJ:  35 cm Hiatus:  37 cm Stomach:  Stomach was empty and distended very well with insufflation. Folds  in the proximal stomach were normal. Examination of mucosa revealed linear streaks of erythema and edema at gastric body and antrum to but no erosions or ulcers noted. Pyloric channel was patent. Angularis fundus and cardia were unremarkable.  Duodenum:  Normal bulbar and post bulbar mucosa.  Therapeutic/Diagnostic Maneuvers Performed:   Biopsy taken for mucosa gastric body and antrum for routine histology.  Complications:  None  Impression: Mildly dilated body of esophagus without distal narrowing or stricture or changes to suggest achalasia. Mild changes of reflux esophagitis at distal esophagus. Small sliding hiatal hernia. Nonerosive gastritis. Biopsy taken.   Comment; Somewhat dilated esophageal body may indicate esophageal motility disorder since she does not have distal stricture or tight LES.   Recommendations: Dicyclomine 10 mg by mouth a.c. Dexilant 60 mg by mouth every morning until pantoprazole prescription improved. Will obtain esophagogram.  I will be contacting patient with biopsy results.  [name] U  06/09/2014  2:26 PM  CC: Dr. Glo Herring., MD & Dr. Rayne Du ref. provider found

## 2014-06-09 NOTE — Discharge Instructions (Signed)
Resume pantoprazole as before. Dicyclomine 10 mg by mouth 30 minutes before each meal daily. No driving for 24 hours.Physician will call with biopsy results.                                                                                                                                                          Esophagogastroduodenoscopy Care After Refer to this sheet in the next few weeks. These instructions provide you with information on caring for yourself after your procedure. Your caregiver may also give you more specific instructions. Your treatment has been planned according to current medical practices, but problems sometimes occur. Call your caregiver if you have any problems or questions after your procedure.  HOME CARE INSTRUCTIONS  Do not eat or drink anything until the numbing medicine (local anesthetic) has worn off and your gag reflex has returned. You will know that the local anesthetic has worn off when you can swallow comfortably.  Do not drive for 12 hours after the procedure or as directed by your caregiver.  Only take medicines as directed by your caregiver. SEEK MEDICAL CARE IF:   You cannot stop coughing.  You are not urinating at all or less than usual. SEEK IMMEDIATE MEDICAL CARE IF:  You have difficulty swallowing.  You cannot eat or drink.  You have worsening throat or chest pain.  You have dizziness, lightheadedness, or you faint.  You have nausea or vomiting.  You have chills.  You have a fever.  You have severe abdominal pain.  You have black, tarry, or bloody stools. Document Released: 07/22/2012 Document Reviewed: 07/22/2012 [facility] Patient Information 2015 [place]. This information is not intended to replace advice given to you by your health care provider. Make sure you discuss any questions you have with your health care provider.

## 2014-06-09 NOTE — H&P (Signed)
Julie Berry is an 33 y.o. female.   Chief Complaint: Patient is here for EGD. HPI: Patient is 33 year old African female who presents with a two-month history of epigastric pain nausea and vomiting. She had cholecystectomy in February 2013 and ever since has had cramping and diarrhea. She denies hematemesis melena or rectal bleeding. She also complains of daily heartburn. Despite her symptoms her weight has been stable. She does not take OTC NSAIDs. She was seen in the office about 3 weeks ago and prescription given for pantoprazole but she has not been able to fill her prescription and received 25 day supply. He had normal CBC LFTs and serum calcium on 05/19/2014.  Past Medical History  Diagnosis Date  . UTI (urinary tract infection)   . HTN (hypertension)   . Lump or mass in breast   . Leukocytosis   . Hyperglycemia   . Malaise and fatigue   . Morbid obesity   . Sleep apnea   . Depressed   . Low back pain   . Sciatica   . Gallstones   . Complication of anesthesia     heartrate dropped with anesthesia    Past Surgical History  Procedure Laterality Date  . Cholecystectomy  06/22/2012    Procedure: LAPAROSCOPIC CHOLECYSTECTOMY;  Surgeon: Jamesetta So, MD;  Location: [facility];  Service: General;  Laterality: N/A;    Family History  Problem Relation Age of Onset  . Diabetes Mother   . Rheumatic fever Mother 62  . Migraines Father 30  . Hypertension Father   . Autism Brother    Social History:  reports that she has never smoked. She does not have any smokeless tobacco history on file. She reports that she does not drink alcohol or use illicit drugs.  Allergies:  Allergies  Allergen Reactions  . Amoxicillin Anaphylaxis  . Penicillins Anaphylaxis  . Dilaudid [Hydromorphone] Nausea And Vomiting and Other (See Comments)    States felt burning all over, "felt like i was coming out of the bed"  . Latex Rash    Medications Prior to Admission  Medication Sig Dispense Refill   . pantoprazole (PROTONIX) 40 MG tablet Take 1 tablet (40 mg total) by mouth daily. 30 minutes before breakfast.  90 tablet  3    No results found for this or any previous visit (from the past 48 hour(s)). No results found.  ROS  Blood pressure 110/68, pulse 88, resp. rate 16, last menstrual period 05/12/2014, SpO2 97.00%. Physical Exam  Constitutional: She appears well-developed and well-nourished.  HENT:  Mouth/Throat: Oropharynx is clear and moist.  Eyes: Conjunctivae are normal. No scleral icterus.  Neck: No thyromegaly present.  Cardiovascular: Normal rate, regular rhythm and normal heart sounds.   No murmur heard. Respiratory: Effort normal and breath sounds normal.  GI: Soft. She exhibits no distension and no mass. Rebound: mild tenderness in midepigastrium and hypogastric region.  Musculoskeletal: She exhibits no edema.  Lymphadenopathy:    She has no cervical adenopathy.  Neurological: She is alert.  Skin: Skin is warm and dry.     Assessment/Plan Epigastric pain nausea vomiting and heartburn. She also has postprandial diarrhea either IBS or postcholecystectomy syndrome. Diagnostic EGD.  Tivon Lemoine U 06/09/2014, 1:43 PM

## 2014-06-10 ENCOUNTER — Other Ambulatory Visit (INDEPENDENT_AMBULATORY_CARE_PROVIDER_SITE_OTHER): Payer: Self-pay | Admitting: Internal Medicine

## 2014-06-10 ENCOUNTER — Encounter (INDEPENDENT_AMBULATORY_CARE_PROVIDER_SITE_OTHER): Payer: Self-pay | Admitting: *Deleted

## 2014-06-10 ENCOUNTER — Telehealth (INDEPENDENT_AMBULATORY_CARE_PROVIDER_SITE_OTHER): Payer: Self-pay | Admitting: *Deleted

## 2014-06-10 DIAGNOSIS — R112 Nausea with vomiting, unspecified: Secondary | ICD-10-CM

## 2014-06-10 DIAGNOSIS — R1013 Epigastric pain: Secondary | ICD-10-CM

## 2014-06-10 DIAGNOSIS — R12 Heartburn: Secondary | ICD-10-CM

## 2014-06-10 NOTE — Telephone Encounter (Signed)
Noted that patient was begun on metformin

## 2014-06-10 NOTE — Telephone Encounter (Signed)
FYI: PCP started her on Metformin today.

## 2014-06-10 NOTE — Telephone Encounter (Signed)
Dr.Rehman the patient ask that you be made aware of this.

## 2014-06-14 ENCOUNTER — Encounter (HOSPITAL_COMMUNITY): Payer: Self-pay | Admitting: Internal Medicine

## 2014-06-15 ENCOUNTER — Ambulatory Visit (HOSPITAL_COMMUNITY)
Admission: RE | Admit: 2014-06-15 | Discharge: 2014-06-15 | Disposition: A | Discharge status: Home / Self Care | Payer: 59 | Source: Ambulatory Visit | Attending: Internal Medicine | Admitting: Internal Medicine

## 2014-06-15 DIAGNOSIS — R1013 Epigastric pain: Secondary | ICD-10-CM | POA: Diagnosis present

## 2014-06-15 DIAGNOSIS — R112 Nausea with vomiting, unspecified: Secondary | ICD-10-CM

## 2014-06-15 DIAGNOSIS — R1310 Dysphagia, unspecified: Secondary | ICD-10-CM | POA: Insufficient documentation

## 2014-06-15 DIAGNOSIS — R12 Heartburn: Secondary | ICD-10-CM | POA: Insufficient documentation

## 2014-06-16 ENCOUNTER — Encounter (INDEPENDENT_AMBULATORY_CARE_PROVIDER_SITE_OTHER): Payer: Self-pay | Admitting: *Deleted

## 2014-06-21 ENCOUNTER — Ambulatory Visit: Payer: 59 | Admitting: Gastroenterology

## 2014-06-24 ENCOUNTER — Encounter: Payer: Self-pay | Admitting: Nutrition

## 2014-06-24 ENCOUNTER — Encounter: Payer: 59 | Attending: Family Medicine | Admitting: Nutrition

## 2014-06-24 ENCOUNTER — Telehealth (INDEPENDENT_AMBULATORY_CARE_PROVIDER_SITE_OTHER): Payer: Self-pay | Admitting: *Deleted

## 2014-06-24 VITALS — Ht 66.0 in | Wt 284.5 lb

## 2014-06-24 DIAGNOSIS — K2100 Gastro-esophageal reflux disease with esophagitis, without bleeding: Secondary | ICD-10-CM

## 2014-06-24 DIAGNOSIS — E1165 Type 2 diabetes mellitus with hyperglycemia: Secondary | ICD-10-CM

## 2014-06-24 DIAGNOSIS — E118 Type 2 diabetes mellitus with unspecified complications: Secondary | ICD-10-CM | POA: Insufficient documentation

## 2014-06-24 DIAGNOSIS — Z6841 Body Mass Index (BMI) 40.0 and over, adult: Secondary | ICD-10-CM | POA: Insufficient documentation

## 2014-06-24 DIAGNOSIS — Z713 Dietary counseling and surveillance: Secondary | ICD-10-CM | POA: Diagnosis not present

## 2014-06-24 DIAGNOSIS — IMO0002 Reserved for concepts with insufficient information to code with codable children: Secondary | ICD-10-CM

## 2014-06-24 NOTE — Telephone Encounter (Signed)
Patient was left a message asking that she call our office to help answer some on the questions from her PA papers. Patient called and answers the questions. Information given to Terri to complete/sign.  This may wait until Lelon Frohlich returns to release any supporting information for the need of this medication. Patient was advised that our office would let her know when we rec'v a response from [employer]. This make take 24-72 hours for a review.

## 2014-06-24 NOTE — Progress Notes (Signed)
  Medical Nutrition Therapy:  Appt start time: 0800 end time:  0900.   Assessment:  Primary concerns today: Diabetes and obesity.  Lives with her husband. Works full time. States her job is very stressful. Just found out she has diabetes type 2. Recently has started walking in the morning for 30 minutes. Eats 1-2 meals. Was on Metformin. A1C was reported to be 7.6%. Family history of diabetes. Doesn't eat fast food. Cooks a lot at home. Hasn't been exercising. Gained 15 lbs over the last year. Current weight is the most she has ever weighed.  Use to drink a lot of juice,  and sodas and now has been drinking water. Currently has a meter-Once touch ultra.Marland Kitchen FBS 160's. Recently had a EGD with FBS of 251 mg/dl.  Preferred Learning Style:   Auditory  Visual  Hands on   Learning Readiness:     Ready  Change in progress   MEDICATIONS: See list   DIETARY INTAKE:  24-hr recall:  B ( AM): breakfast bar or 1 boiled egg, 2-3 slices bacon, hash brown and orange juice 6-8 oz Snk ( AM): none L ( PM): Tuna salad on wheat bread, or 6 in sub at [employer]. Chips 2 oz, Water. Snk ( PM):none D ( PM): Chicken tenderloins (3) stewed potatoes 1/2 c, water 16 pz Snk ( PM): sometimes might have halo orange Beverages: water recently  Usual physical activity: walking 30 minutes a day 4 days a week.  Estimated energy needs: 1500 calories 170 g carbohydrates 112 g protein 42 g fat  Progress Towards Goal(s):  In progress.   Nutritional Diagnosis:  NB-1.1 Food and nutrition-related knowledge deficit As related to Diabetes.  As evidenced by A1C >7%..    Intervention:  Nutrition counseling and diabetes education.. Plan:  Aim for 2-3 Carb Choices per meal (30-45 grams) +/- 1 either way  Avoid snacks Include protein in moderation with your meals and snacks Consider reading food labels for Total Carbohydrate and Fat Grams of foods Consider  increasing your activity level by 30 for 60 minutes daily as  tolerated Consider checking BG at alternate times per day as directed by MD  Continue taking Metformin and Glipizide daily as directed. Take Metformin with breakkast and supper.  Teaching Method Utilized:  Visual Auditory Hands on  Handouts given during visit include: Living Well with Diabetes Carb Counting and Food Label handouts Meal Plan Card The Plate Method   Barriers to learning/adherence to lifestyle change: none  Demonstrated degree of understanding via:  Teach Back   Monitoring/Evaluation:  Dietary intake, exercise, meal planning, SBG, and body weight 1 month.Marland Kitchen

## 2014-06-24 NOTE — Patient Instructions (Signed)
Intervention:  Nutrition counseling and diabetes education.. Plan:  Aim for 2-3 Carb Choices per meal (30-45 grams) +/- 1 either way  Avoid snacks Include protein in moderation with your meals and snacks Consider reading food labels for Total Carbohydrate and Fat Grams of foods Consider  increasing your activity level by 30 for 60 minutes daily as tolerated Consider checking BG at alternate times per day as directed by MD  Continue taking Metformin and Glipizide daily as directed. Take Metformin with breakkast and supper.

## 2014-07-18 ENCOUNTER — Encounter (INDEPENDENT_AMBULATORY_CARE_PROVIDER_SITE_OTHER): Payer: Self-pay | Admitting: Internal Medicine

## 2014-07-18 ENCOUNTER — Ambulatory Visit (INDEPENDENT_AMBULATORY_CARE_PROVIDER_SITE_OTHER): Payer: 59 | Admitting: Internal Medicine

## 2014-07-18 VITALS — BP 100/62 | HR 72 | Temp 97.7°F | Ht 65.0 in | Wt 283.8 lb

## 2014-07-18 DIAGNOSIS — R110 Nausea: Secondary | ICD-10-CM

## 2014-07-18 DIAGNOSIS — E669 Obesity, unspecified: Secondary | ICD-10-CM

## 2014-07-18 DIAGNOSIS — E1169 Type 2 diabetes mellitus with other specified complication: Secondary | ICD-10-CM

## 2014-07-18 DIAGNOSIS — E119 Type 2 diabetes mellitus without complications: Secondary | ICD-10-CM

## 2014-07-18 NOTE — Progress Notes (Signed)
Subjective:    Patient ID: Julie Berry, female    DOB: 03/22/1981, 33 y.o.   MRN: [id number]  HPI  Here today for f/u. She tells me she has nausea.  She really has not lost any weight.  Appetite is not good. Recently diagnosed with diabetes and started on Metformin.  Usually has a BM x 3 a day. No melena or BRRB She avoiding fried foods. She does eat some fried food. She says she is sluggish with no energy. She goes back to work Architectural technologist.       06/09/2014 EGD:  Indications:  Patient is 33 year old African female who presents with several month history of postprandial diarrhea and abdominal cramps. These symptoms started after she had cholecystectomy in February 2013. For the last 2-3 months she's been experiencing daily heartburn nausea vomiting and epigastric pain. She was given prescription for pantoprazole during recent office visit she still has not been able to fill her prescription(prior authorization needed). Patient had normal CBC and comprehensive chemistry panel 3 weeks ago except glucose of 162 and albumin of 3.3.                      Notes Recorded by Rogene Houston, MD on 06/15/2014 at 5:17 PM Gastric biopsy shows reactive gastropathy but no evidence of H. pylori infection or other abnormalities.                                                                                               Recommendations: Dicyclomine 10 mg by mouth a.c. Dexilant 60 mg by mouth every morning until pantoprazole prescription improved. Will obtain esophagram  I will be contacting patient with biopsy results.  06/15/2014 Esophagram: IMPRESSION: Review of Systems Past Medical History  Diagnosis Date  . UTI (urinary tract infection)   . HTN (hypertension)   . Lump or mass in breast   . Leukocytosis   . Hyperglycemia   . Malaise and fatigue   . Morbid obesity   . Sleep apnea   . Depressed   . Low back pain   . Sciatica   . Gallstones   . Complication of anesthesia    heartrate dropped with anesthesia  . Diabetes mellitus without complication     Past Surgical History  Procedure Laterality Date  . Cholecystectomy  06/22/2012    Procedure: LAPAROSCOPIC CHOLECYSTECTOMY;  Surgeon: Jamesetta So, MD;  Location: [facility];  Service: General;  Laterality: N/A;  . Esophagogastroduodenoscopy N/A 06/09/2014    Procedure: ESOPHAGOGASTRODUODENOSCOPY (EGD);  Surgeon: Rogene Houston, MD;  Location: [facility];  Service: Endoscopy;  Laterality: N/A;  200-moved to 145 Ann to notify pt    Allergies  Allergen Reactions  . Amoxicillin Anaphylaxis  . Penicillins Anaphylaxis  . Dilaudid [Hydromorphone] Nausea And Vomiting and Other (See Comments)    States felt burning all over, "felt like i was coming out of the bed"  . Latex Rash    Current Outpatient Prescriptions on File Prior to Visit  Medication Sig Dispense Refill  . metFORMIN (GLUCOPHAGE) 500 MG tablet Take 500 mg by  mouth 2 (two) times daily with a meal.     . pantoprazole (PROTONIX) 40 MG tablet Take 1 tablet (40 mg total) by mouth daily. 30 minutes before breakfast. 90 tablet 3  . dicyclomine (BENTYL) 10 MG capsule Take 1 capsule (10 mg total) by mouth 3 (three) times daily before meals. (Patient not taking: Reported on 07/18/2014) 90 capsule 5   No current facility-administered medications on file prior to visit.        Objective:   Physical Exam  Filed Vitals:   07/18/14 1134  Height: 5\' 5"  (1.651 m)  Weight: 283 lb 12.8 oz (128.731 kg)   Alert and oriented. Skin warm and dry. Oral mucosa is moist.   . Sclera anicteric, conjunctivae is pink. Thyroid not enlarged. No cervical lymphadenopathy. Lungs clear. Heart regular rate and rhythm.  Abdomen is soft. Bowel sounds are positive. No hepatomegaly. No abdominal masses felt. No tenderness.  No edema to lower extremities.         Assessment & Plan:  Nausea. Recent EGD showed  Reactive gastropathy  Start the Dicyclomine. Needs to diet and  exercise. OV 6 months.

## 2014-07-18 NOTE — Patient Instructions (Signed)
Start the Dicyclomine daily. Eat small meals. Lose weight. Exercise. OV 6 months

## 2014-07-25 ENCOUNTER — Ambulatory Visit: Payer: 59 | Admitting: Nutrition

## 2014-07-29 ENCOUNTER — Encounter (INDEPENDENT_AMBULATORY_CARE_PROVIDER_SITE_OTHER): Payer: Self-pay | Admitting: *Deleted

## 2014-08-04 ENCOUNTER — Encounter: Payer: 59 | Attending: Family Medicine | Admitting: Nutrition

## 2014-08-04 DIAGNOSIS — E118 Type 2 diabetes mellitus with unspecified complications: Secondary | ICD-10-CM | POA: Insufficient documentation

## 2014-08-04 DIAGNOSIS — K210 Gastro-esophageal reflux disease with esophagitis: Secondary | ICD-10-CM | POA: Insufficient documentation

## 2014-08-04 DIAGNOSIS — Z6841 Body Mass Index (BMI) 40.0 and over, adult: Secondary | ICD-10-CM | POA: Insufficient documentation

## 2014-08-04 DIAGNOSIS — Z713 Dietary counseling and surveillance: Secondary | ICD-10-CM | POA: Insufficient documentation

## 2015-02-07 ENCOUNTER — Ambulatory Visit (INDEPENDENT_AMBULATORY_CARE_PROVIDER_SITE_OTHER): Payer: 59 | Admitting: Internal Medicine

## 2015-02-22 ENCOUNTER — Ambulatory Visit (INDEPENDENT_AMBULATORY_CARE_PROVIDER_SITE_OTHER): Payer: Self-pay | Admitting: Internal Medicine

## 2015-03-15 ENCOUNTER — Telehealth: Payer: Self-pay | Admitting: Endocrinology

## 2015-03-15 NOTE — Telephone Encounter (Signed)
She needs to be managed by her primary care physician.  May be able to see the patient in a 30 minute slot with any physician here

## 2015-03-15 NOTE — Telephone Encounter (Signed)
Please see below.

## 2015-03-15 NOTE — Telephone Encounter (Signed)
[facility] note dated: Caller states has appt for August 10; would like to be seen sooner; med MD changed from metformin to glucophage; having some side effects; morning sugar is low, w/dizziness and nausea; happening every day; has not yet been seen; MD dropped her back to once per day; when BS is low 100s she is having the nausea and lightheadedness;  Pt has never been seen by [place] appt on 8/10 is a NP appt

## 2015-03-15 NOTE — Telephone Encounter (Signed)
Unable to reach patient by phone 

## 2015-03-29 ENCOUNTER — Ambulatory Visit (INDEPENDENT_AMBULATORY_CARE_PROVIDER_SITE_OTHER): Payer: BLUE CROSS/BLUE SHIELD | Admitting: Endocrinology

## 2015-03-29 ENCOUNTER — Encounter: Payer: Self-pay | Admitting: Endocrinology

## 2015-03-29 VITALS — BP 108/70 | HR 92 | Temp 98.0°F | Resp 16 | Ht 65.0 in | Wt 278.2 lb

## 2015-03-29 DIAGNOSIS — E7800 Pure hypercholesterolemia, unspecified: Secondary | ICD-10-CM

## 2015-03-29 DIAGNOSIS — IMO0002 Reserved for concepts with insufficient information to code with codable children: Secondary | ICD-10-CM

## 2015-03-29 DIAGNOSIS — E1165 Type 2 diabetes mellitus with hyperglycemia: Secondary | ICD-10-CM

## 2015-03-29 LAB — BASIC METABOLIC PANEL
BUN: 14 mg/dL (ref 6–23)
CALCIUM: 9.3 mg/dL (ref 8.4–10.5)
CO2: 30 meq/L (ref 19–32)
Chloride: 101 mEq/L (ref 96–112)
Creatinine, Ser: 0.83 mg/dL (ref 0.40–1.20)
GFR: 100.27 mL/min (ref >60.00)
GLUCOSE: 196 mg/dL — AB (ref 70–99)
Potassium: 3.9 mEq/L (ref 3.5–5.1)
Sodium: 138 mEq/L (ref 135–145)

## 2015-03-29 LAB — HEMOGLOBIN A1C: Hgb A1c MFr Bld: 10.6 % — ABNORMAL HIGH (ref 4.6–6.5)

## 2015-03-29 MED ORDER — EMPAGLIFLOZIN 10 MG PO TABS: 10.0000 mg | ORAL_TABLET | Freq: Every day | ORAL | Status: DC

## 2015-03-29 MED ORDER — VICTOZA 18 MG/3ML ~~LOC~~ SOPN: 1.2000 mg | PEN_INJECTOR | Freq: Every day | SUBCUTANEOUS | Status: DC

## 2015-03-29 NOTE — Progress Notes (Signed)
Patient ID: Julie Berry, female   DOB: 09-19-1980, 34 y.o.   MRN: [id number]           Reason for Appointment: Consultation for Type 2 Diabetes  Referring physician: Gerarda Fraction  History of Present Illness:          Date of diagnosis of type 2 diabetes mellitus:   11/2013       Background history:  She thinks she had blood sugars of about 300 when she was first diagnosed; although she was complaining of some fatigue she was diagnosed with routine labs on her annual visit.  Her records indicate she had an A1c of 6.3 in 12/2011 but did not have any further A1c and her physician record  She was started on metformin twice a day but she could not tolerate it well because of diarrhea.  She thinks she tried to take it for about 6 months with some improvement in her blood sugars. Earlier this year she had not been taking metformin at all and her blood sugars apparently started increasing significantly In 6/16 her blood sugar was about 590 and glipizide 10 mg twice a day was started  Recent history:       She has had persistently high blood sugars even with starting glipizide although they may be slightly better She is not symptomatic with increased thirst and frequent urination although she does still feel significantly fatigued and has some thirst She has checked her blood sugars fairly regularly on her monitor and brought this for download today  Current blood sugar patterns and problems identified:  Her monitor at that all date and time programmed on it  Fasting blood sugars are near or above 200 usually  Blood sugars are also consistently high later in the day with occasional improved readings before supper.  Blood sugars have been as high as 324 in the last few days  She has only recently started exercising     Oral hypoglycemic drugs the patient is taking are:   glipizide 10 mg twice a day     Side effects from medications have been: Metfomin causes diarrhea  Compliance with the  medical regimen: Fairly good  Hypoglycemia: Never    Glucose monitoring:  done  times a day         Glucometer: One Touch.      Blood Glucose readings by time of day and averages from meter download:  PREMEAL Breakfast Lunch Dinner Bedtime  Overall   Glucose range:  186-252   160-349   172-366   223-324    Median:  224      253     Self-care: The diet that the patient has been following is: tries to limit sodas, fast food, fried foods .     Typical meal intake: Breakfast is mostly frosted flakes cereal.  Usually eating grilled chicken at lunch and dinner                Dietician visit, most recent: 6/15               Exercise:Restarting a few days ago, up to 4 days a week with walking 30 minutes    Weight history:  Wt Readings from Last 3 Encounters:  03/29/15 278 lb 3.2 oz (126.191 kg)  07/18/14 283 lb 12.8 oz (128.731 kg)  06/24/14 284 lb 8 oz (129.048 kg)    Glycemic control:   Lab Results  Component Value Date   HGBA1C 6.2 05/16/2009  Lab Results  Component Value Date   LDLCALC 131 05/16/2009   CREATININE 0.84 05/19/2014         Medication List       This list is accurate as of: 03/29/15  4:07 PM.  Always use your most recent med list.               dicyclomine 10 MG capsule  Commonly known as:  BENTYL  Take 1 capsule (10 mg total) by mouth 3 (three) times daily before meals.     empagliflozin 10 MG Tabs tablet  Commonly known as:  JARDIANCE  Take 10 mg by mouth daily.     glipiZIDE 10 MG tablet  Commonly known as:  GLUCOTROL  Take 10 mg by mouth daily with supper.     metFORMIN 500 MG tablet  Commonly known as:  GLUCOPHAGE  Take 500 mg by mouth 2 (two) times daily with a meal.     ONE TOUCH ULTRA TEST test strip  Generic drug:  glucose blood     ONETOUCH DELICA LANCETS 45X Misc     VICTOZA 18 MG/3ML Sopn  Generic drug:  Liraglutide  Inject 0.2 mLs (1.2 mg total) into the skin daily. Inject once daily at the same time        Allergies:   Allergies  Allergen Reactions  . Amoxicillin Anaphylaxis  . Penicillins Anaphylaxis  . Dilaudid [Hydromorphone] Nausea And Vomiting and Other (See Comments)    States felt burning all over, "felt like i was coming out of the bed"  . Latex Rash    Past Medical History  Diagnosis Date  . UTI (urinary tract infection)   . HTN (hypertension)   . Lump or mass in breast   . Leukocytosis   . Hyperglycemia   . Malaise and fatigue   . Morbid obesity   . Sleep apnea   . Depressed   . Low back pain   . Sciatica   . Gallstones   . Complication of anesthesia     heartrate dropped with anesthesia  . Diabetes mellitus without complication     Past Surgical History  Procedure Laterality Date  . Cholecystectomy  06/22/2012    Procedure: LAPAROSCOPIC CHOLECYSTECTOMY;  Surgeon: Jamesetta So, MD;  Location: [facility];  Service: General;  Laterality: N/A;  . Esophagogastroduodenoscopy N/A 06/09/2014    Procedure: ESOPHAGOGASTRODUODENOSCOPY (EGD);  Surgeon: Rogene Houston, MD;  Location: [facility];  Service: Endoscopy;  Laterality: N/A;  200-moved to 145 Ann to notify pt    Family History  Problem Relation Age of Onset  . Diabetes Mother   . Rheumatic fever Mother 50  . Migraines Father 31  . Hypertension Father   . Autism Brother     Social History:  reports that she has never smoked. She does not have any smokeless tobacco history on file. She reports that she does not drink alcohol or use illicit drugs.    Review of Systems    Lipid history:  has not been on any medications.  Lipids in 10/15 showed HDL 39, LDL 122    Lab Results  Component Value Date   CHOL 186 05/16/2009   HDL 37 05/16/2009   LDLCALC 131 05/16/2009   TRIG 89 05/16/2009   CHOLHDL 4.7 Ratio 06/17/2008           Constitutional: no recent weight gain/loss  Eyes: history of blurred vision still present .  Most recent eye exam was 10/15  ENT: no nasal congestion, difficulty swallowing, no hoarseness    Cardiovascular: no chest pain or tightness on exertion.  No leg swelling.  Hypertension: None  Respiratory: no cough/shortness of breath  Gastrointestinal: no constipation, diarrhea, nausea or abdominal pain  Musculoskeletal: no muscle/joint aches   Urological:   No frequency of urination or  nocturia  Skin: no rash or infections  Neurological: no headaches.  Has no numbness, burning, pains or tingling in feet fingers were tingle numb  Memory difficulties have been present for the last  2-3 months   Psychiatric: no symptoms of depression Recently  Endocrine: No cold intolerance or history of thyroid disease  Her menstrual cycles are regular but they are relatively light recently.  No history of vaginal yeast infections      Physical Examination:  BP 108/70 mmHg  Pulse 92  Temp(Src) 98 F (36.7 C)  Resp 16  Ht 5\' 5"  (1.651 m)  Wt 278 lb 3.2 oz (126.191 kg)  BMI 46.30 kg/m2  SpO2 97%  GENERAL:         Patient has generalized obesity.   HEENT:         Eye exam shows normal external appearance. Fundus exam shows no retinopathy. Oral exam shows normal mucosa .  NECK:   There is no lymphadenopathy Thyroid is not enlarged and no nodules felt.  Carotids are normal to palpation and no bruit heard LUNGS:         Chest is symmetrical. Lungs are clear to auscultation.Marland Kitchen   HEART:         Heart sounds:  S1 and S2 are normal. No murmurs or clicks heard., no S3 or S4.   ABDOMEN:   There is no distention present. Liver and spleen are not palpable. No other mass or tenderness present.   NEUROLOGICAL:   Vibration sense is mildly reduced in distal first toes. Ankle jerks are normal bilaterally.          Diabetic foot exam shows normal monofilament sensation in the toes and plantar surfaces, no skin lesions or ulcers on the feet and normal pedal pulses MUSCULOSKELETAL:  There is no swelling or deformity of the peripheral joints. Spine is normal to inspection.   EXTREMITIES:     There is  no edema. No skin lesions present.Marland Kitchen SKIN:  acanthosis of the neck present.  No acne or hirsutism.       No rash present       ASSESSMENT:  Diabetes type 2, uncontrolled with severe obesity    Her blood sugars are poorly controlled with taking glipizide only and she is intolerant to metformin  No recent A1c available for review She is mildly symptomatic also with her hyperglycemia. Although she may be relatively insulin deficient at this time she may benefit from treatment with a combination of an SGLT2 drug and a GLP-1 drug  Discussed action of SGLT 2 drugs on lowering glucose by decreasing kidney absorption of glucose, benefits of weight loss and lower blood pressure, possible side effects including candidiasis and dosage regimen   Discussed with the patient the nature of GLP-1 drugs, the actions on various organ systems, how they benefit blood glucose control, as well as the benefit of weight loss and  increase satiety . Explained possible side effects especially nausea and vomiting initially; discussed safety information in package insert.  Described the injection technique and dosage titration of Victoza  starting with 0.6 mg once a day at the same time for the first  week and then increasing to 1.2 mg if no symptoms of nausea.  Educational brochure on Victoza and co-pay card given  Complications: none evident    PLAN:    She will start Victoza as discussed below with 0.6 mg.  Her mother also takes this and will be able to instruct her on how to do this.  Prior authorization paperwork will be done if needed  She was given a new One Touch ultra 2 monitor  Start Jardiance 10 mg daily before breakfast, may consider increasing this to 25 mg on the next visit if tolerated Continue glipizide for now but may need to reduce the dose when blood sugars are improved significantly Also discussed possibility of needing insulin at some point if blood sugars are not well-controlled Consistent exercise   will need evaluation of fasting lipids and blood sugars are better controlled  Patient Instructions  Check blood sugars on waking up .3.Marland Kitchen times a week Also check blood sugars about 2 hours after a meal and do this after different meals by rotation  Recommended blood sugar levels on waking up is 90-130 and about 2 hours after meal is 140-180 Please bring blood sugar monitor to each visit.  Start VICTOZA injection as shown once daily at the same time of the day.  Dial the dose to 0.6 mg on the pen for the first week.  You may inject in the stomach, thigh or arm. You may experience nausea in the first few days which usually goes away.  You will feel fullness of the stomach with starting the medication and should try to keep the portions at meals small. After 1 week increase the dose to 1.2mg  daily if no nausea present.   If any questions or concerns are present call the office or the [place] helpline at [phone]. Visit [email or web address].info/ for more useful information  Jardiance in ams     Counseling time on subjects discussed above is over 50% of today's 60 minute visit  Araya Roel 03/29/2015, 4:07 PM   Note: This office note was prepared with Estate agent. Any transcriptional errors that result from this process are unintentional.

## 2015-03-29 NOTE — Patient Instructions (Addendum)
Check blood sugars on waking up .3.Marland Kitchen times a week Also check blood sugars about 2 hours after a meal and do this after different meals by rotation  Recommended blood sugar levels on waking up is 90-130 and about 2 hours after meal is 140-180 Please bring blood sugar monitor to each visit.  Start VICTOZA injection as shown once daily at the same time of the day.  Dial the dose to 0.6 mg on the pen for the first week.  You may inject in the stomach, thigh or arm. You may experience nausea in the first few days which usually goes away.  You will feel fullness of the stomach with starting the medication and should try to keep the portions at meals small. After 1 week increase the dose to 1.2mg  daily if no nausea present.   If any questions or concerns are present call the office or the [place] helpline at [phone]. Visit [email or web address].info/ for more useful information  Jardiance in ams

## 2015-04-11 ENCOUNTER — Telehealth: Payer: Self-pay | Admitting: Endocrinology

## 2015-04-11 NOTE — Telephone Encounter (Signed)
Juliann Pulse from [facility] want to know why Glucose monitor was denied, please advise phone # [phone]

## 2015-04-11 NOTE — Telephone Encounter (Signed)
I spoke with Juliann Pulse and informed her per Dr. Dwyane Dee that since patient is not on Insulin, she does not need a dexcom.

## 2015-04-11 NOTE — Telephone Encounter (Deleted)
Juliann Pulse from [facility] want to know why Glucose monitor was denied, please advise [phone]

## 2015-04-11 NOTE — Telephone Encounter (Signed)
error 

## 2015-04-19 ENCOUNTER — Encounter (INDEPENDENT_AMBULATORY_CARE_PROVIDER_SITE_OTHER): Payer: Self-pay | Admitting: *Deleted

## 2015-04-26 ENCOUNTER — Other Ambulatory Visit: Payer: Self-pay | Admitting: *Deleted

## 2015-04-26 ENCOUNTER — Encounter: Payer: Self-pay | Admitting: Endocrinology

## 2015-04-26 ENCOUNTER — Ambulatory Visit (INDEPENDENT_AMBULATORY_CARE_PROVIDER_SITE_OTHER): Payer: BLUE CROSS/BLUE SHIELD | Admitting: Endocrinology

## 2015-04-26 VITALS — BP 128/68 | HR 105 | Temp 97.4°F | Resp 14 | Ht 65.0 in | Wt 274.8 lb

## 2015-04-26 DIAGNOSIS — E1165 Type 2 diabetes mellitus with hyperglycemia: Secondary | ICD-10-CM | POA: Diagnosis not present

## 2015-04-26 DIAGNOSIS — IMO0002 Reserved for concepts with insufficient information to code with codable children: Secondary | ICD-10-CM

## 2015-04-26 MED ORDER — ONETOUCH ULTRA BLUE VI STRP: ORAL_STRIP | Status: DC

## 2015-04-26 NOTE — Patient Instructions (Addendum)
Glipizide 1/2 in am and 1 in pm Jardiance in ams  Victoza 1.2mg  daily at bedtime, may try 5 clicks above 0.6 first  Check blood sugars on waking up .Marland Kitchen 2-3 .Marland Kitchen times a week Also check blood sugars about 2 hours after a meal and do this after different meals by rotation Recommended blood sugar levels on waking up is 90-130 and about 2 hours after meal is 140-180 Please bring blood sugar monitor to each visit.

## 2015-04-26 NOTE — Progress Notes (Signed)
Patient ID: Julie Berry, female   DOB: [birth year], 34 y.o.   MRN: [id number]           Reason for Appointment: follow-upfor Type 2 Diabetes  Referring physician: Gerarda Fraction  History of Present Illness:          Date of diagnosis of type 2 diabetes mellitus:   11/2013       Background history:  She thinks she had blood sugars of about 300 when she was first diagnosed; although she was complaining of some fatigue she was diagnosed with routine labs on her annual visit.  Her records indicate she had an A1c of 6.3 in 12/2011 but did not have any further A1c and her physician record  She was started on metformin twice a day but she could not tolerate it well because of diarrhea.  She thinks she tried to take it for about 6 months with some improvement in her blood sugars. Earlier this year she had not been taking metformin at all and her blood sugars apparently started increasing significantly In 6/16 her blood sugar was about 590 and glipizide 10 mg twice a day was started  Recent history:       Because she had persistently high blood sugars even with starting glipizide she was started on a regimen of Victoza and Jardiance on her initial consultation in 03/2015; her average blood sugar prior to this was 250per baseline A1c 10.6 Although she was told to increase her Victoza to 1.2 mg she did not understand this and is still taking 0.6; she may have a little nausea in the morning transiently No side effects with Jardiance Her blood sugars are significantly better although she is checking readings mostly in the morning  Current blood sugar patterns and problems identified:  Her fasting blood sugars are fairly good although the last week they are relatively higher at about 130.  She does has sporadic high readings after meals but not consistently, blood sugars have been better in the last 3 weeks  She thinks she is trying to watch her portions and carbohydrates with the help of her  mother  Even with her improved blood sugars her weight has come down 4 pounds    Oral hypoglycemic drugs the patient is taking are:   glipizide 10 mg twice a day, Jardiance 10 mg daily    Side effects from medications have been: Metfomin causes diarrhea  Compliance with the medical regimen: Fairly good  Hypoglycemia: Never    Glucose monitoring:  done  times a day         Glucometer: One Touch.      Blood Glucose readings by time of day and averages from meter download:  Mean values apply above for all meters except median for One Touch  PRE-MEAL Fasting Lunch Dinner 7-11 PM Overall  Glucose range: 108-153 79-233 116-366 116-268   Mean/median: 132 97 150 135 135    Self-care: The diet that the patient has been following is: tries to limit sodas, fast food, fried foods .     Typical meal intake: Breakfast is mostly frosted flakes cereal.  Usually eating grilled chicken at lunch and dinner                Dietician visit, most recent: 6/15               Exercise: up to 4 days a week with walking 30 minutes    Weight history:  Wt Readings from Last  3 Encounters:  04/26/15 274 lb 12.8 oz (124.648 kg)  03/29/15 278 lb 3.2 oz (126.191 kg)  07/18/14 283 lb 12.8 oz (128.731 kg)    Glycemic control:   Lab Results  Component Value Date   HGBA1C 10.6* 03/29/2015   HGBA1C 6.2 05/16/2009   Lab Results  Component Value Date   LDLCALC 131 05/16/2009   CREATININE 0.83 03/29/2015         Medication List       This list is accurate as of: 04/26/15  2:21 PM.  Always use your most recent med list.               empagliflozin 10 MG Tabs tablet  Commonly known as:  JARDIANCE  Take 10 mg by mouth daily.     glipiZIDE 10 MG tablet  Commonly known as:  GLUCOTROL  Take 10 mg by mouth daily with supper.     metFORMIN 500 MG tablet  Commonly known as:  GLUCOPHAGE  Take 500 mg by mouth 2 (two) times daily with a meal.     ONE TOUCH ULTRA TEST test strip  Generic drug:   glucose blood  Use to check blood sugar 2 times daily     ONETOUCH DELICA LANCETS 46T Misc     VICTOZA 18 MG/3ML Sopn  Generic drug:  Liraglutide  Inject 0.2 mLs (1.2 mg total) into the skin daily. Inject once daily at the same time        Allergies:  Allergies  Allergen Reactions  . Amoxicillin Anaphylaxis  . Penicillins Anaphylaxis  . Dilaudid [Hydromorphone] Nausea And Vomiting and Other (See Comments)    States felt burning all over, "felt like i was coming out of the bed"  . Latex Rash    Past Medical History  Diagnosis Date  . UTI (urinary tract infection)   . HTN (hypertension)   . Lump or mass in breast   . Leukocytosis   . Hyperglycemia   . Malaise and fatigue   . Morbid obesity   . Sleep apnea   . Depressed   . Low back pain   . Sciatica   . Gallstones   . Complication of anesthesia     heartrate dropped with anesthesia  . Diabetes mellitus without complication     Past Surgical History  Procedure Laterality Date  . Cholecystectomy  06/22/2012    Procedure: LAPAROSCOPIC CHOLECYSTECTOMY;  Surgeon: Jamesetta So, MD;  Location: [facility];  Service: General;  Laterality: N/A;  . Esophagogastroduodenoscopy N/A 06/09/2014    Procedure: ESOPHAGOGASTRODUODENOSCOPY (EGD);  Surgeon: Rogene Houston, MD;  Location: [facility];  Service: Endoscopy;  Laterality: N/A;  200-moved to 145 Ann to notify pt    Family History  Problem Relation Age of Onset  . Diabetes Mother   . Rheumatic fever Mother 58  . Migraines Father 69  . Hypertension Father   . Autism Brother     Social History:  reports that she has never smoked. She does not have any smokeless tobacco history on file. She reports that she does not drink alcohol or use illicit drugs.    Review of Systems    Lipid history:  has not been on any medications.  Lipids in 10/15 showed HDL 39, LDL 122    Lab Results  Component Value Date   CHOL 186 05/16/2009   HDL 37 05/16/2009   LDLCALC 131 05/16/2009    TRIG 89 05/16/2009   CHOLHDL 4.7 Ratio 06/17/2008  Eyes: Most recent eye exam was 10/15  She tends to get very tired or sleepy in the evenings although she has been told she has had some sleep apnea.  She does not think she has had any thyroid issues and has been checked for hypothyroidism  Physical Examination:  BP 128/68 mmHg  Pulse 105  Temp(Src) 97.4 F (36.3 C)  Resp 14  Ht 5\' 5"  (1.651 m)  Wt 274 lb 12.8 oz (124.648 kg)  BMI 45.73 kg/m2      ASSESSMENT:  Diabetes type 2, uncontrolled with severe obesity    Her blood sugars are much better controlled with adding Victoza and Jardiance to her glipizide This is even with taking only 0.6 mg of Victoza which she is tolerating fairly well except for minimal nausea She is starting to do some exercise and is generally watching her diet, weight is slightly better also   PLAN:   She will start increasing Victoza, she will try 5 clicks beyond the 0.6 dose and then 1.2 if tolerated Continue same doses of Jardiance Reduce glipizide to half tablet in the morning with Jardiance Consultation with dietitian Continue consistent exercise Needs follow-up lipids  Patient Instructions  Glipizide 1/2 in am and 1 in pm Jardiance in ams  Victoza 1.2mg  daily at bedtime, may try 5 clicks above 0.6 first  Check blood sugars on waking up .Marland Kitchen 2-3 .Marland Kitchen times a week Also check blood sugars about 2 hours after a meal and do this after different meals by rotation Recommended blood sugar levels on waking up is 90-130 and about 2 hours after meal is 140-180 Please bring blood sugar monitor to each visit.    Latoi Giraldo 04/26/2015, 2:21 PM   Note: This office note was prepared with Dragon voice recognition system technology. Any transcriptional errors that result from this process are unintentional.

## 2015-06-05 ENCOUNTER — Ambulatory Visit: Payer: BLUE CROSS/BLUE SHIELD | Admitting: Nutrition

## 2015-06-07 ENCOUNTER — Other Ambulatory Visit: Payer: Self-pay | Admitting: Obstetrics and Gynecology

## 2015-06-07 ENCOUNTER — Ambulatory Visit (INDEPENDENT_AMBULATORY_CARE_PROVIDER_SITE_OTHER): Payer: BLUE CROSS/BLUE SHIELD | Admitting: Endocrinology

## 2015-06-07 ENCOUNTER — Encounter: Payer: Self-pay | Admitting: Endocrinology

## 2015-06-07 VITALS — BP 110/72 | HR 87 | Temp 98.1°F | Resp 16 | Ht 65.0 in | Wt 271.4 lb

## 2015-06-07 DIAGNOSIS — IMO0002 Reserved for concepts with insufficient information to code with codable children: Secondary | ICD-10-CM

## 2015-06-07 DIAGNOSIS — E1165 Type 2 diabetes mellitus with hyperglycemia: Secondary | ICD-10-CM | POA: Diagnosis not present

## 2015-06-07 LAB — LIPID PANEL
Cholesterol: 165 mg/dL (ref 0–200)
HDL: 37.1 mg/dL — AB (ref >39.00)
LDL Cholesterol: 111 mg/dL — ABNORMAL HIGH (ref 0–99)
NonHDL: 128.35
Total CHOL/HDL Ratio: 4
Triglycerides: 88 mg/dL (ref 0.0–149.0)
VLDL: 17.6 mg/dL (ref 0.0–40.0)

## 2015-06-07 LAB — URINALYSIS, ROUTINE W REFLEX MICROSCOPIC
Bilirubin Urine: NEGATIVE
HGB URINE DIPSTICK: NEGATIVE
KETONES UR: NEGATIVE
LEUKOCYTES UA: NEGATIVE
NITRITE: NEGATIVE
PH: 6 (ref 5.0–8.0)
RBC / HPF: NONE SEEN (ref 0–?)
Specific Gravity, Urine: 1.015 (ref 1.000–1.030)
Total Protein, Urine: NEGATIVE
Urine Glucose: >=1000 — AB
Urobilinogen, UA: 0.2 (ref 0.0–1.0)

## 2015-06-07 LAB — BASIC METABOLIC PANEL
BUN: 18 mg/dL (ref 6–23)
CO2: 28 meq/L (ref 19–32)
Calcium: 9.1 mg/dL (ref 8.4–10.5)
Chloride: 104 mEq/L (ref 96–112)
Creatinine, Ser: 0.87 mg/dL (ref 0.40–1.20)
GFR: 94.87 mL/min (ref >60.00)
Glucose, Bld: 107 mg/dL — ABNORMAL HIGH (ref 70–99)
POTASSIUM: 3.7 meq/L (ref 3.5–5.1)
SODIUM: 140 meq/L (ref 135–145)

## 2015-06-07 LAB — MICROALBUMIN / CREATININE URINE RATIO
Creatinine,U: 121.9 mg/dL
Microalb Creat Ratio: 0.6 mg/g (ref 0.0–30.0)
Microalb, Ur: <0.7 mg/dL (ref 0.0–1.9)

## 2015-06-07 MED ORDER — GLIPIZIDE ER 5 MG PO TB24: 5.0000 mg | ORAL_TABLET | Freq: Every day | ORAL | Status: DC

## 2015-06-07 NOTE — Progress Notes (Signed)
Patient ID: Julie Berry, female   DOB: 11-20-1980, 34 y.o.   MRN: [id number]           Reason for Appointment: follow-upfor Type 2 Diabetes  Referring physician: Gerarda Fraction  History of Present Illness:          Date of diagnosis of type 2 diabetes mellitus:   11/2013       Background history:  She thinks she had blood sugars of about 300 when she was first diagnosed; although she was complaining of some fatigue she was diagnosed with routine labs on her annual visit.  Her records indicate she had an A1c of 6.3 in 12/2011 but did not have any further A1c and her physician record  She was started on metformin twice a day but she could not tolerate it well because of diarrhea.  She thinks she tried to take it for about 6 months with some improvement in her blood sugars. Earlier this year she had not been taking metformin at all and her blood sugars apparently started increasing significantly In 6/16 her blood sugar was about 590 and glipizide 10 mg twice a day was started  Recent history:       Because she had persistently high blood sugars even with starting glipizide she was started on a regimen of Victoza and Jardiance on her initial consultation in 03/2015; her average blood sugar prior to this was 250 per baseline A1c 10.6  She is now taking 1.2 mg Victoza since 04/2015, however she did not increase the dose to the 0.9 dosage first and is complaining about having nausea She says her nausea is mostly in the mornings and usually not having symptoms during the day She does find it reduces her portions although has lost only 3 pounds She is also taking Jardiance.  Current blood sugar patterns and problems identified:  Her fasting blood sugars are fairly good although last week there were higher around 150-160  Has done a few readings in the evenings before and after meals and these are excellent  No hypoglycemia during the day and has reduced her glipizide to half tablet in the  morning  She has tried to walk in the neighborhood  She thinks she is trying to watch her portions and carbohydrates  Oral hypoglycemic drugs the patient is taking are:   glipizide 10 mg twice a day, Jardiance 10 mg daily    Side effects from medications have been: Metfomin causes diarrhea  Compliance with the medical regimen: Fairly good  Hypoglycemia: Never    Glucose monitoring:  done about 1 time a day         Glucometer: One Touch.      Blood Glucose readings by time of day and averages from meter download:  Mean values apply above for all meters except median for One Touch  PRE-MEAL Fasting Lunch  6-10 PM   Overall  Glucose range:  107-171    121-139     Mean/median:  136     136    Self-care: The diet that the patient has been following is: tries to limit sodas, fast food, fried foods .     Typical meal intake: Breakfast is mostly frosted flakes cereal.  Usually eating grilled chicken at lunch and dinner                Dietician visit, most recent: 6/15               Exercise: up to  4 days a week with walking 30 minutes    Weight history:  Wt Readings from Last 3 Encounters:  06/07/15 271 lb 6.4 oz (123.106 kg)  04/26/15 274 lb 12.8 oz (124.648 kg)  03/29/15 278 lb 3.2 oz (126.191 kg)    Glycemic control:   Lab Results  Component Value Date   HGBA1C 10.6* 03/29/2015   HGBA1C 6.2 05/16/2009   Lab Results  Component Value Date   LDLCALC 131 05/16/2009   CREATININE 0.83 03/29/2015         Medication List       This list is accurate as of: 06/07/15  9:53 AM.  Always use your most recent med list.               empagliflozin 10 MG Tabs tablet  Commonly known as:  JARDIANCE  Take 10 mg by mouth daily.     glipiZIDE 5 MG 24 hr tablet  Commonly known as:  GLUCOTROL XL  Take 1 tablet (5 mg total) by mouth daily with breakfast.     metFORMIN 500 MG tablet  Commonly known as:  GLUCOPHAGE  Take 500 mg by mouth 2 (two) times daily with a meal.      ONE TOUCH ULTRA TEST test strip  Generic drug:  glucose blood  Use to check blood sugar 2 times daily     ONETOUCH DELICA LANCETS 71I Misc     VICTOZA 18 MG/3ML Sopn  Generic drug:  Liraglutide  Inject 0.2 mLs (1.2 mg total) into the skin daily. Inject once daily at the same time        Allergies:  Allergies  Allergen Reactions  . Amoxicillin Anaphylaxis  . Penicillins Anaphylaxis  . Dilaudid [Hydromorphone] Nausea And Vomiting and Other (See Comments)    States felt burning all over, "felt like i was coming out of the bed"  . Latex Rash    Past Medical History  Diagnosis Date  . UTI (urinary tract infection)   . HTN (hypertension)   . Lump or mass in breast   . Leukocytosis   . Hyperglycemia   . Malaise and fatigue   . Morbid obesity ([place])   . Sleep apnea   . Depressed   . Low back pain   . Sciatica   . Gallstones   . Complication of anesthesia     heartrate dropped with anesthesia  . Diabetes mellitus without complication [facility])     Past Surgical History  Procedure Laterality Date  . Cholecystectomy  06/22/2012    Procedure: LAPAROSCOPIC CHOLECYSTECTOMY;  Surgeon: Jamesetta So, MD;  Location: [facility];  Service: General;  Laterality: N/A;  . Esophagogastroduodenoscopy N/A 06/09/2014    Procedure: ESOPHAGOGASTRODUODENOSCOPY (EGD);  Surgeon: Rogene Houston, MD;  Location: [facility];  Service: Endoscopy;  Laterality: N/A;  200-moved to 145 Ann to notify pt    Family History  Problem Relation Age of Onset  . Diabetes Mother   . Rheumatic fever Mother 73  . Migraines Father 31  . Hypertension Father   . Autism Brother     Social History:  reports that she has never smoked. She does not have any smokeless tobacco history on file. She reports that she does not drink alcohol or use illicit drugs.    Review of Systems   She is complaining of some vaginal irritation without discharge and is going to see her gynecologist today   Lipid history:  has not been  on any medications.  Lipids in 05/2014 showed HDL 39, LDL 122    Lab Results  Component Value Date   CHOL 186 05/16/2009   HDL 37 05/16/2009   LDLCALC 131 05/16/2009   TRIG 89 05/16/2009   CHOLHDL 4.7 Ratio 06/17/2008           Eyes: Most recent eye exam was 10/15  Has had less fatigue recently  Lab Results  Component Value Date   TSH 1.475 08/05/2013   TSH 0.922 05/16/2009   TSH 1.548 06/17/2008    Physical Examination:  BP 110/72 mmHg  Pulse 87  Temp(Src) 98.1 F (36.7 C)  Resp 16  Ht 5\' 5"  (1.651 m)  Wt 271 lb 6.4 oz (123.106 kg)  BMI 45.16 kg/m2  SpO2 97%  No edema present    ASSESSMENT:  Diabetes type 2, uncontrolled with severe obesity    Her blood sugars are  better controlled with 1.2 mg Victoza and Jardiance and twice a day glipizide She is having nausea in the mornings and may not be able to tolerate 1.2 mg dose Blood sugars are relatively higher fasting and not increased in the evening She is generally compliant with her diet and exercise regimen and is slowly losing weight  PLAN:   She will try 5 clicks beyond the 0.6 dose as demonstrated today Continue same dose of Jardiance since she may be having some Candida vaginitis Change glipizide glipizide ER 5 mg daily Consider trial of metformin ER 500 mg Check fructosamine today and A1c on the next visit  Needs follow-up lipids  Patient Instructions  Victoza 0.9mg  daily  Change Glipizide   Ott Zimmerle 06/07/2015, 9:53 AM   Note: This office note was prepared with Estate agent. Any transcriptional errors that result from this process are unintentional.

## 2015-06-07 NOTE — Patient Instructions (Addendum)
Victoza 0.9mg  daily  Change Glipizide  Check blood sugars on waking up .. 3 .. times a week Also check blood sugars about 2 hours after a meal and do this after different meals by rotation  Recommended blood sugar levels on waking up is 90-130 and about 2 hours after meal is 140-180 Please bring blood sugar monitor to each visit.

## 2015-06-08 LAB — FRUCTOSAMINE: Fructosamine: 230 umol/L (ref 0–285)

## 2015-06-08 LAB — CYTOLOGY - PAP

## 2015-06-24 ENCOUNTER — Other Ambulatory Visit: Payer: Self-pay | Admitting: Endocrinology

## 2015-07-07 ENCOUNTER — Encounter: Payer: BLUE CROSS/BLUE SHIELD | Admitting: Nutrition

## 2015-07-29 ENCOUNTER — Other Ambulatory Visit: Payer: Self-pay | Admitting: Endocrinology

## 2015-08-08 ENCOUNTER — Ambulatory Visit: Payer: BLUE CROSS/BLUE SHIELD | Admitting: Endocrinology

## 2015-08-17 ENCOUNTER — Encounter: Payer: Self-pay | Admitting: Endocrinology

## 2015-08-17 ENCOUNTER — Ambulatory Visit (INDEPENDENT_AMBULATORY_CARE_PROVIDER_SITE_OTHER): Payer: BLUE CROSS/BLUE SHIELD | Admitting: Endocrinology

## 2015-08-17 VITALS — BP 110/70 | HR 82 | Temp 97.8°F | Resp 16 | Ht 65.0 in | Wt 274.0 lb

## 2015-08-17 DIAGNOSIS — E1165 Type 2 diabetes mellitus with hyperglycemia: Secondary | ICD-10-CM

## 2015-08-17 DIAGNOSIS — E1136 Type 2 diabetes mellitus with diabetic cataract: Secondary | ICD-10-CM

## 2015-08-17 DIAGNOSIS — IMO0002 Reserved for concepts with insufficient information to code with codable children: Secondary | ICD-10-CM

## 2015-08-17 LAB — POCT GLYCOSYLATED HEMOGLOBIN (HGB A1C): HEMOGLOBIN A1C: 6.1

## 2015-08-17 NOTE — Patient Instructions (Addendum)
Check blood sugars on waking up 2-3  times a week Also check blood sugars about 2 hours after a meal and do this after different meals by rotation  Recommended blood sugar levels on waking up is 90-130 and about 2 hours after meal is 130-160  Please bring your blood sugar monitor to each visit, thank you  Exercise daily  Call if sugars low

## 2015-08-17 NOTE — Progress Notes (Signed)
Patient ID: Julie Berry, female   DOB: [birth year], 34 y.o.   MRN: [id number]           Reason for Appointment: follow-upfor Type 2 Diabetes  Referring physician: Gerarda Fraction  History of Present Illness:          Date of diagnosis of type 2 diabetes mellitus:   11/2013       Background history:  She thinks she had blood sugars of about 300 when she was first diagnosed; although she was complaining of some fatigue she was diagnosed with routine labs on her annual visit.  Her records indicate she had an A1c of 6.3 in 12/2011 but did not have any further A1c and her physician record  She was started on metformin twice a day but she could not tolerate it well because of diarrhea.  She thinks she tried to take it for about 6 months with some improvement in her blood sugars. Earlier this year she had not been taking metformin at all and her blood sugars apparently started increasing significantly In 6/16 her blood sugar was about 590 and glipizide 10 mg twice a day was started  Recent history:       Because she had persistently high blood sugars even with starting glipizide she was started on a regimen of Victoza and Jardiance on her initial consultation in 03/2015; her average blood sugar prior to this was 250 per baseline A1c 10.6  She is now taking Victoza with the 0.9 dosage first and is not complaining about having nausea that she was having with 1.2 mg She is also taking Jardiance. Her glipizide was changed to glipizide ER 5 mg daily in 10/16 Her A1c is now come down to 6.1  Current blood sugar patterns and problems identified:  Her fasting blood sugars are fairly good with occasional high normal readings  Has done very few readings in the evenings and is fairly good usually except for one reading of 183  No hypoglycemia during the day and she may feel a little headache in the morning when her blood sugars around 90+  She has tried to walk in the neighborhood fairly regularly, not when  the weather is not permitting  She thinks she is trying to watch her portions and carbohydrates but has gained some weight over the holidays  Oral hypoglycemic drugs the patient is taking are:   glipizide 10 mg twice a day, Jardiance 10 mg daily    Side effects from medications have been: Metfomin causes diarrhea  Compliance with the medical regimen: Fairly good  Hypoglycemia: Never    Glucose monitoring:  done about 1 time a day         Glucometer: One Touch.      Blood Glucose readings by time of day and averages from meter download:  Mean values apply above for all meters except median for One Touch  PRE-MEAL Fasting Lunch Dinner Bedtime Overall  Glucose range:  94-148    115   118-183    Mean/median: 128     125      Self-care: The diet that the patient has been following is: tries to limit sodas, fast food, fried foods .     Typical meal intake: Breakfast is mostly frosted flakes cereal.  Usually eating grilled chicken at lunch and dinner                Dietician visit, most recent: 6/15  Exercise: up to 4 days a week with walking 30 minutes    Weight history:  Wt Readings from Last 3 Encounters:  08/17/15 274 lb (124.286 kg)  06/07/15 271 lb 6.4 oz (123.106 kg)  04/26/15 274 lb 12.8 oz (124.648 kg)    Glycemic control:   Lab Results  Component Value Date   HGBA1C 6.1 08/17/2015   HGBA1C 10.6* 03/29/2015   HGBA1C 6.2 05/16/2009   Lab Results  Component Value Date   MICROALBUR <0.7 06/07/2015   LDLCALC 111* 06/07/2015   CREATININE 0.87 06/07/2015         Medication List       This list is accurate as of: 08/17/15  9:51 PM.  Always use your most recent med list.               glipiZIDE 5 MG 24 hr tablet  Commonly known as:  GLUCOTROL XL  Take 1 tablet (5 mg total) by mouth daily with breakfast.     JARDIANCE 10 MG Tabs tablet  Generic drug:  empagliflozin  TAKE 1 TABLET BY MOUTH ONCE DAILY.     ONE TOUCH ULTRA TEST test strip    Generic drug:  glucose blood  Use to check blood sugar 2 times daily     ONETOUCH DELICA LANCETS 99991111 Misc     VICTOZA 18 MG/3ML Sopn  Generic drug:  Liraglutide  INJECT 1.2MG  SUBCUTANEOUSLY ONCE DAILY AT THE SAME TIME.        Allergies:  Allergies  Allergen Reactions  . Amoxicillin Anaphylaxis  . Penicillins Anaphylaxis  . Dilaudid [Hydromorphone] Nausea And Vomiting and Other (See Comments)    States felt burning all over, "felt like i was coming out of the bed"  . Latex Rash    Past Medical History  Diagnosis Date  . UTI (urinary tract infection)   . HTN (hypertension)   . Lump or mass in breast   . Leukocytosis   . Hyperglycemia   . Malaise and fatigue   . Morbid obesity ([place])   . Sleep apnea   . Depressed   . Low back pain   . Sciatica   . Gallstones   . Complication of anesthesia     heartrate dropped with anesthesia  . Diabetes mellitus without complication [facility])     Past Surgical History  Procedure Laterality Date  . Cholecystectomy  06/22/2012    Procedure: LAPAROSCOPIC CHOLECYSTECTOMY;  Surgeon: Jamesetta So, MD;  Location: [facility];  Service: General;  Laterality: N/A;  . Esophagogastroduodenoscopy N/A 06/09/2014    Procedure: ESOPHAGOGASTRODUODENOSCOPY (EGD);  Surgeon: Rogene Houston, MD;  Location: [facility];  Service: Endoscopy;  Laterality: N/A;  200-moved to 145 Ann to notify pt    Family History  Problem Relation Age of Onset  . Diabetes Mother   . Rheumatic fever Mother 19  . Migraines Father 65  . Hypertension Father   . Autism Brother   . Heart disease Neg Hx     Social History:  reports that she has never smoked. She does not have any smokeless tobacco history on file. She reports that she does not drink alcohol or use illicit drugs.    Review of Systems     Lipid history:  has not been on any medications.  No other risk factors except diabetes    Lab Results  Component Value Date   CHOL 165 06/07/2015   HDL 37.10*  06/07/2015   LDLCALC 111* 06/07/2015  TRIG 88.0 06/07/2015   CHOLHDL 4 06/07/2015           Eyes: Most recent eye exam was 10/15    Physical Examination:  BP 110/70 mmHg  Pulse 82  Temp(Src) 97.8 F (36.6 C)  Resp 16  Ht 5\' 5"  (1.651 m)  Wt 274 lb (124.286 kg)  BMI 45.60 kg/m2  SpO2 98%     ASSESSMENT:  Diabetes type 2, uncontrolled with severe obesity    Her blood sugars are excellent now with her regimen of glipizide ER, Victoza 0.9 mg and low-dose Jardiance She is not losing weight recently because of inconsistent diet and not as much exercise No side effects from current regimen of Victoza for Jardiance She does not check many readings after meals and discussed need to very the timing of her monitoring  PLAN:   She will continue the same regimen but start working on her weight loss regimen next month Discussed sticking lipids again and may consider starting drug treatment if LDL consistently over 100    Patient Instructions  Check blood sugars on waking up 2-3  times a week Also check blood sugars about 2 hours after a meal and do this after different meals by rotation  Recommended blood sugar levels on waking up is 90-130 and about 2 hours after meal is 130-160  Please bring your blood sugar monitor to each visit, thank you  Exercise daily  Call if sugars low   Julie Berry 08/17/2015, 9:51 PM   Note: This office note was prepared with Dragon voice recognition system technology. Any transcriptional errors that result from this process are unintentional.

## 2015-11-15 ENCOUNTER — Ambulatory Visit: Payer: BLUE CROSS/BLUE SHIELD | Admitting: Endocrinology

## 2015-11-21 DIAGNOSIS — Z79899 Other long term (current) drug therapy: Secondary | ICD-10-CM | POA: Insufficient documentation

## 2015-11-21 DIAGNOSIS — E1165 Type 2 diabetes mellitus with hyperglycemia: Secondary | ICD-10-CM | POA: Diagnosis not present

## 2015-11-21 DIAGNOSIS — F329 Major depressive disorder, single episode, unspecified: Secondary | ICD-10-CM | POA: Insufficient documentation

## 2015-11-21 DIAGNOSIS — I10 Essential (primary) hypertension: Secondary | ICD-10-CM | POA: Insufficient documentation

## 2015-11-21 DIAGNOSIS — M62838 Other muscle spasm: Secondary | ICD-10-CM | POA: Diagnosis not present

## 2015-11-21 DIAGNOSIS — Z7984 Long term (current) use of oral hypoglycemic drugs: Secondary | ICD-10-CM | POA: Diagnosis not present

## 2015-11-21 DIAGNOSIS — R531 Weakness: Secondary | ICD-10-CM | POA: Diagnosis present

## 2015-11-22 ENCOUNTER — Emergency Department (HOSPITAL_COMMUNITY)
Admission: EM | Admit: 2015-11-22 | Discharge: 2015-11-22 | Disposition: A | Discharge status: Home / Self Care | Payer: BLUE CROSS/BLUE SHIELD | Attending: Emergency Medicine | Admitting: Emergency Medicine

## 2015-11-22 ENCOUNTER — Encounter (HOSPITAL_COMMUNITY): Payer: Self-pay | Admitting: *Deleted

## 2015-11-22 DIAGNOSIS — R531 Weakness: Secondary | ICD-10-CM

## 2015-11-22 DIAGNOSIS — M62838 Other muscle spasm: Secondary | ICD-10-CM

## 2015-11-22 LAB — CBC WITH DIFFERENTIAL/PLATELET
Basophils Absolute: 0 10*3/uL (ref 0.0–0.1)
Basophils Relative: 0 %
EOS PCT: 2 %
Eosinophils Absolute: 0.2 10*3/uL (ref 0.0–0.7)
HCT: 38.5 % (ref 36.0–46.0)
Hemoglobin: 12.3 g/dL (ref 12.0–15.0)
LYMPHS ABS: 3 10*3/uL (ref 0.7–4.0)
LYMPHS PCT: 25 %
MCH: 26.7 pg (ref 26.0–34.0)
MCHC: 31.9 g/dL (ref 30.0–36.0)
MCV: 83.7 fL (ref 78.0–100.0)
MONO ABS: 0.8 10*3/uL (ref 0.1–1.0)
MONOS PCT: 6 %
Neutro Abs: 8.1 10*3/uL — ABNORMAL HIGH (ref 1.7–7.7)
Neutrophils Relative %: 67 %
PLATELETS: 294 10*3/uL (ref 150–400)
RBC: 4.6 MIL/uL (ref 3.87–5.11)
RDW: 14.8 % (ref 11.5–15.5)
WBC: 12.2 10*3/uL — AB (ref 4.0–10.5)

## 2015-11-22 LAB — COMPREHENSIVE METABOLIC PANEL
ALT: 16 U/L (ref 14–54)
AST: 16 U/L (ref 15–41)
Albumin: 3.6 g/dL (ref 3.5–5.0)
Alkaline Phosphatase: 55 U/L (ref 38–126)
Anion gap: 7 (ref 5–15)
BILIRUBIN TOTAL: 0.4 mg/dL (ref 0.3–1.2)
BUN: 21 mg/dL — ABNORMAL HIGH (ref 6–20)
CO2: 26 mmol/L (ref 22–32)
Calcium: 8.5 mg/dL — ABNORMAL LOW (ref 8.9–10.3)
Chloride: 104 mmol/L (ref 101–111)
Creatinine, Ser: 1 mg/dL (ref 0.44–1.00)
Glucose, Bld: 94 mg/dL (ref 65–99)
Potassium: 3.8 mmol/L (ref 3.5–5.1)
Sodium: 137 mmol/L (ref 135–145)
Total Protein: 7.6 g/dL (ref 6.5–8.1)

## 2015-11-22 LAB — CBG MONITORING, ED: Glucose-Capillary: 100 mg/dL — ABNORMAL HIGH (ref 65–99)

## 2015-11-22 MED ORDER — CYCLOBENZAPRINE HCL 5 MG PO TABS: 5.0000 mg | ORAL_TABLET | Freq: Three times a day (TID) | ORAL | Status: DC | PRN

## 2015-11-22 MED ORDER — NAPROXEN 500 MG PO TABS: ORAL_TABLET | ORAL | Status: DC

## 2015-11-22 MED ORDER — KETOROLAC TROMETHAMINE 60 MG/2ML IM SOLN
60.0000 mg | Freq: Once | INTRAMUSCULAR | Status: AC
  Administered 2015-11-22: 60 mg via INTRAMUSCULAR
  Filled 2015-11-22: qty 2

## 2015-11-22 NOTE — ED Provider Notes (Signed)
CSN: [id number]     Arrival date & time 11/21/15  2359 History  By signing my name below, I, Dora Sims, attest that this documentation has been prepared under the direction and in the presence of physician practitioner, Rolland Porter, MD at [place] AM,. Electronically Signed: Dora Sims, Scribe. 11/22/2015. 12:19 AM.    Chief Complaint  Patient presents with  . Weakness    The history is provided by the patient. No language interpreter was used.     HPI Comments: Julie Berry is a 35 y.o. female with h/o DM who presents to the Emergency Department complaining of sudden onset, constant, lightheadedness and dizziness for the last 4 days. Pt notes that she experiencing a sharp, throbbing, headache 4 days ago that began midday and lasted throughout the day. Pt notes that she woke up the following day and her headache was gone, but she experienced posterior neck pain as well as lower back pain, weakness, lightheadedness, and dizziness. Pt has no h/o headaches; her last reported headache is 5 years ago. Pt does not believe her headache was related to any activity change. 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 Pt saw her PCP, Dr. Gerarda Fraction, yesterday morning and was given a steroid shot for lower back pain and Levaquin 500 mg x1 daily for 10 days for her "pulling" sensation while urinating for a UTI. She notes that the Levoquin has improved her urinary issues. Dr. Gerarda Fraction also told pt that she had a low grade fever. She notes that she came to the ER today because she felt like she was going to pass out due to significant lightheadedness. Pt does not smoke or drink. She has no h/o back problems. She denies irregular or heavy periods. She has been eating and drinking normally. She also endorses intermittent, mild nausea but denies vomiting. She denies diarrhea or any other associated symptoms.  PCP Dr  Gerarda Fraction   Past Medical History  Diagnosis Date  . UTI (urinary tract infection)   . HTN (hypertension)   . Lump or mass in breast   . Leukocytosis   . Hyperglycemia   . Malaise and fatigue   . Morbid obesity ([place])   . Sleep apnea   . Depressed   . Low back pain   . Sciatica   . Gallstones   . Complication of anesthesia     heartrate dropped with anesthesia  . Diabetes mellitus without complication [facility])    Past Surgical History  Procedure Laterality Date  . Cholecystectomy  06/22/2012    Procedure: LAPAROSCOPIC CHOLECYSTECTOMY;  Surgeon: Jamesetta So, MD;  Location: [facility] ORS;  Service: General;  Laterality: N/A;  . Esophagogastroduodenoscopy N/A 06/09/2014    Procedure: ESOPHAGOGASTRODUODENOSCOPY (EGD);  Surgeon: Rogene Houston, MD;  Location: [facility] ENDO SUITE;  Service: Endoscopy;  Laterality: N/A;  200-moved to 145 Ann to notify pt   Family History  Problem Relation Age of Onset  . Diabetes Mother   . Rheumatic fever Mother 74  . Migraines Father 86  . Hypertension Father   . Autism Brother   . Heart disease Neg Hx    Social History  Substance Use Topics  . Smoking status: Never Smoker   . Smokeless tobacco: None  . Alcohol Use: No   employed  OB History    No data available     Review of Systems  Constitutional: Positive  for fever.  Gastrointestinal: Positive for nausea. Negative for vomiting and diarrhea.  Musculoskeletal: Positive for back pain (lower) and neck pain (posterior).  Neurological: Positive for dizziness, weakness, light-headedness and headaches.  All other systems reviewed and are negative.   Allergies  Amoxicillin; Penicillins; Dilaudid; and Latex  Home Medications   Prior to Admission medications   Medication Sig Start Date End Date Taking? Authorizing Provider  glipiZIDE (GLUCOTROL XL) 5 MG 24 hr tablet Take 1 tablet (5 mg total) by mouth daily with breakfast. 06/07/15  Yes Elayne Snare, MD  levofloxacin (LEVAQUIN) 500 MG tablet Take 500  mg by mouth daily.   Yes Historical Provider, MD  ONE TOUCH ULTRA TEST test strip Use to check blood sugar 2 times daily 04/26/15  Yes Elayne Snare, MD  VICTOZA 18 MG/3ML SOPN INJECT 1.2MG  SUBCUTANEOUSLY ONCE DAILY AT THE SAME TIME. 07/31/15  Yes Elayne Snare, MD  cyclobenzaprine (FLEXERIL) 5 MG tablet Take 1 tablet (5 mg total) by mouth 3 (three) times daily as needed for muscle spasms. 11/22/15   Rolland Porter, MD  JARDIANCE 10 MG TABS tablet TAKE 1 TABLET BY MOUTH ONCE DAILY. 06/26/15   Elayne Snare, MD  naproxen (NAPROSYN) 500 MG tablet Take 1 po BID with food prn pain 11/22/15   Rolland Porter, MD  [facility] DELICA LANCETS [id number] MISC  02/27/15   Historical Provider, MD   BP 126/75 mmHg  Pulse 78  Temp(Src) 98 F (36.7 C)  Resp 22  Ht 5\' 5"  (1.651 m)  Wt 250 lb (113.399 kg)  BMI 41.60 kg/m2  SpO2 100%  LMP 11/14/2015  Vital signs normal     Date/Time    11/22/15 0036   BP- Lying: 110/75 mmHg   Pulse- Lying: 81   Who: LCB     Orthostatic VS - Sitting from 11/21/15 0131 to 11/22/15 0131    Date/Time   11/22/15 0036   BP- Sitting: 113/75 mmHg   Pulse- Sitting: 80   Who: LCB     Orthostatic VS - Standing from 11/21/15 0131 to 11/22/15 0131    Date/Time   11/22/15 0036   BP- Standing at 0 minutes: 126/79 mmHg   Pulse- Standing at 0 minutes: 88   Who: LCB      BP- Standing at 0 minutes: Patient stated that she feels a little weak.       Orthostatic VS are normal     Physical Exam  Constitutional: She is oriented to person, place, and time. She appears well-developed and well-nourished.  Non-toxic appearance. She does not appear ill. No distress.  HENT:  Head: Normocephalic and atraumatic.  Right Ear: External ear normal.  Left Ear: External ear normal.  Nose: Nose normal. No mucosal edema or rhinorrhea.  Mouth/Throat: Oropharynx is clear and moist and mucous membranes are normal. No dental abscesses or uvula swelling.  Eyes: Conjunctivae and EOM are normal. Pupils are  equal, round, and reactive to light.  Neck: Normal range of motion and full passive range of motion without pain. Neck supple.    Cardiovascular: Normal rate, regular rhythm and normal heart sounds.  Exam reveals no gallop and no friction rub.   No murmur heard. Pulmonary/Chest: Effort normal and breath sounds normal. No respiratory distress. She has no wheezes. She has no rhonchi. She has no rales. She exhibits no tenderness and no crepitus.  Abdominal: Soft. Normal appearance and bowel sounds are normal. She exhibits no distension. There is no tenderness. There is no rebound and no  guarding.  Musculoskeletal: Normal range of motion. She exhibits no edema or tenderness.  Moves all extremities well.  Tender trapezius muscles bilaterally  Neurological: She is alert and oriented to person, place, and time. She has normal strength. No cranial nerve deficit.  Skin: Skin is warm, dry and intact. No rash noted. No erythema. No pallor.  Psychiatric: She has a normal mood and affect. Her speech is normal and behavior is normal. Her mood appears not anxious.  Nursing note and vitals reviewed.   ED Course  Procedures (including critical care time)  Medications  ketorolac (TORADOL) injection 60 mg (60 mg Intramuscular Given 11/22/15 0105)     DIAGNOSTIC STUDIES: Oxygen Saturation is 100% on RA, normal by my interpretation.    COORDINATION OF CARE: 12:19 AM Discussed treatment plan with pt at bedside and pt agreed to plan. Patient was given Toradol, she drove herself to the ED, for her trapezius muscle pain.  Patient was rechecked at 1:30 AM. She reports the Toradol has helped with her trapezius muscle pain. We discussed her laboratory results. The only thing of significance was her BUN was a little elevated which could be a subtle sign of dehydration. I have asked her to drink a bottle of Gatorade this morning on the way home and then also later today.       Labs Review Results for orders  placed or performed during the hospital encounter of 11/22/15  Comprehensive metabolic panel  Result Value Ref Range   Sodium 137 135 - 145 mmol/L   Potassium 3.8 3.5 - 5.1 mmol/L   Chloride 104 101 - 111 mmol/L   CO2 26 22 - 32 mmol/L   Glucose, Bld 94 65 - 99 mg/dL   BUN 21 (H) 6 - 20 mg/dL   Creatinine, Ser 1.00 0.44 - 1.00 mg/dL   Calcium 8.5 (L) 8.9 - 10.3 mg/dL   Total Protein 7.6 6.5 - 8.1 g/dL   Albumin 3.6 3.5 - 5.0 g/dL   AST 16 15 - 41 U/L   ALT 16 14 - 54 U/L   Alkaline Phosphatase 55 38 - 126 U/L   Total Bilirubin 0.4 0.3 - 1.2 mg/dL   GFR calc non Af Amer >60 >60 mL/min   GFR calc Af Amer >60 >60 mL/min   Anion gap 7 5 - 15  CBC with Differential  Result Value Ref Range   WBC 12.2 (H) 4.0 - 10.5 K/uL   RBC 4.60 3.87 - 5.11 MIL/uL   Hemoglobin 12.3 12.0 - 15.0 g/dL   HCT 38.5 36.0 - 46.0 %   MCV 83.7 78.0 - 100.0 fL   MCH 26.7 26.0 - 34.0 pg   MCHC 31.9 30.0 - 36.0 g/dL   RDW 14.8 11.5 - 15.5 %   Platelets 294 150 - 400 K/uL   Neutrophils Relative % 67 %   Neutro Abs 8.1 (H) 1.7 - 7.7 K/uL   Lymphocytes Relative 25 %   Lymphs Abs 3.0 0.7 - 4.0 K/uL   Monocytes Relative 6 %   Monocytes Absolute 0.8 0.1 - 1.0 K/uL   Eosinophils Relative 2 %   Eosinophils Absolute 0.2 0.0 - 0.7 K/uL   Basophils Relative 0 %   Basophils Absolute 0.0 0.0 - 0.1 K/uL  POC CBG, ED  Result Value Ref Range   Glucose-Capillary 100 (H) 65 - 99 mg/dL   Laboratory interpretation all normal except elevated BUN, leukocytosis   I have personally reviewed and evaluated these images and lab  results as part of my medical decision-making.   MDM   Final diagnoses:  Trapezius muscle spasm  Weakness     Discharge Medication List as of 11/22/2015  2:08 AM    START taking these medications   Details  cyclobenzaprine (FLEXERIL) 5 MG tablet Take 1 tablet (5 mg total) by mouth 3 (three) times daily as needed for muscle spasms., Starting 11/22/2015, Until Discontinued, Print    naproxen  (NAPROSYN) 500 MG tablet Take 1 po BID with food prn pain, Print        Plan discharge  Rolland Porter, MD, Barbette Or, MD 11/22/15 Rogene Houston

## 2015-11-22 NOTE — ED Notes (Signed)
Pt alert & oriented x4, stable gait. Patient given discharge instructions, paperwork & prescription(s). Patient  instructed to stop at the registration desk to finish any additional paperwork. Patient verbalized understanding. Pt left department w/ no further questions. 

## 2015-11-22 NOTE — Discharge Instructions (Signed)
Use ice and heat on the sore muscles in your shoulders for comfort. Take the medications as prescribed. Try to drink a bottle of gatorade tonight and another tomorrow. Recheck if you aren't improving over the next several days.  Weakness Weakness is a lack of strength. You may feel weak all over your body or just in one part of your body. Weakness can be serious. In some cases, you may need more medical tests. HOME [place] a well-balanced diet.  Try to exercise every day.  Only take medicines as told by your doctor. GET HELP RIGHT AWAY IF:   You cannot do your normal daily activities.  You cannot walk up and down stairs, or you feel very tired when you do so.  You have shortness of breath or chest pain.  You have trouble moving parts of your body.  You have weakness in only one body part or on only one side of the body.  You have a fever.  You have trouble speaking or swallowing.  You cannot control when you pee (urinate) or poop (bowel movement).  You have black or bloody throw up (vomit) or poop.  Your weakness gets worse or spreads to other body parts.  You have new aches or pains. MAKE SURE YOU:   Understand these instructions.  Will watch your condition.  Will get help right away if you are not doing well or get worse.   This information is not intended to replace advice given to you by your health care provider. Make sure you discuss any questions you have with your health care provider.   Document Released: 07/18/2008 Document Revised: 02/04/2012 Document Reviewed: 10/04/2011 Elsevier Interactive Patient Education 2016 [place] therapy can help ease sore, stiff, injured, and tight muscles and joints. Heat relaxes your muscles, which may help ease your pain. Heat therapy should only be used on old, pre-existing, or long-lasting (chronic) injuries. Do not use heat therapy unless told by your doctor. HOW TO USE HEAT  THERAPY There are several different kinds of heat therapy, including:  Moist heat pack.  Warm water bath.  Hot water bottle.  Electric heating pad.  Heated gel pack.  Heated wrap.  Electric heating pad. GENERAL HEAT THERAPY RECOMMENDATIONS   Do not sleep while using heat therapy. Only use heat therapy while you are awake.  Your skin may turn pink while using heat therapy. Do not use heat therapy if your skin turns red.  Do not use heat therapy if you have new pain.  High heat or long exposure to heat can cause burns. Be careful when using heat therapy to avoid burning your skin.  Do not use heat therapy on areas of your skin that are already irritated, such as with a rash or sunburn. GET HELP IF:   You have blisters, redness, swelling (puffiness), or numbness.  You have new pain.  Your pain is worse. MAKE SURE YOU:  Understand these instructions.  Will watch your condition.  Will get help right away if you are not doing well or get worse.   This information is not intended to replace advice given to you by your health care provider. Make sure you discuss any questions you have with your health care provider.   Document Released: 10/28/2011 Document Revised: 08/26/2014 Document Reviewed: 09/28/2013 Elsevier Interactive Patient Education [employer].

## 2015-11-22 NOTE — ED Notes (Signed)
Pt states she had a bad headache on Friday & has been weak ever since. Pt was seen by her PCP yesterday.

## 2016-01-02 ENCOUNTER — Other Ambulatory Visit: Payer: Self-pay | Admitting: Endocrinology

## 2016-01-17 ENCOUNTER — Ambulatory Visit: Payer: BLUE CROSS/BLUE SHIELD | Admitting: Endocrinology

## 2016-01-24 ENCOUNTER — Telehealth: Payer: Self-pay | Admitting: Endocrinology

## 2016-01-24 ENCOUNTER — Other Ambulatory Visit: Payer: Self-pay | Admitting: Endocrinology

## 2016-01-24 NOTE — Telephone Encounter (Signed)
See note below and please advise, Thanks! 

## 2016-01-24 NOTE — Telephone Encounter (Signed)
Did she tell you by she is out of her Victoza?

## 2016-01-24 NOTE — Telephone Encounter (Signed)
Patient stated that it was to soon for her medication Victoza to be filled and she is out,, is there something else you can call in, or is it ok for her to go without her medication Victoza until until it is filled.

## 2016-01-25 MED ORDER — LIRAGLUTIDE 18 MG/3ML ~~LOC~~ SOPN: PEN_INJECTOR | SUBCUTANEOUS | Status: DC

## 2016-01-25 NOTE — Telephone Encounter (Signed)
Pt advised Julie Berry she was out of her medication.

## 2016-01-25 NOTE — Telephone Encounter (Signed)
We can only give her a 30 day prescription.  She needs to be seen within 30 days, has not been seen since 12/16

## 2016-01-25 NOTE — Telephone Encounter (Signed)
I contacted the pt and advised to call back and make an appointment with Dr. Dwyane Dee. 30 day refill submitted to the pharmacy.

## 2016-03-06 ENCOUNTER — Ambulatory Visit (INDEPENDENT_AMBULATORY_CARE_PROVIDER_SITE_OTHER): Payer: BLUE CROSS/BLUE SHIELD | Admitting: Endocrinology

## 2016-03-06 ENCOUNTER — Encounter: Payer: Self-pay | Admitting: Endocrinology

## 2016-03-06 VITALS — BP 122/80 | HR 98 | Ht 65.0 in | Wt 288.0 lb

## 2016-03-06 DIAGNOSIS — E1136 Type 2 diabetes mellitus with diabetic cataract: Secondary | ICD-10-CM | POA: Diagnosis not present

## 2016-03-06 DIAGNOSIS — IMO0002 Reserved for concepts with insufficient information to code with codable children: Secondary | ICD-10-CM

## 2016-03-06 DIAGNOSIS — E1165 Type 2 diabetes mellitus with hyperglycemia: Secondary | ICD-10-CM

## 2016-03-06 MED ORDER — ONETOUCH ULTRA BLUE VI STRP: ORAL_STRIP | Status: DC

## 2016-03-06 MED ORDER — EMPAGLIFLOZIN 25 MG PO TABS: 25.0000 mg | ORAL_TABLET | Freq: Every day | ORAL | Status: DC

## 2016-03-06 MED ORDER — GLIPIZIDE ER 5 MG PO TB24: ORAL_TABLET | ORAL | Status: DC

## 2016-03-06 MED ORDER — LIRAGLUTIDE 18 MG/3ML ~~LOC~~ SOPN: PEN_INJECTOR | SUBCUTANEOUS | Status: DC

## 2016-03-06 NOTE — Patient Instructions (Signed)
Check blood sugars on waking up 2  times a week Also check blood sugars about 2 hours after a meal and do this after different meals by rotation  Recommended blood sugar levels on waking up is 90-130 and about 2 hours after meal is 130-160  Please bring your blood sugar monitor to each visit, thank you  Walk daily  Call if sugars low

## 2016-03-06 NOTE — Progress Notes (Signed)
Patient ID: Julie Berry, female   DOB: 11-May-1981, 35 y.o.   MRN: [id number]           Reason for Appointment: follow-upfor Type 2 Diabetes  Referring physician: Gerarda Fraction  History of Present Illness:          Date of diagnosis of type 2 diabetes mellitus:   11/2013       Background history:  She thinks she had blood sugars of about 300 when she was first diagnosed; although she was complaining of some fatigue she was diagnosed with routine labs on her annual visit.  Her records indicate she had an A1c of 6.3 in 12/2011 She was started on metformin twice a day but she could not tolerate it well because of diarrhea.  She thinks she tried to take it for about 6 months with some improvement in her blood sugars, subsequently stopped. In 6/16 her blood sugar was about 590 and glipizide 10 mg twice a day was started  Recent history:       Because she had persistently high blood sugars even with starting glipizide she was started on a regimen of Victoza and Jardiance on her initial consultation in 03/2015; her average blood sugar prior to this was 250 with baseline A1c 10.6  She is now taking Victoza 1.2 daily without side effects Her glipizide was changed to glipizide ER 5 mg daily in 10/16 Her A1c is now 6.6 but she has not been seen in follow-up since 12/16  Current blood sugar patterns and problems identified:  She has not checked blood sugars at home except once which was 37  Has not had any labs recently  Although she thinks she is taking her Victoza and Jardiance she has gained a significant amount of weight  She thinks her weight gain is from stress  She does not try to watch her diet consistently when stressed except controlling portions  Oral hypoglycemic drugs the patient is taking are:   glipizide 5mg  er,  Jardiance 10 mg daily    Side effects from medications have been: Metfomin causes diarrhea.  Candidiasis once from Jardiance  Compliance with the medical regimen:  Recently not good  Hypoglycemia: Never    Glucose monitoring:  done rarely recently  Glucometer: One Touch.      Blood Glucose readings : None  Self-care: The diet that the patient has been following is: tries to limit sodas, fast food, fried foods .     Typical meal intake: Breakfast is mostly  cereal.  Usually eating grilled chicken at lunch and sometimes salads at dinner                Dietician visit, most recent: 6/15               Exercise: up to 3 days a week with walking 30 minutes    Weight history:  Wt Readings from Last 3 Encounters:  03/06/16 288 lb (130.636 kg)  11/22/15 250 lb (113.399 kg)  08/17/15 274 lb (124.286 kg)    Glycemic control:   Lab Results  Component Value Date   HGBA1C 6.1 08/17/2015   HGBA1C 10.6* 03/29/2015   HGBA1C 6.2 05/16/2009   Lab Results  Component Value Date   MICROALBUR <0.7 06/07/2015   LDLCALC 111* 06/07/2015   CREATININE 1.00 11/22/2015         Medication List       This list is accurate as of: 03/06/16  9:24 PM.  Always use  your most recent med list.               cyclobenzaprine 5 MG tablet  Commonly known as:  FLEXERIL  Take 1 tablet (5 mg total) by mouth 3 (three) times daily as needed for muscle spasms.     empagliflozin 25 MG Tabs tablet  Commonly known as:  JARDIANCE  Take 25 mg by mouth daily.     glipiZIDE 5 MG 24 hr tablet  Commonly known as:  GLIPIZIDE XL  TAKE 1 TABLET BY MOUTH DAILY WITH BREAKFAST.     Liraglutide 18 MG/3ML Sopn  Commonly known as:  VICTOZA  INJECT 1.2MG  SUBCUTANEOUSLY ONCE DAILY AT THE SAME TIME.     ONE TOUCH ULTRA TEST test strip  Generic drug:  glucose blood  Use to check blood sugar 2 times daily     ONETOUCH DELICA LANCETS [id number] Misc        Allergies:  Allergies  Allergen Reactions  . Amoxicillin Anaphylaxis  . Penicillins Anaphylaxis  . Dilaudid [Hydromorphone] Nausea And Vomiting and Other (See Comments)    States felt burning all over, "felt like i was coming  out of the bed"  . Latex Rash    Past Medical History  Diagnosis Date  . UTI (urinary tract infection)   . HTN (hypertension)   . Lump or mass in breast   . Leukocytosis   . Hyperglycemia   . Malaise and fatigue   . Morbid obesity ([employer] [employer])   . Sleep apnea   . Depressed   . Low back pain   . Sciatica   . Gallstones   . Complication of anesthesia     heartrate dropped with anesthesia  . Diabetes mellitus without complication [facility])     Past Surgical History  Procedure Laterality Date  . Cholecystectomy  06/22/2012    Procedure: LAPAROSCOPIC CHOLECYSTECTOMY;  Surgeon: Jamesetta So, MD;  Location: [facility] ORS;  Service: General;  Laterality: N/A;  . Esophagogastroduodenoscopy N/A 06/09/2014    Procedure: ESOPHAGOGASTRODUODENOSCOPY (EGD);  Surgeon: Rogene Houston, MD;  Location: [facility] SU[facility];  Service: Endoscopy;  Laterality: N/A;  200-moved to 145 Ann to notify pt    Family History  Problem Relation Age of Onset  . Diabetes Mother   . Rheumatic fever Mother 49  . Migraines Father 77  . Hypertension Father   . Autism Brother   . Heart disease Neg Hx     Social History:  reports that she has never smoked. She does not have any smokeless tobacco history on file. She reports that she does not drink alcohol or use illicit drugs.    Review of Systems    No history of hypertension  Lipid history:  has not been on any medications.  No other risk factors except diabetes    Lab Results  Component Value Date   CHOL 165 06/07/2015   HDL 37.10* 06/07/2015   LDLCALC 111* 06/07/2015   TRIG 88.0 06/07/2015   CHOLHDL 4 06/07/2015           Eyes: Most recent eye exam was 10/15    Physical Examination:  BP 122/80 mmHg  Pulse 98  Ht 5\' 5"  (1.651 m)  Wt 288 lb (130.636 kg)  BMI 47.93 kg/m2  SpO2 98%     ASSESSMENT:  Diabetes type 2, uncontrolled with severe obesity    Her blood sugars are fairly good even though her weight has gone up significantly since her last visit   7  months ago  She has been taking her medications but not checking her blood sugars Diet has been consistent Although she complains of decreased appetite she does not have any nausea and has had weight gain  Hypocalcemia: Her calcium level was slightly low in the ER in April, no vitamin D levels available  PLAN:   Increase Jardiance to 25 mg  Restart glucose monitoring at various times at home  Continue with Victoza 1.2 mg  May need to reduce her glipizide or stop it if she started getting low normal blood sugars, she will call if needed  Recheck chemistry panel and also vitamin D    Patient Instructions  Check blood sugars on waking up 2  times a week Also check blood sugars about 2 hours after a meal and do this after different meals by rotation  Recommended blood sugar levels on waking up is 90-130 and about 2 hours after meal is 130-160  Please bring your blood sugar monitor to each visit, thank you  Walk daily  Call if sugars low    Sharlene Mccluskey 03/06/2016, 9:24 PM   Note: This office note was prepared with Dragon voice recognition system technology. Any transcriptional errors that result from this process are unintentional.

## 2016-03-07 LAB — COMPREHENSIVE METABOLIC PANEL
ALT: 17 U/L (ref 0–35)
AST: 14 U/L (ref 0–37)
Albumin: 3.7 g/dL (ref 3.5–5.2)
Alkaline Phosphatase: 50 U/L (ref 39–117)
BUN: 15 mg/dL (ref 6–23)
CALCIUM: 9.3 mg/dL (ref 8.4–10.5)
CHLORIDE: 104 meq/L (ref 96–112)
CO2: 28 meq/L (ref 19–32)
CREATININE: 0.99 mg/dL (ref 0.40–1.20)
GFR: 81.38 mL/min (ref >60.00)
Glucose, Bld: 135 mg/dL — ABNORMAL HIGH (ref 70–99)
Potassium: 3.8 mEq/L (ref 3.5–5.1)
Sodium: 140 mEq/L (ref 135–145)
Total Bilirubin: 0.3 mg/dL (ref 0.2–1.2)
Total Protein: 7.2 g/dL (ref 6.0–8.3)

## 2016-03-07 LAB — VITAMIN D 25 HYDROXY (VIT D DEFICIENCY, FRACTURES): VITD: 14.57 ng/mL — ABNORMAL LOW (ref 30.00–100.00)

## 2016-03-07 LAB — POCT GLYCOSYLATED HEMOGLOBIN (HGB A1C): HEMOGLOBIN A1C: 6.6

## 2016-03-07 NOTE — Progress Notes (Signed)
Quick Note:  Please let patient know that the kidney test/potassium okay. Vitamin D low, needs to start OTC 5000 units vitamin D3 daily ______

## 2016-03-22 ENCOUNTER — Telehealth: Payer: Self-pay | Admitting: Endocrinology

## 2016-03-22 MED ORDER — LIRAGLUTIDE 18 MG/3ML ~~LOC~~ SOPN: PEN_INJECTOR | SUBCUTANEOUS | 0 refills | Status: DC

## 2016-03-22 NOTE — Telephone Encounter (Signed)
PT stated she needs more Victoza called in because her dosage was increase.  [facility] in [place]

## 2016-03-22 NOTE — Telephone Encounter (Signed)
Rx submitted per pt's request.  

## 2016-04-05 ENCOUNTER — Encounter: Payer: BLUE CROSS/BLUE SHIELD | Admitting: Podiatry

## 2016-04-18 NOTE — Progress Notes (Signed)
This encounter was created in error - please disregard.

## 2016-04-22 ENCOUNTER — Other Ambulatory Visit: Payer: Self-pay | Admitting: Endocrinology

## 2016-04-23 NOTE — Telephone Encounter (Signed)
Patient need refill of [place], [place] [phone] (Phone) [phone] (Fax)

## 2016-05-27 ENCOUNTER — Telehealth: Payer: Self-pay | Admitting: Endocrinology

## 2016-05-27 ENCOUNTER — Other Ambulatory Visit: Payer: Self-pay | Admitting: *Deleted

## 2016-05-27 MED ORDER — LIRAGLUTIDE 18 MG/3ML ~~LOC~~ SOPN: PEN_INJECTOR | SUBCUTANEOUS | 3 refills | Status: DC

## 2016-05-27 MED ORDER — GLIPIZIDE ER 5 MG PO TB24: ORAL_TABLET | ORAL | 3 refills | Status: DC

## 2016-05-27 NOTE — Telephone Encounter (Signed)
Victoza is not going to be covered by her insurance at all, Colletta [place] spoke with [facility], the pharmacist is going to get with their insurance person and she is going to investigate what alternate is covered and send [place] a fax with what will be covered. I let the pt know so we can get everything taken care of for her and eliminate any confusion

## 2016-05-27 NOTE — Telephone Encounter (Signed)
Patient need PA on medication Victoza,Glipizide she is feeling tingling in her hand b/s 104.

## 2016-05-27 NOTE — Telephone Encounter (Signed)
Pt called and said that the pharmacy told her she needs her Victoza refill pre-authorized by [place].

## 2016-05-27 NOTE — Telephone Encounter (Signed)
Rxs sent

## 2016-05-28 NOTE — Telephone Encounter (Signed)
Forms were completed and faxed back

## 2016-05-30 ENCOUNTER — Telehealth: Payer: Self-pay | Admitting: Endocrinology

## 2016-05-30 NOTE — Telephone Encounter (Signed)
Julie Berry called back about the message that was left, she said to tell Julie Berry that she does not do the pre-certs there, you have to go online to [facility] and do it there for the Victoza sript.

## 2016-05-30 NOTE — Telephone Encounter (Signed)
Pt called and wanted to give you the number of the lady at the insurance company that she was just speaking with you about. [phone]

## 2016-06-29 ENCOUNTER — Other Ambulatory Visit: Payer: Self-pay | Admitting: Endocrinology

## 2016-07-05 ENCOUNTER — Other Ambulatory Visit: Payer: Self-pay

## 2016-07-05 ENCOUNTER — Telehealth: Payer: Self-pay | Admitting: Endocrinology

## 2016-07-05 MED ORDER — LIRAGLUTIDE 18 MG/3ML ~~LOC~~ SOPN: PEN_INJECTOR | SUBCUTANEOUS | 3 refills | Status: DC

## 2016-07-05 NOTE — Telephone Encounter (Signed)
_Patient need a refill of her medication Liraglutide (VICTOZA) 18 MG/3ML [place], [place] (Phone) [phone] (Fax)

## 2016-07-05 NOTE — Telephone Encounter (Signed)
Ordered 07/05/16

## 2016-07-19 ENCOUNTER — Ambulatory Visit (INDEPENDENT_AMBULATORY_CARE_PROVIDER_SITE_OTHER): Payer: PRIVATE HEALTH INSURANCE | Admitting: Endocrinology

## 2016-07-19 ENCOUNTER — Other Ambulatory Visit: Payer: Self-pay

## 2016-07-19 VITALS — BP 126/80 | Wt 286.0 lb

## 2016-07-19 DIAGNOSIS — E1165 Type 2 diabetes mellitus with hyperglycemia: Secondary | ICD-10-CM

## 2016-07-19 DIAGNOSIS — E782 Mixed hyperlipidemia: Secondary | ICD-10-CM | POA: Diagnosis not present

## 2016-07-19 DIAGNOSIS — E1136 Type 2 diabetes mellitus with diabetic cataract: Secondary | ICD-10-CM

## 2016-07-19 DIAGNOSIS — E559 Vitamin D deficiency, unspecified: Secondary | ICD-10-CM | POA: Diagnosis not present

## 2016-07-19 DIAGNOSIS — IMO0002 Reserved for concepts with insufficient information to code with codable children: Secondary | ICD-10-CM

## 2016-07-19 LAB — LIPID PANEL
CHOLESTEROL: 174 mg/dL (ref 0–200)
HDL: 32.8 mg/dL — AB (ref >39.00)
NonHDL: 141.04
TRIGLYCERIDES: 205 mg/dL — AB (ref 0.0–149.0)
Total CHOL/HDL Ratio: 5
VLDL: 41 mg/dL — ABNORMAL HIGH (ref 0.0–40.0)

## 2016-07-19 LAB — BASIC METABOLIC PANEL
BUN: 17 mg/dL (ref 6–23)
CALCIUM: 9.2 mg/dL (ref 8.4–10.5)
CHLORIDE: 104 meq/L (ref 96–112)
CO2: 29 meq/L (ref 19–32)
CREATININE: 0.86 mg/dL (ref 0.40–1.20)
GFR: 95.55 mL/min (ref >60.00)
GLUCOSE: 162 mg/dL — AB (ref 70–99)
Potassium: 3.7 mEq/L (ref 3.5–5.1)
Sodium: 140 mEq/L (ref 135–145)

## 2016-07-19 LAB — LDL CHOLESTEROL, DIRECT: LDL DIRECT: 112 mg/dL

## 2016-07-19 LAB — VITAMIN D 25 HYDROXY (VIT D DEFICIENCY, FRACTURES): VITD: 23.05 ng/mL — ABNORMAL LOW (ref 30.00–100.00)

## 2016-07-19 MED ORDER — EMPAGLIFLOZIN 10 MG PO TABS: 10.0000 mg | ORAL_TABLET | Freq: Every day | ORAL | 0 refills | Status: DC

## 2016-07-19 MED ORDER — LIRAGLUTIDE 18 MG/3ML ~~LOC~~ SOPN: PEN_INJECTOR | SUBCUTANEOUS | 3 refills | Status: DC

## 2016-07-19 NOTE — Patient Instructions (Addendum)
Check blood sugars on waking up 3x weekly  Also check blood sugars about 2 hours after a meal and do this after different meals by rotation  Recommended blood sugar levels on waking up is 90-130 and about 2 hours after meal is 130-160  Please bring your blood sugar monitor to each visit, thank you  Victoza 1.8mg  daily  Jardiance in am  Walk daily

## 2016-07-19 NOTE — Progress Notes (Signed)
Patient ID: Julie Berry, female   DOB: 1981/06/08, 35 y.o.   MRN: [id number]           Reason for Appointment: follow-upfor Type 2 Diabetes  Referring physician: Gerarda Fraction  History of Present Illness:          Date of diagnosis of type 2 diabetes mellitus:   11/2013       Background history:  She thinks she had blood sugars of about 300 when she was first diagnosed; although she was complaining of some fatigue she was diagnosed with routine labs on her annual visit.  Her records indicate she had an A1c of 6.3 in 12/2011 She was started on metformin twice a day but she could not tolerate it well because of diarrhea.  She thinks she tried to take it for about 6 months with some improvement in her blood sugars, subsequently stopped.  Because she had persistently high blood sugars even with starting glipizide she was started on a regimen of Victoza and Jardiance on her initial consultation in 03/2015; her average blood sugar prior to this was 250 with baseline A1c 10.6   Recent history:       Her A1c is now 6.6 but her blood sugars are much higher than expected She was last seen in July and has been erratic but follow-up  Current blood sugar patterns and problems identified:  She has stopped taking Jardiance which was helping her in the last visit, she claimed that this was causing vaginal discomfort, she did not try to treat this  Blood sugars at home are significantly high especially fasting but she is again checking readings sporadically  She continues to have difficulty losing any weight which had gone up significantly earlier this year  She continues to take Victoza 1.2 mg daily  She is still not consistent with diet  Oral hypoglycemic drugs the patient is taking are:   glipizide 5mg  er,  Jardiance 10 mg daily    Side effects from medications have been: Metfomin causes diarrhea.  Candidiasis once from Jardiance  Compliance with the medical regimen: Recently not good    Hypoglycemia: Never    Glucose monitoring:  done 7 times in the last month and her monitor has incorrect date and time   Glucometer: One Touch.      Blood Glucose readings :   FASTING recently 197-250 7 PM 241 and midnight 181 MEDIAN for 7 readings = 233  Self-care: The diet that the patient has been following is: tries to limit sodas, fast food, fried foods .     Typical meal intake: Breakfast is mostly  cereal.  Usually eating grilled chicken at lunch and not always consistent with diet               Dietician visit, most recent: 6/15               Exercise: None now  Weight history:  Wt Readings from Last 3 Encounters:  07/19/16 286 lb (129.7 kg)  03/06/16 288 lb (130.6 kg)  11/22/15 250 lb (113.4 kg)    Glycemic control:   Lab Results  Component Value Date   HGBA1C 6.6 07/22/2016   HGBA1C 6.6 03/07/2016   HGBA1C 6.1 08/17/2015   Lab Results  Component Value Date   MICROALBUR <0.7 06/07/2015   LDLCALC 111 (H) 06/07/2015   CREATININE 0.86 07/19/2016    Other problems discussed today: See review of systems     Medication List  Accurate as of 07/19/16 11:59 PM. Always use your most recent med list.          empagliflozin 10 MG Tabs tablet Commonly known as:  JARDIANCE Take 10 mg by mouth daily.   glipiZIDE 5 MG 24 hr tablet Commonly known as:  GLIPIZIDE XL TAKE 1 TABLET BY MOUTH DAILY WITH BREAKFAST.   liraglutide 18 MG/3ML Sopn Commonly known as:  VICTOZA INJECT 1.8MG  SUBCUTANEOUSLY ONCE DAILY AT THE SAME TIME.   ONE TOUCH ULTRA TEST test strip Generic drug:  glucose blood Use to check blood sugar 2 times daily   ONETOUCH DELICA LANCETS 99991111 Misc       Allergies:  Allergies  Allergen Reactions  . Amoxicillin Anaphylaxis  . Penicillins Anaphylaxis  . Dilaudid [Hydromorphone] Nausea And Vomiting and Other (See Comments)    States felt burning all over, "felt like i was coming out of the bed"  . Latex Rash    Past Medical History:   Diagnosis Date  . Complication of anesthesia    heartrate dropped with anesthesia  . Depressed   . Diabetes mellitus without complication ([place])   . Gallstones   . HTN (hypertension)   . Hyperglycemia   . Leukocytosis   . Low back pain   . Lump or mass in breast   . Malaise and fatigue   . Morbid obesity ([place])   . Sciatica   . Sleep apnea   . UTI (urinary tract infection)     Past Surgical History:  Procedure Laterality Date  . CHOLECYSTECTOMY  06/22/2012   Procedure: LAPAROSCOPIC CHOLECYSTECTOMY;  Surgeon: Jamesetta So, MD;  Location: [facility];  Service: General;  Laterality: N/A;  . ESOPHAGOGASTRODUODENOSCOPY N/A 06/09/2014   Procedure: ESOPHAGOGASTRODUODENOSCOPY (EGD);  Surgeon: Rogene Houston, MD;  Location: [facility];  Service: Endoscopy;  Laterality: N/A;  200-moved to 145 Ann to notify pt    Family History  Problem Relation Age of Onset  . Diabetes Mother   . Rheumatic fever Mother 42  . Migraines Father 59  . Hypertension Father   . Autism Brother   . Heart disease Neg Hx     Social History:  reports that she has never smoked. She does not have any smokeless tobacco history on file. She reports that she does not drink alcohol or use drugs.    Review of Systems    Lipid history:  has not been on any medications.  No other risk factors except diabetes    Lab Results  Component Value Date   CHOL 174 07/19/2016   HDL 32.80 (L) 07/19/2016   LDLCALC 111 (H) 06/07/2015   LDLDIRECT 112.0 07/19/2016   TRIG 205.0 (H) 07/19/2016   CHOLHDL 5 07/19/2016           Eyes: Most recent eye exam was 11/16  She is on 5000 units of Vitamin D3 because of low levels of vitamin D and also borderline calcium level   Physical Examination:  BP 126/80   Wt 286 lb (129.7 kg)   BMI 47.59 kg/m      ASSESSMENT:  Diabetes type 2, uncontrolled with severe obesity    See history of present illness for detailed discussion of current diabetes management, blood sugar  patterns and problems identified  Although her A1c is 6.6 her blood sugars averaging well over 200 at home and mostly higher fasting but has not checked enough readings She probably was doing better with Jardiance but she stopped it on her own  without notifying us because of some vaginal irritation, may have had more side effects with 25 mg compared to 10 mg Still has significant obesity which is not improving Also not motivated to exercise recently  LIPIDS: Need reassessment, LDL is above 100 previously  Vitamin D deficiency: She is taking 5000 units D3 and will need follow  PLAN:   Increase Victoza up to 1.8 mg call if not tolerated  Her glucose monitor was reset the correct date and time   Discussed blood sugar targets and need for weight loss, consider consultation with dietitian.  More regular follow-up  Jardiance 10 mg daily and she can use either Monistat or Diflucan as needed if she has any candidiasis, she can notified this time if she has problems   Start regular exercise  Recheck chemistry panel and also vitamin D  Low saturated fat diet    Patient Instructions  Check blood sugars on waking up 3x weekly  Also check blood sugars about 2 hours after a meal and do this after different meals by rotation  Recommended blood sugar levels on waking up is 90-130 and about 2 hours after meal is 130-160  Please bring your blood sugar monitor to each visit, thank you  Victoza 1.8mg  daily  Jardiance in am  Walk daily   Counseling time on subjects discussed above is over 50% of today's 25 minute visit    Labs: Vitamin D still low but improved LDL 112 Start pravastatin 40 mg daily  Office Visit on 07/19/2016  Component Date Value Ref Range Status  . Hemoglobin A1C 07/22/2016 6.6   Final  . Cholesterol 07/19/2016 174  0 - 200 mg/dL Final  . Triglycerides 07/19/2016 205.0* 0.0 - 149.0 mg/dL Final  . HDL 07/19/2016 32.80* >39.00 mg/dL Final  . VLDL 07/19/2016  41.0* 0.0 - 40.0 mg/dL Final  . Total CHOL/HDL Ratio 07/19/2016 5   Final  . NonHDL 07/19/2016 141.04   Final  . Sodium 07/19/2016 140  135 - 145 mEq/L Final  . Potassium 07/19/2016 3.7  3.5 - 5.1 mEq/L Final  . Chloride 07/19/2016 104  96 - 112 mEq/L Final  . CO2 07/19/2016 29  19 - 32 mEq/L Final  . Glucose, Bld 07/19/2016 162* 70 - 99 mg/dL Final  . BUN 07/19/2016 17  6 - 23 mg/dL Final  . Creatinine, Ser 07/19/2016 0.86  0.40 - 1.20 mg/dL Final  . Calcium 07/19/2016 9.2  8.4 - 10.5 mg/dL Final  . GFR 07/19/2016 95.55  >60.00 mL/min Final  . VITD 07/19/2016 23.05* 30.00 - 100.00 ng/mL Final  . Direct LDL 07/19/2016 112.0  mg/dL Final    Marquiz Sotelo 07/22/2016, 11:25 AM   Note: This office note was prepared with Estate agent. Any transcriptional errors that result from this process are unintentional.

## 2016-07-22 LAB — POCT GLYCOSYLATED HEMOGLOBIN (HGB A1C): HEMOGLOBIN A1C: 6.6

## 2016-07-23 ENCOUNTER — Other Ambulatory Visit: Payer: Self-pay

## 2016-07-23 MED ORDER — PRAVASTATIN SODIUM 40 MG PO TABS: 40.0000 mg | ORAL_TABLET | Freq: Every day | ORAL | 3 refills | Status: DC

## 2016-07-27 ENCOUNTER — Emergency Department (HOSPITAL_COMMUNITY)
Admission: EM | Admit: 2016-07-27 | Discharge: 2016-07-27 | Disposition: A | Discharge status: Home / Self Care | Payer: PRIVATE HEALTH INSURANCE | Attending: Emergency Medicine | Admitting: Emergency Medicine

## 2016-07-27 ENCOUNTER — Encounter (HOSPITAL_COMMUNITY): Payer: Self-pay

## 2016-07-27 DIAGNOSIS — R112 Nausea with vomiting, unspecified: Secondary | ICD-10-CM | POA: Diagnosis not present

## 2016-07-27 DIAGNOSIS — M542 Cervicalgia: Secondary | ICD-10-CM | POA: Insufficient documentation

## 2016-07-27 DIAGNOSIS — R531 Weakness: Secondary | ICD-10-CM | POA: Diagnosis not present

## 2016-07-27 DIAGNOSIS — Z7984 Long term (current) use of oral hypoglycemic drugs: Secondary | ICD-10-CM | POA: Insufficient documentation

## 2016-07-27 DIAGNOSIS — I10 Essential (primary) hypertension: Secondary | ICD-10-CM | POA: Insufficient documentation

## 2016-07-27 DIAGNOSIS — R1110 Vomiting, unspecified: Secondary | ICD-10-CM

## 2016-07-27 DIAGNOSIS — R5383 Other fatigue: Secondary | ICD-10-CM | POA: Diagnosis not present

## 2016-07-27 DIAGNOSIS — R197 Diarrhea, unspecified: Secondary | ICD-10-CM | POA: Insufficient documentation

## 2016-07-27 DIAGNOSIS — Z91040 Latex allergy status: Secondary | ICD-10-CM | POA: Insufficient documentation

## 2016-07-27 DIAGNOSIS — Z79899 Other long term (current) drug therapy: Secondary | ICD-10-CM | POA: Diagnosis not present

## 2016-07-27 DIAGNOSIS — M791 Myalgia, unspecified site: Secondary | ICD-10-CM

## 2016-07-27 DIAGNOSIS — M545 Low back pain: Secondary | ICD-10-CM | POA: Insufficient documentation

## 2016-07-27 DIAGNOSIS — R42 Dizziness and giddiness: Secondary | ICD-10-CM | POA: Diagnosis present

## 2016-07-27 DIAGNOSIS — E119 Type 2 diabetes mellitus without complications: Secondary | ICD-10-CM | POA: Insufficient documentation

## 2016-07-27 LAB — COMPREHENSIVE METABOLIC PANEL
ALBUMIN: 3.7 g/dL (ref 3.5–5.0)
ALT: 19 U/L (ref 14–54)
ANION GAP: 9 (ref 5–15)
AST: 20 U/L (ref 15–41)
Alkaline Phosphatase: 55 U/L (ref 38–126)
BILIRUBIN TOTAL: 0.7 mg/dL (ref 0.3–1.2)
BUN: 17 mg/dL (ref 6–20)
CALCIUM: 8.8 mg/dL — AB (ref 8.9–10.3)
CO2: 24 mmol/L (ref 22–32)
Chloride: 102 mmol/L (ref 101–111)
Creatinine, Ser: 0.92 mg/dL (ref 0.44–1.00)
GFR calc Af Amer: >60 mL/min (ref >60)
GLUCOSE: 164 mg/dL — AB (ref 65–99)
POTASSIUM: 3.3 mmol/L — AB (ref 3.5–5.1)
Sodium: 135 mmol/L (ref 135–145)
TOTAL PROTEIN: 7.7 g/dL (ref 6.5–8.1)

## 2016-07-27 LAB — CBC WITH DIFFERENTIAL/PLATELET
BASOS PCT: 0 %
Basophils Absolute: 0 10*3/uL (ref 0.0–0.1)
Eosinophils Absolute: 0 10*3/uL (ref 0.0–0.7)
Eosinophils Relative: 0 %
HEMATOCRIT: 41.1 % (ref 36.0–46.0)
HEMOGLOBIN: 13.3 g/dL (ref 12.0–15.0)
LYMPHS ABS: 0.3 10*3/uL — AB (ref 0.7–4.0)
LYMPHS PCT: 3 %
MCH: 27 pg (ref 26.0–34.0)
MCHC: 32.4 g/dL (ref 30.0–36.0)
MCV: 83.4 fL (ref 78.0–100.0)
MONO ABS: 0.4 10*3/uL (ref 0.1–1.0)
MONOS PCT: 4 %
NEUTROS ABS: 10 10*3/uL — AB (ref 1.7–7.7)
NEUTROS PCT: 93 %
Platelets: 254 10*3/uL (ref 150–400)
RBC: 4.93 MIL/uL (ref 3.87–5.11)
RDW: 14.4 % (ref 11.5–15.5)
WBC: 10.8 10*3/uL — ABNORMAL HIGH (ref 4.0–10.5)

## 2016-07-27 LAB — URINALYSIS, ROUTINE W REFLEX MICROSCOPIC
Bilirubin Urine: NEGATIVE
GLUCOSE, UA: NEGATIVE mg/dL
Hgb urine dipstick: NEGATIVE
KETONES UR: 5 mg/dL — AB
LEUKOCYTES UA: NEGATIVE
NITRITE: NEGATIVE
PH: 5 (ref 5.0–8.0)
PROTEIN: NEGATIVE mg/dL
Specific Gravity, Urine: 1.023 (ref 1.005–1.030)

## 2016-07-27 LAB — PREGNANCY, URINE: PREG TEST UR: NEGATIVE

## 2016-07-27 LAB — CK: Total CK: 200 U/L (ref 38–234)

## 2016-07-27 MED ORDER — METHOCARBAMOL 500 MG PO TABS
1000.0000 mg | ORAL_TABLET | Freq: Once | ORAL | Status: AC
  Administered 2016-07-27: 1000 mg via ORAL
  Filled 2016-07-27: qty 2

## 2016-07-27 MED ORDER — SODIUM CHLORIDE 0.9 % IV BOLUS (SEPSIS)
2000.0000 mL | Freq: Once | INTRAVENOUS | Status: AC
  Administered 2016-07-27: 2000 mL via INTRAVENOUS

## 2016-07-27 MED ORDER — ONDANSETRON HCL 4 MG PO TABS: 4.0000 mg | ORAL_TABLET | Freq: Three times a day (TID) | ORAL | 0 refills | Status: DC | PRN

## 2016-07-27 MED ORDER — ONDANSETRON HCL 4 MG/2ML IJ SOLN
INTRAMUSCULAR | Status: AC
  Filled 2016-07-27: qty 2

## 2016-07-27 MED ORDER — ONDANSETRON HCL 4 MG/2ML IJ SOLN
4.0000 mg | Freq: Once | INTRAMUSCULAR | Status: AC
  Administered 2016-07-27: 4 mg via INTRAVENOUS

## 2016-07-27 MED ORDER — IBUPROFEN 600 MG PO TABS: 600.0000 mg | ORAL_TABLET | Freq: Four times a day (QID) | ORAL | 0 refills | Status: DC | PRN

## 2016-07-27 MED ORDER — METHOCARBAMOL 500 MG PO TABS: 1000.0000 mg | ORAL_TABLET | Freq: Three times a day (TID) | ORAL | 0 refills | Status: DC | PRN

## 2016-07-27 MED ORDER — KETOROLAC TROMETHAMINE 30 MG/ML IJ SOLN
30.0000 mg | Freq: Once | INTRAMUSCULAR | Status: AC
  Administered 2016-07-27: 30 mg via INTRAVENOUS
  Filled 2016-07-27: qty 1

## 2016-07-27 NOTE — ED Notes (Signed)
Ice chips given to patient.

## 2016-07-27 NOTE — Discharge Instructions (Signed)
Stop taking pravastatin and follow-up with your primary physician. Routine immediately for any worsening symptoms, persistent vomiting, worsening pain or for any concerns.

## 2016-07-27 NOTE — ED Notes (Signed)
Patient requesting something for nausea. Md made aware.

## 2016-07-27 NOTE — ED Triage Notes (Addendum)
Patient here from home by [facility] for fatigue. States she had vomited several times this morning. Increased dose of Victoza from 1.2 to 1.8. States she has felt bad since medication was increased on Friday.  Per EMS patient began dizzy when she stood up. Patient complains of neck pain and bilateral leg heaviness.
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
## 2016-09-02 ENCOUNTER — Ambulatory Visit (INDEPENDENT_AMBULATORY_CARE_PROVIDER_SITE_OTHER): Payer: PRIVATE HEALTH INSURANCE | Admitting: Endocrinology

## 2016-09-02 ENCOUNTER — Encounter: Payer: Self-pay | Admitting: Endocrinology

## 2016-09-02 VITALS — BP 130/88 | HR 84 | Ht 66.0 in | Wt 282.0 lb

## 2016-09-02 DIAGNOSIS — E1165 Type 2 diabetes mellitus with hyperglycemia: Secondary | ICD-10-CM

## 2016-09-02 LAB — COMPREHENSIVE METABOLIC PANEL
ALK PHOS: 51 U/L (ref 39–117)
ALT: 15 U/L (ref 0–35)
AST: 15 U/L (ref 0–37)
Albumin: 3.8 g/dL (ref 3.5–5.2)
BUN: 16 mg/dL (ref 6–23)
CHLORIDE: 104 meq/L (ref 96–112)
CO2: 27 mEq/L (ref 19–32)
Calcium: 9.7 mg/dL (ref 8.4–10.5)
Creatinine, Ser: 0.9 mg/dL (ref 0.40–1.20)
GFR: 90.6 mL/min (ref >60.00)
Glucose, Bld: 123 mg/dL — ABNORMAL HIGH (ref 70–99)
POTASSIUM: 3.6 meq/L (ref 3.5–5.1)
SODIUM: 139 meq/L (ref 135–145)
TOTAL PROTEIN: 7.3 g/dL (ref 6.0–8.3)
Total Bilirubin: 0.3 mg/dL (ref 0.2–1.2)

## 2016-09-02 LAB — LIPID PANEL
Cholesterol: 174 mg/dL (ref 0–200)
HDL: 35.4 mg/dL — AB (ref >39.00)
LDL CALC: 115 mg/dL — AB (ref 0–99)
NonHDL: 138.83
TRIGLYCERIDES: 121 mg/dL (ref 0.0–149.0)
Total CHOL/HDL Ratio: 5
VLDL: 24.2 mg/dL (ref 0.0–40.0)

## 2016-09-02 MED ORDER — INSULIN DEGLUDEC 100 UNIT/ML ~~LOC~~ SOPN: 15.0000 [IU] | PEN_INJECTOR | Freq: Every day | SUBCUTANEOUS | 1 refills | Status: DC

## 2016-09-02 NOTE — Progress Notes (Signed)
Patient ID: Julie Berry, female   DOB: 1981-07-12, 36 y.o.   MRN: [id number]           Reason for Appointment: follow-upfor Type 2 Diabetes  Referring physician: Gerarda Fraction  History of Present Illness:          Date of diagnosis of type 2 diabetes mellitus:   11/2013       Background history:  She thinks she had blood sugars of about 300 when she was first diagnosed; although she was complaining of some fatigue she was diagnosed with routine labs on her annual visit.  Her records indicate she had an A1c of 6.3 in 12/2011 She was started on metformin twice a day but she could not tolerate it well because of diarrhea.  She thinks she tried to take it for about 6 months with some improvement in her blood sugars, subsequently stopped.  Because she had persistently high blood sugars even with starting glipizide she was started on a regimen of Victoza and Jardiance on her initial consultation in 03/2015; her average blood sugar prior to this was 250 with baseline A1c 10.6   Recent history:       Her A1c is  6.6 but her blood sugars are much higher than expected  Current blood sugar patterns and problems identified:  She was restarted on Jardiance and she took it for about a month until about a week ago when she ran out, her insurance not covering this now  Although she had increased her Victoza on her last visit her blood sugars are not improving  She says that she is eating significantly less but her decreased appetite started after her acute illness in December  Overall blood sugars are improving  However she has significantly high fasting readings in currently is taking only glipizide 5 mg  She has started exercising Checking blood sugar mostly in the morning  Oral hypoglycemic drugs the patient is taking are:   glipizide 5mg  er,  Jardiance 10 mg daily    Side effects from medications have been: Metfomin causes diarrhea.  Candidiasis once from Jardiance  Compliance with the medical  regimen: Recently improving Hypoglycemia: Never    Glucose monitoring:     Glucometer: One Touch.      Blood Glucose readings :   Mean values apply above for all meters except median for One Touch  PRE-MEAL Fasting Lunch Dinner Bedtime Overall  Glucose range: 151-228  186   1 50-213    Mean/median: 172    195  172     Self-care: The diet that the patient has been following is: tries to limit sodas, fast food, fried foods .     Typical meal intake: Breakfast is mostly  cereal.  Usually eating grilled chicken at lunch and not always consistent with diet               Dietician visit, most recent: 6/15               Exercise: Just starting to exercise at the [facility]   Weight history:  Wt Readings from Last 3 Encounters:  09/02/16 282 lb (127.9 kg)  07/27/16 285 lb (129.3 kg)  07/19/16 286 lb (129.7 kg)    Glycemic control:   Lab Results  Component Value Date   HGBA1C 6.6 07/22/2016   HGBA1C 6.6 03/07/2016   HGBA1C 6.1 08/17/2015   Lab Results  Component Value Date   MICROALBUR <0.7 06/07/2015   LDLCALC 111 (H)  06/07/2015   CREATININE 0.92 07/27/2016    Other problems discussed today: See review of systems   Allergies as of 09/02/2016      Reactions   Amoxicillin Anaphylaxis, Other (See Comments)   Has patient had a PCN reaction causing immediate rash, facial/tongue/throat swelling, SOB or lightheadedness with hypotension: Yes Has patient had a PCN reaction causing severe rash involving mucus membranes or skin necrosis: No Has patient had a PCN reaction that required hospitalization No Has patient had a PCN reaction occurring within the last 10 years: No If all of the above answers are "NO", then may proceed with Cephalosporin use.   Penicillins Anaphylaxis, Other (See Comments)   Has patient had a PCN reaction causing immediate rash, facial/tongue/throat swelling, SOB or lightheadedness with hypotension: Yes Has patient had a PCN reaction causing severe rash  involving mucus membranes or skin necrosis: No Has patient had a PCN reaction that required hospitalization No Has patient had a PCN reaction occurring within the last 10 years: No If all of the above answers are "NO", then may proceed with Cephalosporin use.   Dilaudid [hydromorphone] Nausea And Vomiting, Other (See Comments)   Reaction:  Burning    Latex Rash      Medication List       Accurate as of 09/02/16  4:14 PM. Always use your most recent med list.          empagliflozin 10 MG Tabs tablet Commonly known as:  JARDIANCE Take 10 mg by mouth daily.   glipiZIDE 5 MG 24 hr tablet Commonly known as:  GLUCOTROL XL Take 5 mg by mouth daily with breakfast.   ibuprofen 600 MG tablet Commonly known as:  ADVIL,MOTRIN Take 1 tablet (600 mg total) by mouth every 6 (six) hours as needed.   insulin degludec 100 UNIT/ML Sopn FlexTouch Pen Commonly known as:  TRESIBA FLEXTOUCH Inject 0.15 mLs (15 Units total) into the skin daily.   methocarbamol 500 MG tablet Commonly known as:  ROBAXIN Take 2 tablets (1,000 mg total) by mouth every 8 (eight) hours as needed for muscle spasms.   pravastatin 40 MG tablet Commonly known as:  PRAVACHOL Take 1 tablet (40 mg total) by mouth daily.   VICTOZA 18 MG/3ML Sopn Generic drug:  liraglutide Inject 1.8 mg into the skin at bedtime.   Vitamin D3 5000 units Caps Take 5,000 Units by mouth daily.       Allergies:  Allergies  Allergen Reactions  . Amoxicillin Anaphylaxis and Other (See Comments)    Has patient had a PCN reaction causing immediate rash, facial/tongue/throat swelling, SOB or lightheadedness with hypotension: Yes Has patient had a PCN reaction causing severe rash involving mucus membranes or skin necrosis: No Has patient had a PCN reaction that required hospitalization No Has patient had a PCN reaction occurring within the last 10 years: No If all of the above answers are "NO", then may proceed with Cephalosporin use.  Marland Kitchen  Penicillins Anaphylaxis and Other (See Comments)    Has patient had a PCN reaction causing immediate rash, facial/tongue/throat swelling, SOB or lightheadedness with hypotension: Yes Has patient had a PCN reaction causing severe rash involving mucus membranes or skin necrosis: No Has patient had a PCN reaction that required hospitalization No Has patient had a PCN reaction occurring within the last 10 years: No If all of the above answers are "NO", then may proceed with Cephalosporin use.  . Dilaudid [Hydromorphone] Nausea And Vomiting and Other (See Comments)  Reaction:  Burning   . Latex Rash    Past Medical History:  Diagnosis Date  . Complication of anesthesia    heartrate dropped with anesthesia  . Depressed   . Diabetes mellitus without complication ([place])   . Gallstones   . HTN (hypertension)   . Hyperglycemia   . Leukocytosis   . Low back pain   . Lump or mass in breast   . Malaise and fatigue   . Morbid obesity ([place])   . Sciatica   . Sleep apnea   . UTI (urinary tract infection)     Past Surgical History:  Procedure Laterality Date  . CHOLECYSTECTOMY  06/22/2012   Procedure: LAPAROSCOPIC CHOLECYSTECTOMY;  Surgeon: Jamesetta So, MD;  Location: [facility];  Service: General;  Laterality: N/A;  . ESOPHAGOGASTRODUODENOSCOPY N/A 06/09/2014   Procedure: ESOPHAGOGASTRODUODENOSCOPY (EGD);  Surgeon: Rogene Houston, MD;  Location: [facility];  Service: Endoscopy;  Laterality: N/A;  200-moved to 145 Ann to notify pt    Family History  Problem Relation Age of Onset  . Diabetes Mother   . Rheumatic fever Mother 67  . Migraines Father 46  . Hypertension Father   . Autism Brother   . Heart disease Neg Hx     Social History:  reports that she has never smoked. She has never used smokeless tobacco. She reports that she does not drink alcohol or use drugs.    Review of Systems    Lipid history:  has Been prescribed pravastatin and she thinks she has taken it since last  month    Lab Results  Component Value Date   CHOL 174 07/19/2016   HDL 32.80 (L) 07/19/2016   LDLCALC 111 (H) 06/07/2015   LDLDIRECT 112.0 07/19/2016   TRIG 205.0 (H) 07/19/2016   CHOLHDL 5 07/19/2016           Eyes: Most recent eye exam was 11/16  She is on 5000 units of Vitamin D3  Has had borderline low calcium level  Lab Results  Component Value Date   VD25OH 23.05 (L) 07/19/2016   VD25OH 14.57 (L) 03/06/2016   Lab Results  Component Value Date   CALCIUM 8.8 (L) 07/27/2016      Physical Examination:  BP 130/88   Pulse 84   Ht 5\' 6"  (1.676 m)   Wt 282 lb (127.9 kg)   SpO2 97%   BMI 45.52 kg/m   No ankle edema    ASSESSMENT:  Diabetes type 2, uncontrolled with severe obesity    See history of present illness for detailed discussion of current diabetes management, blood sugar patterns and problems identified  Blood sugars are only slightly better with restarting Jardiance in increasing Victoza to 1.8 Even with her small portions and started some exercise blood sugars are still averaging about 170 blood morning and occasionally higher in the evening She is likely insulin deficient Jardiance 10 mg has been tolerated so far but needs prior authorization now  LIPIDS: Need reassessment, LDL is above 100 previously and has started pravastatin according to patient last month  Vitamin D deficiency: She is taking 5000 units D3 and will need follow up on the next visit  ?  Hypertension: We will continue to follow blood pressure, may improve with Jardiance also   She had hypokalemia last month the ER he will need follow-up   PLAN:   Tresiba 6 units daily at night, showed her how to use the FlexPen  Discussed use of basal  insulin, timing injection, duration of action, potential for hypoglycemia, dosage adjustment, storage  Use a flowsheet provided and instructions that were explained to titrate the dose every 3 days by 2 units to get morning sugars at least  under 130  Restart Jardiance when available through insurance PA  Continue Victoza 1.8  More consistent monitoring especially after meals  Follow-up in 6 weeks    Patient Instructions   Tresiba insulin: This insulin provides blood sugar control for up to 24 hours.   Start with 10 units at bedtime daily and increase by 2 units every 3 days until the waking up sugars are under 130. Then continue the same dose.  If blood sugar is under 90 for 2 days in a row, reduce the dose by 2 units.  Note that this insulin does not control the rise of blood sugar with meals    Check blood sugars on waking up  daily   Also check blood sugars about 2 hours after a meal and do this after different meals by rotation  Recommended blood sugar levels on waking up is 90-130 and about 2 hours after meal is 130-160  Please bring your blood sugar monitor to each visit, thank you   Counseling time on subjects discussed above is over 50% of today's 25 minute visit    Labs: Vitamin D still low but improved LDL 112 Start pravastatin 40 mg daily    Tc Kapusta 09/02/2016, 4:14 PM   Note: This office note was prepared with Estate agent. Any transcriptional errors that result from this process are unintentional.

## 2016-09-02 NOTE — Patient Instructions (Signed)
Julie Berry insulin: This insulin provides blood sugar control for up to 24 hours.   Start with 10 units at bedtime daily and increase by 2 units every 3 days until the waking up sugars are under 130. Then continue the same dose.  If blood sugar is under 90 for 2 days in a row, reduce the dose by 2 units.  Note that this insulin does not control the rise of blood sugar with meals    Check blood sugars on waking up  daily   Also check blood sugars about 2 hours after a meal and do this after different meals by rotation  Recommended blood sugar levels on waking up is 90-130 and about 2 hours after meal is 130-160  Please bring your blood sugar monitor to each visit, thank you

## 2016-09-09 ENCOUNTER — Telehealth: Payer: Self-pay | Admitting: Endocrinology

## 2016-09-27 ENCOUNTER — Telehealth: Payer: Self-pay | Admitting: Endocrinology

## 2016-09-27 NOTE — Telephone Encounter (Signed)
[employer] called about the status of PA request they sent over for the Jardiance. Any questions, you may call [phone]

## 2016-09-30 NOTE — Telephone Encounter (Signed)
PA has been received via fax, in progress

## 2016-10-10 ENCOUNTER — Telehealth: Payer: Self-pay | Admitting: Endocrinology

## 2016-10-11 NOTE — Telephone Encounter (Signed)
PA has been approved patient was made aware

## 2016-10-11 NOTE — Telephone Encounter (Signed)
[employer] is calling on the status of medical release sent to [facility].  [phone]  Ex [id number]  extension is the case # she said

## 2016-10-14 ENCOUNTER — Ambulatory Visit: Payer: PRIVATE HEALTH INSURANCE | Admitting: Endocrinology

## 2016-10-30 ENCOUNTER — Ambulatory Visit: Payer: PRIVATE HEALTH INSURANCE | Admitting: Endocrinology

## 2016-11-28 ENCOUNTER — Ambulatory Visit (INDEPENDENT_AMBULATORY_CARE_PROVIDER_SITE_OTHER): Payer: PRIVATE HEALTH INSURANCE | Admitting: Endocrinology

## 2016-11-28 ENCOUNTER — Encounter: Payer: Self-pay | Admitting: Endocrinology

## 2016-11-28 VITALS — BP 120/72 | HR 68 | Ht 66.0 in | Wt 289.0 lb

## 2016-11-28 DIAGNOSIS — R5383 Other fatigue: Secondary | ICD-10-CM | POA: Diagnosis not present

## 2016-11-28 DIAGNOSIS — R252 Cramp and spasm: Secondary | ICD-10-CM | POA: Diagnosis not present

## 2016-11-28 DIAGNOSIS — E1165 Type 2 diabetes mellitus with hyperglycemia: Secondary | ICD-10-CM | POA: Diagnosis not present

## 2016-11-28 LAB — CBC WITH DIFFERENTIAL/PLATELET
BASOS PCT: 0.6 % (ref 0.0–3.0)
Basophils Absolute: 0.1 10*3/uL (ref 0.0–0.1)
EOS PCT: 2 % (ref 0.0–5.0)
Eosinophils Absolute: 0.2 10*3/uL (ref 0.0–0.7)
HCT: 40.4 % (ref 36.0–46.0)
HEMOGLOBIN: 13 g/dL (ref 12.0–15.0)
LYMPHS ABS: 1.7 10*3/uL (ref 0.7–4.0)
Lymphocytes Relative: 18.7 % (ref 12.0–46.0)
MCHC: 32.2 g/dL (ref 30.0–36.0)
MCV: 81.8 fl (ref 78.0–100.0)
MONOS PCT: 6.4 % (ref 3.0–12.0)
Monocytes Absolute: 0.6 10*3/uL (ref 0.1–1.0)
Neutro Abs: 6.6 10*3/uL (ref 1.4–7.7)
Neutrophils Relative %: 72.3 % (ref 43.0–77.0)
Platelets: 289 10*3/uL (ref 150.0–400.0)
RBC: 4.94 Mil/uL (ref 3.87–5.11)
RDW: 14.1 % (ref 11.5–15.5)
WBC: 9.1 10*3/uL (ref 4.0–10.5)

## 2016-11-28 LAB — MAGNESIUM: Magnesium: 1.8 mg/dL (ref 1.5–2.5)

## 2016-11-28 LAB — BASIC METABOLIC PANEL
BUN: 14 mg/dL (ref 6–23)
CALCIUM: 9 mg/dL (ref 8.4–10.5)
CO2: 28 meq/L (ref 19–32)
CREATININE: 0.88 mg/dL (ref 0.40–1.20)
Chloride: 104 mEq/L (ref 96–112)
GFR: 92.86 mL/min (ref >60.00)
Glucose, Bld: 162 mg/dL — ABNORMAL HIGH (ref 70–99)
Potassium: 3.8 mEq/L (ref 3.5–5.1)
SODIUM: 138 meq/L (ref 135–145)

## 2016-11-28 LAB — POCT GLYCOSYLATED HEMOGLOBIN (HGB A1C): Hemoglobin A1C: 7.5

## 2016-11-28 LAB — MICROALBUMIN / CREATININE URINE RATIO
CREATININE, U: 175.9 mg/dL
Microalb Creat Ratio: 0.4 mg/g (ref 0.0–30.0)
Microalb, Ur: <0.7 mg/dL (ref 0.0–1.9)

## 2016-11-28 LAB — TSH: TSH: 1.84 u[IU]/mL (ref 0.35–4.50)

## 2016-11-28 LAB — T4, FREE: FREE T4: 0.83 ng/dL (ref 0.60–1.60)

## 2016-11-28 MED ORDER — SITAGLIP PHOS-METFORMIN HCL ER 50-500 MG PO TB24: ORAL_TABLET | ORAL | 2 refills | Status: DC

## 2016-11-28 NOTE — Progress Notes (Signed)
Please let patient know that the lab results are all normal and she needs to follow-up with PCP

## 2016-11-28 NOTE — Progress Notes (Signed)
Patient ID: Julie Berry, female   DOB: 09-14-1980, 36 y.o.   MRN: [id number]           Reason for Appointment: follow-upfor Type 2 Diabetes  Referring physician: Gerarda Fraction  History of Present Illness:          Date of diagnosis of type 2 diabetes mellitus:   11/2013       Background history:  She thinks she had blood sugars of about 300 when she was first diagnosed; although she was complaining of some fatigue she was diagnosed with routine labs on her annual visit.  Her records indicate she had an A1c of 6.3 in 12/2011 She was started on metformin twice a day but she could not tolerate it well because of diarrhea.  She thinks she tried to take it for about 6 months with some improvement in her blood sugars, subsequently stopped.  Because she had persistently high blood sugars even with starting glipizide she was started on a regimen of Victoza and Jardiance on her initial consultation in 03/2015; her average blood sugar prior to this was 250 with baseline A1c 10.6   Recent history:       Her A1c is  7.5, previously 6.6   INSULIN regimen: Tresiba 15 units daily  Non-insulin hypoglycemic regimen: Victoza 1.8 mg daily, glipizide ER 5 mg daily  Current blood sugar patterns from her glucose monitor download, current management and problems identified:  She was  started on Tresiba in January and she did not come back for follow-up  Although she was told to try Vania Rea again says she had persistent candidiasis and did not continue  She has increased her Tyler Aas a few days ago from the 12 units she was taking, and initially was started on 6 units and told to titrate this.  Her blood sugars are mostly higher in the morning although she is checking sporadically in the afternoon and evening   She has taking Victoza fairly consistently  However has gained some weight   Oral hypoglycemic drugs the patient is taking are:    Side effects from medications have been: Metfomin causes  diarrhea.  Candidiasis once from Jardiance  Compliance with the medical regimen: Recently improving Hypoglycemia: Never    Glucose monitoring:   Done about once a day    Glucometer: One Touch.      Blood Glucose readings :   Mean values apply above for all meters except median for One Touch  PRE-MEAL Fasting Lunch Dinner Bedtime Overall  Glucose range:  1 21-241       Mean/median: 186     171    POST-MEAL PC Breakfast PC Lunch PC Dinner  Glucose range:   132-187   Mean/median:   171    Self-care: The diet that the patient has been following is: tries to limit sodas, fast food, fried foods .     Typical meal intake: Breakfast is mostly  cereal.  Usually eating grilled chicken at lunch and not always consistent with diet               Dietician visit, most recent: 6/15               Exercise: Just Started walking 30 min 3/7  Weight history:  Wt Readings from Last 3 Encounters:  11/28/16 289 lb (131.1 kg)  09/02/16 282 lb (127.9 kg)  07/27/16 285 lb (129.3 kg)    Glycemic control:   Lab Results  Component Value Date  HGBA1C 7.5 11/28/2016   HGBA1C 6.6 07/22/2016   HGBA1C 6.6 03/07/2016   Lab Results  Component Value Date   MICROALBUR <0.7 11/28/2016   LDLCALC 115 (H) 09/02/2016   CREATININE 0.88 11/28/2016    Other problems discussed today: See review of systems   Allergies as of 11/28/2016      Reactions   Amoxicillin Anaphylaxis, Other (See Comments)   Has patient had a PCN reaction causing immediate rash, facial/tongue/throat swelling, SOB or lightheadedness with hypotension: Yes Has patient had a PCN reaction causing severe rash involving mucus membranes or skin necrosis: No Has patient had a PCN reaction that required hospitalization No Has patient had a PCN reaction occurring within the last 10 years: No If all of the above answers are "NO", then may proceed with Cephalosporin use.   Penicillins Anaphylaxis, Other (See Comments)   Has patient had a PCN  reaction causing immediate rash, facial/tongue/throat swelling, SOB or lightheadedness with hypotension: Yes Has patient had a PCN reaction causing severe rash involving mucus membranes or skin necrosis: No Has patient had a PCN reaction that required hospitalization No Has patient had a PCN reaction occurring within the last 10 years: No If all of the above answers are "NO", then may proceed with Cephalosporin use.   Dilaudid [hydromorphone] Nausea And Vomiting, Other (See Comments)   Reaction:  Burning    Latex Rash      Medication List       Accurate as of 11/28/16  9:20 PM. Always use your most recent med list.          ibuprofen 600 MG tablet Commonly known as:  ADVIL,MOTRIN Take 1 tablet (600 mg total) by mouth every 6 (six) hours as needed.   insulin degludec 100 UNIT/ML Sopn FlexTouch Pen Commonly known as:  TRESIBA FLEXTOUCH Inject 0.15 mLs (15 Units total) into the skin daily.   methocarbamol 500 MG tablet Commonly known as:  ROBAXIN Take 2 tablets (1,000 mg total) by mouth every 8 (eight) hours as needed for muscle spasms.   pravastatin 40 MG tablet Commonly known as:  PRAVACHOL Take 1 tablet (40 mg total) by mouth daily.   SitaGLIPtin-MetFORMIN HCl 50-500 MG Tb24 Commonly known as:  JANUMET XR 2 tabs qd   VICTOZA 18 MG/3ML Sopn Generic drug:  liraglutide Inject 1.8 mg into the skin at bedtime.   Vitamin D3 5000 units Caps Take 5,000 Units by mouth daily.       Allergies:  Allergies  Allergen Reactions  . Amoxicillin Anaphylaxis and Other (See Comments)    Has patient had a PCN reaction causing immediate rash, facial/tongue/throat swelling, SOB or lightheadedness with hypotension: Yes Has patient had a PCN reaction causing severe rash involving mucus membranes or skin necrosis: No Has patient had a PCN reaction that required hospitalization No Has patient had a PCN reaction occurring within the last 10 years: No If all of the above answers are "NO",  then may proceed with Cephalosporin use.  Marland Kitchen Penicillins Anaphylaxis and Other (See Comments)    Has patient had a PCN reaction causing immediate rash, facial/tongue/throat swelling, SOB or lightheadedness with hypotension: Yes Has patient had a PCN reaction causing severe rash involving mucus membranes or skin necrosis: No Has patient had a PCN reaction that required hospitalization No Has patient had a PCN reaction occurring within the last 10 years: No If all of the above answers are "NO", then may proceed with Cephalosporin use.  . Dilaudid [Hydromorphone] Nausea And Vomiting  and Other (See Comments)    Reaction:  Burning   . Latex Rash    Past Medical History:  Diagnosis Date  . Complication of anesthesia    heartrate dropped with anesthesia  . Depressed   . Diabetes mellitus without complication ([place])   . Gallstones   . HTN (hypertension)   . Hyperglycemia   . Leukocytosis   . Low back pain   . Lump or mass in breast   . Malaise and fatigue   . Morbid obesity ([place])   . Sciatica   . Sleep apnea   . UTI (urinary tract infection)     Past Surgical History:  Procedure Laterality Date  . CHOLECYSTECTOMY  06/22/2012   Procedure: LAPAROSCOPIC CHOLECYSTECTOMY;  Surgeon: Jamesetta So, MD;  Location: [facility];  Service: General;  Laterality: N/A;  . ESOPHAGOGASTRODUODENOSCOPY N/A 06/09/2014   Procedure: ESOPHAGOGASTRODUODENOSCOPY (EGD);  Surgeon: Rogene Houston, MD;  Location: [facility];  Service: Endoscopy;  Laterality: N/A;  200-moved to 145 Ann to notify pt    Family History  Problem Relation Age of Onset  . Diabetes Mother   . Rheumatic fever Mother 42  . Migraines Father 67  . Hypertension Father   . Autism Brother   . Heart disease Neg Hx     Social History:  reports that she has never smoked. She has never used smokeless tobacco. She reports that she does not drink alcohol or use drugs.    Review of Systems   She is complaining of muscle cramping, muscle  aches and some weakness and fatigue but has not been evaluated by her PCP  Lipid history:  has Been prescribed pravastatin and she has not been taking this as prescribed    Lab Results  Component Value Date   CHOL 174 09/02/2016   HDL 35.40 (L) 09/02/2016   LDLCALC 115 (H) 09/02/2016   LDLDIRECT 112.0 07/19/2016   TRIG 121.0 09/02/2016   CHOLHDL 5 09/02/2016           Eyes: Most recent eye exam was 11/16  She is on 5000 units of Vitamin D3    Lab Results  Component Value Date   VD25OH 23.05 (L) 07/19/2016   VD25OH 14.57 (L) 03/06/2016   Lab Results  Component Value Date   CALCIUM 9.0 11/28/2016      Physical Examination:  BP 120/72   Pulse 68   Ht 5\' 6"  (1.676 m)   Wt 289 lb (131.1 kg)   BMI 46.65 kg/m   No ankle edema    ASSESSMENT:  Diabetes type 2, uncontrolled with severe obesity    See history of present illness for detailed discussion of current diabetes management, blood sugar patterns and problems identified  Blood sugars are Not any better with adding low-dose basal insulin to her Victoza However she is not taking Jardiance now she thinks she cannot tolerate the recurrent candidiasis from this Had been intolerant to metformin in the past because of diarrhea She has gained weight, she is trying to do some exercise  Muscle cramps, weakness, fatigue and nonspecific pains: Will evaluate with magnesium, chemistry panel, CBC, she will need to see her PCP if all tests negative  PLAN:   Tresiba 20 units daily at night, may need further titration based on fasting blood sugars as discussed  Trial of Janumet XR 50/500, this may be better tolerated than metformin ER and she can even increase the dose to 2 tablets if she is able to  tolerate this after a week  More consistent diet  More consistent monitoring after meals  Regular exercise  Consider follow-up with dietitian  Consider Ozempic when this is covered by insurance  Hyperlipidemia: Will  discuss restarting Pravachol on the next visit especially if she has no further muscle aches and cramps   Patient Instructions  Check blood sugars on waking up daily  Also check blood sugars about 2 hours after a meal and do this after different meals by rotation  Recommended blood sugar levels on waking up is 90-130 and about 2 hours after meal is 130-160  Please bring your blood sugar monitor to each visit, thank you  Tresiba 20 units   Counseling time on subjects discussed above is over 50% of today's 25 minute visit     Kaleia Longhi 11/28/2016, 9:20 PM   Note: This office note was prepared with Dragon voice recognition system technology. Any transcriptional errors that result from this process are unintentional.  Addendum: Labs  Office Visit on 11/28/2016  Component Date Value Ref Range Status  . Sodium 11/28/2016 138  135 - 145 mEq/L Final  . Potassium 11/28/2016 3.8  3.5 - 5.1 mEq/L Final  . Chloride 11/28/2016 104  96 - 112 mEq/L Final  . CO2 11/28/2016 28  19 - 32 mEq/L Final  . Glucose, Bld 11/28/2016 162* 70 - 99 mg/dL Final  . BUN 11/28/2016 14  6 - 23 mg/dL Final  . Creatinine, Ser 11/28/2016 0.88  0.40 - 1.20 mg/dL Final  . Calcium 11/28/2016 9.0  8.4 - 10.5 mg/dL Final  . GFR 11/28/2016 92.86  >60.00 mL/min Final  . Microalb, Ur 11/28/2016 <0.7  0.0 - 1.9 mg/dL Final  . Creatinine,U 11/28/2016 175.9  mg/dL Final  . Microalb Creat Ratio 11/28/2016 0.4  0.0 - 30.0 mg/g Final  . Magnesium 11/28/2016 1.8  1.5 - 2.5 mg/dL Final  . TSH 11/28/2016 1.84  0.35 - 4.50 uIU/mL Final  . Free T4 11/28/2016 0.83  0.60 - 1.60 ng/dL Final   Comment: Specimens from patients who are undergoing biotin therapy and /or ingesting biotin supplements may contain high levels of biotin.  The higher biotin concentration in these specimens interferes with this Free T4 assay.  Specimens that contain high levels  of biotin may cause false high results for this Free T4 assay.  Please  interpret results in light of the total clinical presentation of the patient.    . WBC 11/28/2016 9.1  4.0 - 10.5 K/uL Final  . RBC 11/28/2016 4.94  3.87 - 5.11 Mil/uL Final  . Hemoglobin 11/28/2016 13.0  12.0 - 15.0 g/dL Final  . HCT 11/28/2016 40.4  36.0 - 46.0 % Final  . MCV 11/28/2016 81.8  78.0 - 100.0 fl Final  . MCHC 11/28/2016 32.2  30.0 - 36.0 g/dL Final  . RDW 11/28/2016 14.1  11.5 - 15.5 % Final  . Platelets 11/28/2016 289.0  150.0 - 400.0 K/uL Final  . Neutrophils Relative % 11/28/2016 72.3  43.0 - 77.0 % Final  . Lymphocytes Relative 11/28/2016 18.7  12.0 - 46.0 % Final  . Monocytes Relative 11/28/2016 6.4  3.0 - 12.0 % Final  . Eosinophils Relative 11/28/2016 2.0  0.0 - 5.0 % Final  . Basophils Relative 11/28/2016 0.6  0.0 - 3.0 % Final  . Neutro Abs 11/28/2016 6.6  1.4 - 7.7 K/uL Final  . Lymphs Abs 11/28/2016 1.7  0.7 - 4.0 K/uL Final  . Monocytes Absolute 11/28/2016 0.6  0.1 -  1.0 K/uL Final  . Eosinophils Absolute 11/28/2016 0.2  0.0 - 0.7 K/uL Final  . Basophils Absolute 11/28/2016 0.1  0.0 - 0.1 K/uL Final  . Hemoglobin A1C 11/28/2016 7.5   Final

## 2016-11-28 NOTE — Patient Instructions (Signed)
Check blood sugars on waking up daily  Also check blood sugars about 2 hours after a meal and do this after different meals by rotation  Recommended blood sugar levels on waking up is 90-130 and about 2 hours after meal is 130-160  Please bring your blood sugar monitor to each visit, thank you  Tresiba 20 units

## 2017-01-16 ENCOUNTER — Ambulatory Visit: Payer: PRIVATE HEALTH INSURANCE | Admitting: Endocrinology

## 2017-02-25 ENCOUNTER — Telehealth: Payer: Self-pay | Admitting: Endocrinology

## 2017-02-26 NOTE — Telephone Encounter (Signed)
Please advise if okay to refill. I see in recent note but did not see if you wanted to continue. Please advise.

## 2017-02-26 NOTE — Telephone Encounter (Signed)
She needs to continue but needs follow-up appointment made; she can have 1 month prescription without refill

## 2017-02-26 NOTE — Telephone Encounter (Signed)
I gave patient a 30 day supply until they can come for their visit.

## 2017-03-06 ENCOUNTER — Encounter (INDEPENDENT_AMBULATORY_CARE_PROVIDER_SITE_OTHER): Payer: Self-pay | Admitting: Internal Medicine

## 2017-03-06 ENCOUNTER — Ambulatory Visit (INDEPENDENT_AMBULATORY_CARE_PROVIDER_SITE_OTHER): Payer: PRIVATE HEALTH INSURANCE | Admitting: Internal Medicine

## 2017-03-06 VITALS — BP 126/88 | HR 76 | Temp 97.9°F | Resp 16 | Ht 66.0 in | Wt 289.0 lb

## 2017-03-06 DIAGNOSIS — R197 Diarrhea, unspecified: Secondary | ICD-10-CM | POA: Diagnosis not present

## 2017-03-06 DIAGNOSIS — R140 Abdominal distension (gaseous): Secondary | ICD-10-CM

## 2017-03-06 MED ORDER — DICYCLOMINE HCL 10 MG PO CAPS: 10.0000 mg | ORAL_CAPSULE | Freq: Three times a day (TID) | ORAL | 0 refills | Status: DC

## 2017-03-06 NOTE — Progress Notes (Signed)
Subjective:    Patient ID: Julie Berry, female    DOB: [birth year], 36 y.o.   MRN: [id number]  HPI Here today with c/o nausea. Last seen in November of 2015.  She says she feels bloated. She has had weight gain.  She has diarrhea after eating. She says everything she eats goes straight thru her.  She has gained 5 pounds since her visit in 2015. Diabetic x 5 yrs.  Has been on Insulin x 2 yrs. Appetite is good. No weight loss. She sometimes has burning after eating.  She has a BM daily x 3.  HA1C in April 7.5  06/12/2014 DG Esophagram;  CLINICAL DATA:  Epigastric abdominal pain, heartburn, with nausea and vomiting. Hoarseness. Dysphagia with solids and liquids.  IMPRESSION: Normal barium esophagram.   06/10/2015 EGD: Post prandial diarrhea and abdominal cramps:  Comment; Somewhat dilated esophageal body may indicate esophageal motility disorder since she does not have distal stricture or tight LES.  Review of Systems Past Medical History:  Diagnosis Date  . Complication of anesthesia    heartrate dropped with anesthesia  . Depressed   . Diabetes mellitus without complication ([place])   . Gallstones   . HTN (hypertension)   . Hyperglycemia   . Leukocytosis   . Low back pain   . Lump or mass in breast   . Malaise and fatigue   . Morbid obesity ([place])   . Sciatica   . Sleep apnea   . UTI (urinary tract infection)     Past Surgical History:  Procedure Laterality Date  . CHOLECYSTECTOMY  06/22/2012   Procedure: LAPAROSCOPIC CHOLECYSTECTOMY;  Surgeon: Jamesetta So, MD;  Location: [facility];  Service: General;  Laterality: N/A;  . ESOPHAGOGASTRODUODENOSCOPY N/A 06/09/2014   Procedure: ESOPHAGOGASTRODUODENOSCOPY (EGD);  Surgeon: Rogene Houston, MD;  Location: [facility];  Service: Endoscopy;  Laterality: N/A;  200-moved to 145 Ann to notify pt    Allergies  Allergen Reactions  . Amoxicillin Anaphylaxis and Other (See Comments)    Has patient had a PCN reaction causing  immediate rash, facial/tongue/throat swelling, SOB or lightheadedness with hypotension: Yes Has patient had a PCN reaction causing severe rash involving mucus membranes or skin necrosis: No Has patient had a PCN reaction that required hospitalization No Has patient had a PCN reaction occurring within the last 10 years: No If all of the above answers are "NO", then may proceed with Cephalosporin use.  Marland Kitchen Penicillins Anaphylaxis and Other (See Comments)    Has patient had a PCN reaction causing immediate rash, facial/tongue/throat swelling, SOB or lightheadedness with hypotension: Yes Has patient had a PCN reaction causing severe rash involving mucus membranes or skin necrosis: No Has patient had a PCN reaction that required hospitalization No Has patient had a PCN reaction occurring within the last 10 years: No If all of the above answers are "NO", then may proceed with Cephalosporin use.  . Dilaudid [Hydromorphone] Nausea And Vomiting and Other (See Comments)    Reaction:  Burning   . Latex Rash    Current Outpatient Prescriptions on File Prior to Visit  Medication Sig Dispense Refill  . ibuprofen (ADVIL,MOTRIN) 600 MG tablet Take 1 tablet (600 mg total) by mouth every 6 (six) hours as needed. 30 tablet 0  . insulin degludec (TRESIBA FLEXTOUCH) 100 UNIT/ML SOPN FlexTouch Pen Inject 0.15 mLs (15 Units total) into the skin daily. 5 pen 1  . SitaGLIPtin-MetFORMIN HCl (JANUMET XR) 50-500 MG TB24 2 tabs qd 60  tablet 2  . VICTOZA 18 MG/3ML SOPN INJECT 1.8MG  SUBCUTANEOUSLY ONCE DAILY AT THE SAME TIME. 3 mL 0   No current facility-administered medications on file prior to visit.         Objective:   Physical Exam Blood pressure 126/88, pulse 76, temperature 97.9 F (36.6 C), resp. rate 16, height 5\' 6"  (1.676 m), weight 289 lb (131.1 kg). Alert and oriented. Skin warm and dry. Oral mucosa is moist.   . Sclera anicteric, conjunctivae is pink. Thyroid not enlarged. No cervical lymphadenopathy.  Lungs clear. Heart regular rate and rhythm.  Abdomen is soft. Bowel sounds are positive. No hepatomegaly. No abdominal masses felt. No tenderness.  No edema to lower extremities.           Assessment & Plan:  Bloating. Am going to get an Emptying study given hx of  diabetes. Diarrhea, IBS. Am going to start her on Dicyclomine TID. Further recommendations to follow. Nees to diet and exercise.

## 2017-03-06 NOTE — Patient Instructions (Signed)
Diet and exercise. NM Emptying study. Rx for Dicylomine 10mg  TID

## 2017-03-07 ENCOUNTER — Encounter: Payer: Self-pay | Admitting: Endocrinology

## 2017-03-07 ENCOUNTER — Ambulatory Visit (INDEPENDENT_AMBULATORY_CARE_PROVIDER_SITE_OTHER): Payer: PRIVATE HEALTH INSURANCE | Admitting: Endocrinology

## 2017-03-07 VITALS — BP 116/78 | HR 90 | Ht 66.0 in | Wt 290.2 lb

## 2017-03-07 DIAGNOSIS — E1165 Type 2 diabetes mellitus with hyperglycemia: Secondary | ICD-10-CM

## 2017-03-07 DIAGNOSIS — Z794 Long term (current) use of insulin: Secondary | ICD-10-CM

## 2017-03-07 DIAGNOSIS — E782 Mixed hyperlipidemia: Secondary | ICD-10-CM

## 2017-03-07 LAB — POCT GLYCOSYLATED HEMOGLOBIN (HGB A1C): Hemoglobin A1C: 8.3

## 2017-03-07 MED ORDER — FLUCONAZOLE 150 MG PO TABS: 150.0000 mg | ORAL_TABLET | Freq: Once | ORAL | 5 refills | Status: AC

## 2017-03-07 MED ORDER — EMPAGLIFLOZIN 10 MG PO TABS: 10.0000 mg | ORAL_TABLET | Freq: Every day | ORAL | 3 refills | Status: DC

## 2017-03-07 NOTE — Telephone Encounter (Signed)
error 

## 2017-03-07 NOTE — Patient Instructions (Addendum)
Victoza 1.2 mg daily  Tresiba 24 daily  Jardiace in am  Check blood sugars on waking up  3/7  Also check blood sugars about 2 hours after a meal and do this after different meals by rotation  Recommended blood sugar levels on waking up is 90-130 and about 2 hours after meal is 130-160  Please bring your blood sugar monitor to each visit, thank you

## 2017-03-07 NOTE — Progress Notes (Signed)
Patient ID: Julie Berry, female   DOB: 01-19-1981, 36 y.o.   MRN: [id number]           Reason for Appointment: follow-upfor Type 2 Diabetes  Referring physician: Gerarda Fraction  History of Present Illness:          Date of diagnosis of type 2 diabetes mellitus:   11/2013       Background history:  She thinks she had blood sugars of about 300 when she was first diagnosed; although she was complaining of some fatigue she was diagnosed with routine labs on her annual visit.  Her records indicate she had an A1c of 6.3 in 12/2011 She was started on metformin twice a day but she could not tolerate it well because of diarrhea.  She thinks she tried to take it for about 6 months with some improvement in her blood sugars, subsequently stopped.  Because she had persistently high blood sugars even with starting glipizide she was started on a regimen of Victoza and Jardiance on her initial consultation in 03/2015; her average blood sugar prior to this was 250 with baseline A1c 10.6   Recent history:       Her A1c is higher at 8.3, previously was  7.5   INSULIN regimen: Tresiba 15 units daily  Non-insulin hypoglycemic regimen: Victoza 1.8 mg daily  Current blood sugar patterns from her glucose monitor download, current management and problems identified:  She was advised to increase her [place] in April up to 20 units and further adjusted needed for fasting hyperglycemia but she did not do so  She was tried on Janumet to see if she will tolerate a metformin product but she had diarrhea and did not take this  She has not monitored any efforts to lose weight with walking or changing her diet  FASTING blood sugars are mostly high and appear to be worse than before with average about 218  She has only a few readings later in the day which are variably high with a couple of readings over 300 also, not checking postprandial readings consistently  She did not call for follow-up appointment and does not  appear to be too concerned about her hyperglycemia   Oral hypoglycemic drugs the patient is taking are:   none   Side effects from medications have been: Metfomin causes diarrhea.  Candidiasis  from Jardiance  Compliance with the medical regimen: Inconsistent Hypoglycemia: Never    Glucose monitoring:   Done about once a day    Glucometer: One Touch.      Blood Glucose readings :   FASTING raise 1 49-259 with median 215 She has 4 nonfasting readings ranging from 1 86-323  Overall MEDIAN 215  Self-care: The diet that the patient has been following is: tries to limit sodas, fast food, fried foods .     Typical meal intake: Breakfast is mostly  cereal.  Usually eating grilled chicken at lunch and not always consistent with diet               Dietician visit, most recent: 6/15               Exercise: not walking recently  Weight history:  Wt Readings from Last 3 Encounters:  03/07/17 290 lb 3.2 oz (131.6 kg)  03/06/17 289 lb (131.1 kg)  11/28/16 289 lb (131.1 kg)    Glycemic control:   Lab Results  Component Value Date   HGBA1C 8.3 03/07/2017   HGBA1C 7.5 11/28/2016  HGBA1C 6.6 07/22/2016   Lab Results  Component Value Date   MICROALBUR <0.7 11/28/2016   LDLCALC 115 (H) 09/02/2016   CREATININE 0.88 11/28/2016    Other problems discussed today: See review of systems   Allergies as of 03/07/2017      Reactions   Amoxicillin Anaphylaxis, Other (See Comments)   Has patient had a PCN reaction causing immediate rash, facial/tongue/throat swelling, SOB or lightheadedness with hypotension: Yes Has patient had a PCN reaction causing severe rash involving mucus membranes or skin necrosis: No Has patient had a PCN reaction that required hospitalization No Has patient had a PCN reaction occurring within the last 10 years: No If all of the above answers are "NO", then may proceed with Cephalosporin use.   Penicillins Anaphylaxis, Other (See Comments)   Has patient had a PCN  reaction causing immediate rash, facial/tongue/throat swelling, SOB or lightheadedness with hypotension: Yes Has patient had a PCN reaction causing severe rash involving mucus membranes or skin necrosis: No Has patient had a PCN reaction that required hospitalization No Has patient had a PCN reaction occurring within the last 10 years: No If all of the above answers are "NO", then may proceed with Cephalosporin use.   Dilaudid [hydromorphone] Nausea And Vomiting, Other (See Comments)   Reaction:  Burning    Latex Rash      Medication List       Accurate as of 03/07/17  3:27 PM. Always use your most recent med list.          dicyclomine 10 MG capsule Commonly known as:  BENTYL Take 1 capsule (10 mg total) by mouth 3 (three) times daily before meals.   empagliflozin 10 MG Tabs tablet Commonly known as:  JARDIANCE Take 10 mg by mouth daily with breakfast.   fluconazole 150 MG tablet Commonly known as:  DIFLUCAN Take 1 tablet (150 mg total) by mouth once.   insulin degludec 100 UNIT/ML Sopn FlexTouch Pen Commonly known as:  TRESIBA FLEXTOUCH Inject 0.15 mLs (15 Units total) into the skin daily.   VICTOZA 18 MG/3ML Sopn Generic drug:  liraglutide INJECT 1.8MG  SUBCUTANEOUSLY ONCE DAILY AT THE SAME TIME.       Allergies:  Allergies  Allergen Reactions  . Amoxicillin Anaphylaxis and Other (See Comments)    Has patient had a PCN reaction causing immediate rash, facial/tongue/throat swelling, SOB or lightheadedness with hypotension: Yes Has patient had a PCN reaction causing severe rash involving mucus membranes or skin necrosis: No Has patient had a PCN reaction that required hospitalization No Has patient had a PCN reaction occurring within the last 10 years: No If all of the above answers are "NO", then may proceed with Cephalosporin use.  Marland Kitchen Penicillins Anaphylaxis and Other (See Comments)    Has patient had a PCN reaction causing immediate rash, facial/tongue/throat  swelling, SOB or lightheadedness with hypotension: Yes Has patient had a PCN reaction causing severe rash involving mucus membranes or skin necrosis: No Has patient had a PCN reaction that required hospitalization No Has patient had a PCN reaction occurring within the last 10 years: No If all of the above answers are "NO", then may proceed with Cephalosporin use.  . Dilaudid [Hydromorphone] Nausea And Vomiting and Other (See Comments)    Reaction:  Burning   . Latex Rash    Past Medical History:  Diagnosis Date  . Complication of anesthesia    heartrate dropped with anesthesia  . Depressed   . Diabetes mellitus without complication (  HCC)   . Gallstones   . HTN (hypertension)   . Hyperglycemia   . Leukocytosis   . Low back pain   . Lump or mass in breast   . Malaise and fatigue   . Morbid obesity ([place])   . Sciatica   . Sleep apnea   . UTI (urinary tract infection)     Past Surgical History:  Procedure Laterality Date  . CHOLECYSTECTOMY  06/22/2012   Procedure: LAPAROSCOPIC CHOLECYSTECTOMY;  Surgeon: Jamesetta So, MD;  Location: [facility];  Service: General;  Laterality: N/A;  . ESOPHAGOGASTRODUODENOSCOPY N/A 06/09/2014   Procedure: ESOPHAGOGASTRODUODENOSCOPY (EGD);  Surgeon: Rogene Houston, MD;  Location: [facility];  Service: Endoscopy;  Laterality: N/A;  200-moved to 145 Ann to notify pt    Family History  Problem Relation Age of Onset  . Diabetes Mother   . Rheumatic fever Mother 60  . Migraines Father 32  . Hypertension Father   . Autism Brother   . Heart disease Neg Hx     Social History:  reports that she has never smoked. She has never used smokeless tobacco. She reports that she does not drink alcohol or use drugs.    Review of Systems    Lipid history:  has Been prescribed pravastatin and she has not been taking this as prescribed, does not now think that she had muscle aches with this    Lab Results  Component Value Date   CHOL 174 09/02/2016    HDL 35.40 (L) 09/02/2016   LDLCALC 115 (H) 09/02/2016   LDLDIRECT 112.0 07/19/2016   TRIG 121.0 09/02/2016   CHOLHDL 5 09/02/2016           Eyes: Most recent eye exam was 11/16  She was recommended 5000 units of Vitamin D3 , has not followed up with PCP about this   Lab Results  Component Value Date   VD25OH 23.05 (L) 07/19/2016   VD25OH 14.57 (L) 03/06/2016   Lab Results  Component Value Date   CALCIUM 9.0 11/28/2016    She is having less prandial diarrhea and is starting Bentyl She has some nausea but does not appear to have early satiety or vomiting but is going to get a gastric emptying study, notes from gastroenterologist reviewed   Physical Examination:  BP 116/78   Pulse 90   Ht 5\' 6"  (1.676 m)   Wt 290 lb 3.2 oz (131.6 kg)   SpO2 95%   BMI 46.84 kg/m       ASSESSMENT:  Diabetes type 2, uncontrolled with severe obesity    See history of present illness for detailed discussion of current diabetes management, blood sugar patterns and problems identified  Blood sugars are worse with A1c now over 8% and getting progressively higher She is appearing to be more insulin deficient Despite using Victoza she is not losing weight; currently Ozempic is not covered  Discussed that she will probably need basal bolus insulin regimen and higher doses of insulin Also discussed the use of the V-go pump as a simpler option than multiple injection Previously had been resisting the idea of taking Jardiance because of recurrent vaginal irritation/candidiasis from this Cannot tolerate metformin Again her lifestyle can be better with consistent exercise and better diet  Hyperlipidemia: Will need reassessment  PLAN:   Tresiba 24 units daily at night, discussed in detail the use of a flowsheet to adjust the dose every 3 days to bring the blood sugar down to under 130 in  the morning with increments of 2 units  She will try Jardiance again but she can also use Diflucan as needed  for OTC Monistat if she has candidiasis  Increase fluid intake and maintain genital hygiene  Consistent blood sugar monitoring especially postprandial and bring monitor for download in 6 weeks  May reduce Victoza to 1.2 mg temporarily  Regular exercise  Consider follow-up with dietitian  Consider Ozempic when this is covered by insurance     Patient Instructions  Victoza 1.2 mg daily  Tresiba 24 daily  Jardiace in am  Check blood sugars on waking up  3/7  Also check blood sugars about 2 hours after a meal and do this after different meals by rotation  Recommended blood sugar levels on waking up is 90-130 and about 2 hours after meal is 130-160  Please bring your blood sugar monitor to each visit, thank you   Counseling time on subjects discussed above is over 50% of today's 25 minute visit     Nasiir Monts 03/07/2017, 3:27 PM   Note: This office note was prepared with Dragon voice recognition system technology. Any transcriptional errors that result from this process are unintentional.  Addendum: Labs  Office Visit on 03/07/2017  Component Date Value Ref Range Status  . Hemoglobin A1C 03/07/2017 8.3   Final

## 2017-03-11 ENCOUNTER — Other Ambulatory Visit (HOSPITAL_COMMUNITY): Payer: PRIVATE HEALTH INSURANCE

## 2017-03-15 ENCOUNTER — Other Ambulatory Visit: Payer: Self-pay | Admitting: Endocrinology

## 2017-03-18 ENCOUNTER — Encounter (HOSPITAL_COMMUNITY)
Admission: RE | Admit: 2017-03-18 | Discharge: 2017-03-18 | Disposition: A | Discharge status: Home / Self Care | Payer: PRIVATE HEALTH INSURANCE | Source: Ambulatory Visit | Attending: Internal Medicine | Admitting: Internal Medicine

## 2017-03-18 ENCOUNTER — Encounter (HOSPITAL_COMMUNITY): Payer: Self-pay

## 2017-03-18 DIAGNOSIS — R140 Abdominal distension (gaseous): Secondary | ICD-10-CM | POA: Insufficient documentation

## 2017-03-18 DIAGNOSIS — R197 Diarrhea, unspecified: Secondary | ICD-10-CM | POA: Insufficient documentation

## 2017-03-18 MED ORDER — TECHNETIUM TC 99M SULFUR COLLOID
2.0000 | Freq: Once | INTRAVENOUS | Status: AC | PRN
Start: 2017-03-18 — End: 2017-03-18
  Administered 2017-03-18: 2.1 via ORAL

## 2017-04-01 ENCOUNTER — Telehealth (INDEPENDENT_AMBULATORY_CARE_PROVIDER_SITE_OTHER): Payer: Self-pay | Admitting: Internal Medicine

## 2017-04-01 NOTE — Telephone Encounter (Signed)
Patient called, stated that she received the results from her swallowing test.  She wants to know if she needs to follow up with [place] and should she continue the medication that was prescribed?  [phone]

## 2017-04-01 NOTE — Telephone Encounter (Signed)
Results given to patient again. She says has some constipation with the dicyclomine. The dicyclomine is helping. I told her she could back off on the dicyclomine to one a day and see how that works. I also advised her to eat 4-5 small meals a day to prevent so much bloating.

## 2017-04-17 ENCOUNTER — Ambulatory Visit: Payer: PRIVATE HEALTH INSURANCE | Admitting: Endocrinology

## 2017-04-26 ENCOUNTER — Other Ambulatory Visit: Payer: Self-pay | Admitting: Endocrinology

## 2017-04-29 ENCOUNTER — Telehealth: Payer: Self-pay | Admitting: Endocrinology

## 2017-04-29 NOTE — Telephone Encounter (Signed)
Patient states that Dr. Dwyane Dee told her that he would prescribe something for her to take with her empagliflozin (JARDIANCE) 10 MG TABS tablet to help with avoiding yeast infections. Patient is requesting that medication be sent into  [facility], [place] (Phone) [phone] (Fax)   Call patient to advise further if needed.

## 2017-04-30 MED ORDER — FLUCONAZOLE 150 MG PO TABS: 150.0000 mg | ORAL_TABLET | Freq: Once | ORAL | 0 refills | Status: AC

## 2017-04-30 NOTE — Telephone Encounter (Signed)
Spoke to pt, told her Rx for Diflucan was sent to pharmacy for yeast infection. Also Dr. Dwyane Dee said you need to make an appt as soon as possible for follow up. Pt verbalized understanding and will call back and schedule appt.

## 2017-04-30 NOTE — Telephone Encounter (Signed)
Please see message and advise 

## 2017-04-30 NOTE — Telephone Encounter (Signed)
Please send one tablet of Diflucan 150 mg with no refills.  She needs to make an appointment for follow-up for her diabetes as soon as possible

## 2017-05-12 ENCOUNTER — Telehealth: Payer: Self-pay | Admitting: Endocrinology

## 2017-05-12 NOTE — Telephone Encounter (Signed)
Please find out and also need to schedule her follow-up she has not been done, same day labs

## 2017-05-12 NOTE — Telephone Encounter (Signed)
She can stop the Jardiance but needs to see me as soon as possible with detailed home blood sugars

## 2017-05-12 NOTE — Telephone Encounter (Signed)
Patient returning missed phone call and stated she has been having "irritation when using restroom" taking this medication. Please call back and advise.

## 2017-05-12 NOTE — Telephone Encounter (Signed)
Patient called in reference to wanting to see Dr. Dwyane Berry about Rx for empagliflozin (JARDIANCE) 10 MG TABS tablet. Patient would like to try another medication. Please call patient and advise.

## 2017-05-12 NOTE — Telephone Encounter (Signed)
Called patient and left a voice message to get more information about the reason for change.  Please advise.

## 2017-05-12 NOTE — Telephone Encounter (Signed)
Called patient and advised her of Dr. Jodelle Green note. She is going to call back in the morning to see what we have available for an appointment for this week. Please schedule ASAP!

## 2017-05-12 NOTE — Telephone Encounter (Signed)
Please advise 

## 2017-05-20 NOTE — Telephone Encounter (Signed)
Patient called in reference to having same issues with yeast infection. Patient would like to know if the same Rx can be prescribed for her that was prescribed last time for this issue. Please call patient and advise.

## 2017-05-21 ENCOUNTER — Other Ambulatory Visit: Payer: Self-pay

## 2017-05-21 MED ORDER — FLUCONAZOLE 150 MG PO TABS: 150.0000 mg | ORAL_TABLET | Freq: Every day | ORAL | 1 refills | Status: DC

## 2017-05-21 NOTE — Telephone Encounter (Signed)
Please send Diflucan 150 mg tablet

## 2017-05-21 NOTE — Telephone Encounter (Signed)
Please advise 

## 2017-05-21 NOTE — Telephone Encounter (Signed)
Called patient and left a voice message to let her know that we have sent in the Diflucan 150 mg to the [facility] in [place] for her to pick up and to call us back if symptoms continue.

## 2017-05-22 ENCOUNTER — Telehealth: Payer: Self-pay | Admitting: *Deleted

## 2017-05-22 NOTE — Telephone Encounter (Signed)
Patient called stated states she was returning a missed voice message that was left for her. Got permission from Dr. Dwyane Dee nurse to relay message to patient. See previous message. Patient verbalize understanding . Thank you

## 2017-05-23 ENCOUNTER — Other Ambulatory Visit: Payer: Self-pay | Admitting: Endocrinology

## 2017-06-03 ENCOUNTER — Ambulatory Visit: Payer: PRIVATE HEALTH INSURANCE | Admitting: Endocrinology

## 2017-06-29 ENCOUNTER — Other Ambulatory Visit: Payer: Self-pay | Admitting: Endocrinology

## 2017-07-01 ENCOUNTER — Other Ambulatory Visit: Payer: Self-pay

## 2017-07-01 ENCOUNTER — Telehealth: Payer: Self-pay | Admitting: Endocrinology

## 2017-07-01 MED ORDER — LIRAGLUTIDE 18 MG/3ML ~~LOC~~ SOPN: PEN_INJECTOR | SUBCUTANEOUS | 0 refills | Status: DC

## 2017-07-01 NOTE — Telephone Encounter (Signed)
This has been ordered 

## 2017-07-01 NOTE — Telephone Encounter (Signed)
Patient need a refill for VICTOZA 18 MG/3ML SOPN [[id number]] send to [place], [place]

## 2017-07-03 ENCOUNTER — Encounter: Payer: Self-pay | Admitting: Endocrinology

## 2017-07-03 ENCOUNTER — Ambulatory Visit (INDEPENDENT_AMBULATORY_CARE_PROVIDER_SITE_OTHER): Payer: PRIVATE HEALTH INSURANCE | Admitting: Endocrinology

## 2017-07-03 VITALS — BP 136/76 | HR 86 | Ht 66.0 in | Wt 290.4 lb

## 2017-07-03 DIAGNOSIS — Z794 Long term (current) use of insulin: Secondary | ICD-10-CM | POA: Diagnosis not present

## 2017-07-03 DIAGNOSIS — E1165 Type 2 diabetes mellitus with hyperglycemia: Secondary | ICD-10-CM

## 2017-07-03 DIAGNOSIS — E782 Mixed hyperlipidemia: Secondary | ICD-10-CM

## 2017-07-03 LAB — LIPID PANEL
Cholesterol: 169 mg/dL (ref 0–200)
HDL: 38.8 mg/dL — ABNORMAL LOW (ref >39.00)
LDL Cholesterol: 115 mg/dL — ABNORMAL HIGH (ref 0–99)
NonHDL: 130.25
TRIGLYCERIDES: 75 mg/dL (ref 0.0–149.0)
Total CHOL/HDL Ratio: 4
VLDL: 15 mg/dL (ref 0.0–40.0)

## 2017-07-03 LAB — COMPREHENSIVE METABOLIC PANEL
ALBUMIN: 3.7 g/dL (ref 3.5–5.2)
ALK PHOS: 58 U/L (ref 39–117)
ALT: 16 U/L (ref 0–35)
AST: 15 U/L (ref 0–37)
BUN: 15 mg/dL (ref 6–23)
CHLORIDE: 102 meq/L (ref 96–112)
CO2: 28 mEq/L (ref 19–32)
Calcium: 9.4 mg/dL (ref 8.4–10.5)
Creatinine, Ser: 0.84 mg/dL (ref 0.40–1.20)
GFR: 97.67 mL/min (ref >60.00)
Glucose, Bld: 184 mg/dL — ABNORMAL HIGH (ref 70–99)
POTASSIUM: 3.9 meq/L (ref 3.5–5.1)
SODIUM: 136 meq/L (ref 135–145)
TOTAL PROTEIN: 7.3 g/dL (ref 6.0–8.3)
Total Bilirubin: 0.4 mg/dL (ref 0.2–1.2)

## 2017-07-03 LAB — POCT GLYCOSYLATED HEMOGLOBIN (HGB A1C): Hemoglobin A1C: 7.3

## 2017-07-03 MED ORDER — DULAGLUTIDE 1.5 MG/0.5ML ~~LOC~~ SOAJ
SUBCUTANEOUS | 1 refills | Status: DC
Start: 2017-07-03 — End: 2017-07-17

## 2017-07-03 NOTE — Progress Notes (Signed)
Patient ID: Julie Berry, female   DOB: 04/17/1981, 36 y.o.   MRN: [id number]           Reason for Appointment: follow-up for Type 2 Diabetes  Referring physician: Gerarda Fraction  History of Present Illness:          Date of diagnosis of type 2 diabetes mellitus:   11/2013       Background history:  She thinks she had blood sugars of about 300 when she was first diagnosed; although she was complaining of some fatigue she was diagnosed with routine labs on her annual visit.  Her records indicate she had an A1c of 6.3 in 12/2011 She was started on metformin twice a day but she could not tolerate it well because of diarrhea.  She thinks she tried to take it for about 6 months with some improvement in her blood sugars, subsequently stopped.  Because she had persistently high blood sugars even with starting glipizide she was started on a regimen of Victoza and Jardiance on her initial consultation in 03/2015; her average blood sugar prior to this was 250 with baseline A1c 10.6   Recent history:       Her A1c is relatively better at 7.3 compared to 8.3 However she has not been seen in follow-up since July   INSULIN regimen: Tresiba 28 units daily  Non-insulin hypoglycemic regimen: Victoza 1.8 mg daily  Current blood sugar patterns from her glucose monitor download, current management and problems identified:  She was tried on Jardiance on her last visit because of poor control otherwise and she was not able to continue this because of recurrent candidiasis.  She thinks she had stopped it about 4-6 weeks ago  RECENT blood sugars are significantly high, highest 283 on Monday morning  She is however not checking readings after meals except 3 times with one high reading of 331 at bedtime  She thinks her blood sugars were as low as 140 with target  Has not increase her insulin except 4 units since her last visit   advised to increase her Tresiba in April up to 20 units and further adjusted  needed for fasting hyperglycemia but she did not do so  She was tried on Janumet to see if she will tolerate a metformin product but she had diarrhea and did not take this  She has not monitored any efforts to lose weight with walking or changing her diet  FASTING blood sugars are mostly high and appear to be worse than before with average about 218  She has only a few readings later in the day which are variably high with a couple of readings over 300 also, not checking postprandial readings consistently  She did not call for follow-up appointment and does not appear to be too concerned about her hyperglycemia   Oral hypoglycemic drugs the patient is taking are:   none   Side effects from medications have been: Metfomin causes diarrhea.  Candidiasis  from Jardiance  Compliance with the medical regimen: Inconsistent Hypoglycemia: Never    Glucose monitoring:   Done about once a day    Glucometer: One Touch.      Blood Glucose readings :   Mean values apply above for all meters except median for One Touch  PRE-MEAL Fasting Lunch Dinner Bedtime Overall  Glucose range: 1 77-283    195-331    Mean/median: 212     213     Self-care: The diet that the patient has  been following is: tries to limit sodas, fast food, fried foods .     Typical meal intake: Breakfast is mostly  cereal.  Usually eating grilled chicken at lunch and not always consistent with diet                Dietician visit, most recent: 6/15               Exercise: walking recently about twice a week  Weight history:  Wt Readings from Last 3 Encounters:  07/03/17 290 lb 6.4 oz (131.7 kg)  03/07/17 290 lb 3.2 oz (131.6 kg)  03/06/17 289 lb (131.1 kg)    Glycemic control:   Lab Results  Component Value Date   HGBA1C 7.3 07/03/2017   HGBA1C 8.3 03/07/2017   HGBA1C 7.5 11/28/2016   Lab Results  Component Value Date   MICROALBUR <0.7 11/28/2016   [place] 115 (H) 09/02/2016   CREATININE 0.88 11/28/2016     Other problems discussed today: See review of systems   Allergies as of 07/03/2017      Reactions   Amoxicillin Anaphylaxis, Other (See Comments)   Has patient had a PCN reaction causing immediate rash, facial/tongue/throat swelling, SOB or lightheadedness with hypotension: Yes Has patient had a PCN reaction causing severe rash involving mucus membranes or skin necrosis: No Has patient had a PCN reaction that required hospitalization No Has patient had a PCN reaction occurring within the last 10 years: No If all of the above answers are "NO", then may proceed with Cephalosporin use.   Penicillins Anaphylaxis, Other (See Comments)   Has patient had a PCN reaction causing immediate rash, facial/tongue/throat swelling, SOB or lightheadedness with hypotension: Yes Has patient had a PCN reaction causing severe rash involving mucus membranes or skin necrosis: No Has patient had a PCN reaction that required hospitalization No Has patient had a PCN reaction occurring within the last 10 years: No If all of the above answers are "NO", then may proceed with Cephalosporin use.   Dilaudid [hydromorphone] Nausea And Vomiting, Other (See Comments)   Reaction:  Burning    Latex Rash      Medication List        Accurate as of 07/03/17  1:07 PM. Always use your most recent med list.          dicyclomine 10 MG capsule Commonly known as:  BENTYL Take 1 capsule (10 mg total) by mouth 3 (three) times daily before meals.   Dulaglutide 1.5 MG/0.5ML Sopn Commonly known as:  TRULICITY 1.5 mg weekly   empagliflozin 10 MG Tabs tablet Commonly known as:  JARDIANCE Take 10 mg by mouth daily with breakfast.   fluconazole 150 MG tablet Commonly known as:  DIFLUCAN Take 1 tablet (150 mg total) by mouth daily.   liraglutide 18 MG/3ML Sopn Commonly known as:  VICTOZA INJECT 1.8MG  SUBCUTANEOUSLY ONCE DAILY AT THE SAME TIME.   TRESIBA FLEXTOUCH 100 UNIT/ML Sopn FlexTouch Pen Generic drug:   insulin degludec INJECT 24 UNITS SUBCUTANEOUSLY DAILY.       Allergies:  Allergies  Allergen Reactions  . Amoxicillin Anaphylaxis and Other (See Comments)    Has patient had a PCN reaction causing immediate rash, facial/tongue/throat swelling, SOB or lightheadedness with hypotension: Yes Has patient had a PCN reaction causing severe rash involving mucus membranes or skin necrosis: No Has patient had a PCN reaction that required hospitalization No Has patient had a PCN reaction occurring within the last 10 years: No If all of  the above answers are "NO", then may proceed with Cephalosporin use.  Marland Kitchen Penicillins Anaphylaxis and Other (See Comments)    Has patient had a PCN reaction causing immediate rash, facial/tongue/throat swelling, SOB or lightheadedness with hypotension: Yes Has patient had a PCN reaction causing severe rash involving mucus membranes or skin necrosis: No Has patient had a PCN reaction that required hospitalization No Has patient had a PCN reaction occurring within the last 10 years: No If all of the above answers are "NO", then may proceed with Cephalosporin use.  . Dilaudid [Hydromorphone] Nausea And Vomiting and Other (See Comments)    Reaction:  Burning   . Latex Rash    Past Medical History:  Diagnosis Date  . Complication of anesthesia    heartrate dropped with anesthesia  . Depressed   . Diabetes mellitus without complication ([place])   . Gallstones   . HTN (hypertension)   . Hyperglycemia   . Leukocytosis   . Low back pain   . Lump or mass in breast   . Malaise and fatigue   . Morbid obesity ([place])   . Sciatica   . Sleep apnea   . UTI (urinary tract infection)     Past Surgical History:  Procedure Laterality Date  . CHOLECYSTECTOMY  06/22/2012   Procedure: LAPAROSCOPIC CHOLECYSTECTOMY;  Surgeon: Jamesetta So, MD;  Location: [facility];  Service: General;  Laterality: N/A;  . ESOPHAGOGASTRODUODENOSCOPY N/A 06/09/2014   Procedure:  ESOPHAGOGASTRODUODENOSCOPY (EGD);  Surgeon: Rogene Houston, MD;  Location: [facility];  Service: Endoscopy;  Laterality: N/A;  200-moved to 145 Ann to notify pt    Family History  Problem Relation Age of Onset  . Diabetes Mother   . Rheumatic fever Mother 20  . Migraines Father 35  . Hypertension Father   . Autism Brother   . Heart disease Neg Hx     Social History:  reports that  has never smoked. she has never used smokeless tobacco. She reports that she does not drink alcohol or use drugs.    Review of Systems    Lipid history:  has Been prescribed pravastatin and she has not been taking this as prescribed, she had muscle aches with this Not clear if she has taken any other statin drugs    Lab Results  Component Value Date   CHOL 174 09/02/2016   HDL 35.40 (L) 09/02/2016   LDLCALC 115 (H) 09/02/2016   LDLDIRECT 112.0 07/19/2016   TRIG 121.0 09/02/2016   CHOLHDL 5 09/02/2016            She was recommended 5000 units of Vitamin D3 for vitamin D deficiency   Lab Results  Component Value Date   VD25OH 23.05 (L) 07/19/2016   VD25OH 14.57 (L) 03/06/2016   Lab Results  Component Value Date   CALCIUM 9.0 11/28/2016      Physical Examination:  BP 136/76   Pulse 86   Ht 5\' 6"  (1.676 m)   Wt 290 lb 6.4 oz (131.7 kg)   SpO2 98%   BMI 46.87 kg/m   Diabetic Foot Exam - Simple   Simple Foot Form Diabetic Foot exam was performed with the following findings:  Yes   Visual Inspection No deformities, no ulcerations, no other skin breakdown bilaterally:  Yes Sensation Testing Intact to touch and monofilament testing bilaterally:  Yes Pulse Check Posterior Tibialis and Dorsalis pulse intact bilaterally:  Yes Comments        ASSESSMENT:  Diabetes type  2, uncontrolled with morbid obesity     See history of present illness for detailed discussion of current diabetes management, blood sugar patterns and problems identified  Blood sugars are worse with her  stopping Jardiance Although A1c is 7.3 her blood sugars are averaging well over 200 Currently on a regimen of Victoza and basal insulin alone She still tends to have relatively high fasting readings but has inadequate monitoring after meals  A1c now over 8% and getting progressively higher She is appearing to be more insulin deficient Despite using Victoza she is not losing weight; currently Ozempic is not covered  Discussed that she will probably need basal bolus insulin regimen and higher doses of insulin Also discussed the use of the V-go pump as a simpler option than multiple injection Previously had been resisting the idea of taking Jardiance because of recurrent vaginal irritation/candidiasis from this Cannot tolerate metformin Again her lifestyle can be better with consistent exercise and better diet  Hyperlipidemia: Will need reassessment today, consider using Livalo, discussed differences between this and other statins  PLAN:   Tresiba 40 units daily at night, given her a flowsheet with detailed explanation to adjust the dose every 3 days to bring the blood sugar down to under 130 in the morning with increments of 4 units  She will try Trulicity since appears to be covered better by insurance and Victoza needs to have a prior authorization.  However her insurance prefers Bydureon  She does need to check readings after meals to help consider mealtime insulin  Regular exercise  Consider follow-up with dietitian, needs consistent diet also  Consider Ozempic if this is covered by insurance  She'll need to check her feet regularly and have regular eye exams annually  Needs more regular follow-up   Counseling time on subjects discussed in assessment and plan sections is over 50% of today's 25 minute visit    Patient Instructions  Trulicity weekly   Tresiba insulin: This insulin provides blood sugar control for up to 24 hours.   Start with 40 units at bedtime daily and  increase by 4 units every 3 days until the waking up sugars are under 130.  Then continue the same dose.  If blood sugar is under 90 for 2 days in a row, reduce the dose by 4 units.  Note that this insulin does not control the rise of blood sugar with meals    Check blood sugars on waking up  daily  Also check blood sugars about 2 hours after a meal and do this after different meals by rotation  Recommended blood sugar levels on waking up is 90-130 and about 2 hours after meal is 130-160  Walk daily  Please bring your blood sugar monitor to each visit, thank you   Counseling time on subjects discussed above is over 50% of today's 25 minute visit     Julie Berry 07/03/2017, 1:07 PM   Note: This office note was prepared with Estate agent. Any transcriptional errors that result from this process are unintentional.  Addendum: Labs  Office Visit on 07/03/2017  Component Date Value Ref Range Status  . Hemoglobin A1C 07/03/2017 7.3   Final

## 2017-07-03 NOTE — Patient Instructions (Addendum)
Trulicity weekly   Tyler Aas insulin: This insulin provides blood sugar control for up to 24 hours.   Start with 40 units at bedtime daily and increase by 4 units every 3 days until the waking up sugars are under 130.  Then continue the same dose.  If blood sugar is under 90 for 2 days in a row, reduce the dose by 4 units.  Note that this insulin does not control the rise of blood sugar with meals    Check blood sugars on waking up  daily  Also check blood sugars about 2 hours after a meal and do this after different meals by rotation  Recommended blood sugar levels on waking up is 90-130 and about 2 hours after meal is 130-160  Walk daily  Please bring your blood sugar monitor to each visit, thank you

## 2017-07-06 ENCOUNTER — Other Ambulatory Visit: Payer: Self-pay | Admitting: Endocrinology

## 2017-07-06 MED ORDER — PITAVASTATIN CALCIUM 1 MG PO TABS: ORAL_TABLET | ORAL | 2 refills | Status: DC

## 2017-07-07 NOTE — Telephone Encounter (Signed)
Patient need a PA Dulaglutide (TRULICITY) 1.5 TC/4.8LY SOPN [[id number]],  send to [place], [place]

## 2017-07-08 ENCOUNTER — Telehealth: Payer: Self-pay | Admitting: Endocrinology

## 2017-07-08 NOTE — Telephone Encounter (Signed)
Please advise if this has been completed. Thank you!

## 2017-07-08 NOTE — Telephone Encounter (Signed)
Spoke with the patient and trulicity needs a PA to so she is going to calll the insurance company to see what is covered and call me back

## 2017-07-08 NOTE — Telephone Encounter (Signed)
Patient wants to know if preauthorization for Trulicity has been done. Patient's ph# [phone]

## 2017-07-09 NOTE — Telephone Encounter (Signed)
Called patient and she stated that this medication is covered by her insurance.   I called the pharmacy and they stated that a PA is required for this medication. This is a new medication for her.  Patient is out and needs to know what to do.  Please advise.

## 2017-07-09 NOTE — Telephone Encounter (Signed)
Need to start PA for Trulicity 1.5 mg weekly

## 2017-07-09 NOTE — Telephone Encounter (Signed)
Pt is asking for a call back before the end of today please

## 2017-07-09 NOTE — Telephone Encounter (Signed)
PA is preferred for this medication

## 2017-07-09 NOTE — Telephone Encounter (Signed)
Called the patients insurance and spoke to [place] and she is faxing [place] the PA paperwork to start the PA. She stated they do not do verbal prior authorizations.

## 2017-07-14 ENCOUNTER — Telehealth: Payer: Self-pay | Admitting: Endocrinology

## 2017-07-14 NOTE — Telephone Encounter (Signed)
PA faxed to [employer] today

## 2017-07-14 NOTE — Telephone Encounter (Signed)
Pt is not feeling well today due to high blood sugars can we please check the status of this PA

## 2017-07-15 NOTE — Telephone Encounter (Signed)
error 

## 2017-07-16 ENCOUNTER — Telehealth: Payer: Self-pay | Admitting: Endocrinology

## 2017-07-16 NOTE — Telephone Encounter (Signed)
Patient is still waiting for prior authorization for Trulicity. Out of medication. Please call patient at ph# [phone] asap to discuss options

## 2017-07-16 NOTE — Telephone Encounter (Signed)
Have you been able to start this one?

## 2017-07-16 NOTE — Telephone Encounter (Signed)
Faxed [employer] last 3 office notes and labs today

## 2017-07-17 ENCOUNTER — Telehealth: Payer: Self-pay

## 2017-07-17 MED ORDER — DULAGLUTIDE 1.5 MG/0.5ML ~~LOC~~ SOAJ: SUBCUTANEOUS | 1 refills | Status: DC

## 2017-07-17 NOTE — Telephone Encounter (Signed)
PA was approved for Trulicity until 37/29/02

## 2017-07-17 NOTE — Telephone Encounter (Signed)
PA for Trulicity was approved, and submitted.  PA for Livalo medication was initated today, they are faxing paperwork to [phone] for [place] to complete. Spoke with [employer] today 07/17/17.

## 2017-07-25 ENCOUNTER — Telehealth: Payer: Self-pay | Admitting: Endocrinology

## 2017-07-25 NOTE — Telephone Encounter (Signed)
Patient needs a suggestion on how she can lessen her nausea side effect from Trulicity. Please call patient at ph# [phone]. If she doesn't answer please leave detailed message on her voice mail.

## 2017-07-30 NOTE — Telephone Encounter (Signed)
Medication dose can be reduced to 0.75 mg weekly.  Also confirm that Ozempic was not covered by insurance?

## 2017-07-30 NOTE — Telephone Encounter (Signed)
Called patient and left a voice message to let her know the new dosage of Trulicity and to please call us back to let us know if the Ozempic is covered by her insurance.

## 2017-07-30 NOTE — Telephone Encounter (Signed)
Please advise 

## 2017-08-09 ENCOUNTER — Other Ambulatory Visit: Payer: Self-pay | Admitting: Endocrinology

## 2017-08-15 ENCOUNTER — Ambulatory Visit: Payer: PRIVATE HEALTH INSURANCE | Admitting: Endocrinology

## 2017-08-29 ENCOUNTER — Telehealth: Payer: Self-pay | Admitting: Family Medicine

## 2017-08-29 NOTE — Telephone Encounter (Signed)
Received call from RN line. Stated that patient was experiencing nausea every time she takes her Trulicity injection and that she is due for her weekly injection tonight. Advised that patient should hold off on tonight's injection and call the Endo office on Monday for further instruction about dose reduction or alternative treatment options - looks like this was advised on a prior phone note, but it is not clear if this was done by patient.  Algis Greenhouse. Jerline Pain, MD 08/29/2017 7:57 PM

## 2017-09-01 ENCOUNTER — Telehealth: Payer: Self-pay | Admitting: Endocrinology

## 2017-09-01 NOTE — Telephone Encounter (Signed)
She will need to switch from TRULICITY to Bydureon 2 mg weekly.  She may need to instructions from [place] about how to use the pen.  Also send prescription for Livalo 1 mg daily for cholesterol

## 2017-09-01 NOTE — Telephone Encounter (Signed)
Patient stated that medication Trulicity  Is making her sick, with weakness and nausea. Please advise

## 2017-09-01 NOTE — Telephone Encounter (Signed)
Pt is calling back about the medication that is making her sick  Pt is also needing her colestral medication sent over. But is not sure what the name of the medication is  Please advise.

## 2017-09-03 ENCOUNTER — Other Ambulatory Visit: Payer: Self-pay

## 2017-09-03 MED ORDER — PITAVASTATIN CALCIUM 1 MG PO TABS: ORAL_TABLET | ORAL | 2 refills | Status: DC

## 2017-09-03 MED ORDER — EXENATIDE ER 2 MG ~~LOC~~ PEN: PEN_INJECTOR | SUBCUTANEOUS | 3 refills | Status: DC

## 2017-09-03 MED ORDER — EXENATIDE ER 2 MG/0.85ML ~~LOC~~ AUIJ: 2.0000 mg | AUTO-INJECTOR | SUBCUTANEOUS | 4 refills | Status: DC

## 2017-09-03 NOTE — Telephone Encounter (Signed)
Have ordered both medications and the patient has been notified

## 2017-09-18 ENCOUNTER — Ambulatory Visit (INDEPENDENT_AMBULATORY_CARE_PROVIDER_SITE_OTHER): Payer: PRIVATE HEALTH INSURANCE | Admitting: Endocrinology

## 2017-09-18 ENCOUNTER — Encounter: Payer: Self-pay | Admitting: Endocrinology

## 2017-09-18 VITALS — BP 155/80 | HR 74 | Ht 66.0 in | Wt 288.0 lb

## 2017-09-18 DIAGNOSIS — E118 Type 2 diabetes mellitus with unspecified complications: Secondary | ICD-10-CM | POA: Diagnosis not present

## 2017-09-18 LAB — URINALYSIS, ROUTINE W REFLEX MICROSCOPIC
BILIRUBIN URINE: NEGATIVE
Hgb urine dipstick: NEGATIVE
KETONES UR: NEGATIVE
LEUKOCYTES UA: NEGATIVE
Nitrite: NEGATIVE
PH: 6.5 (ref 5.0–8.0)
Specific Gravity, Urine: 1.025 (ref 1.000–1.030)
Total Protein, Urine: NEGATIVE
URINE GLUCOSE: NEGATIVE
UROBILINOGEN UA: 1 (ref 0.0–1.0)

## 2017-09-18 MED ORDER — DULAGLUTIDE 0.75 MG/0.5ML ~~LOC~~ SOAJ: SUBCUTANEOUS | 2 refills | Status: DC

## 2017-09-18 MED ORDER — FLUVASTATIN SODIUM 20 MG PO CAPS: 20.0000 mg | ORAL_CAPSULE | Freq: Every day | ORAL | 1 refills | Status: DC

## 2017-09-18 NOTE — Progress Notes (Signed)
Patient ID: Julie Berry, female   DOB: 1981-02-28, 37 y.o.   MRN: [id number]           Reason for Appointment: follow-up for Type 2 Diabetes  Referring physician: Gerarda Fraction  History of Present Illness:          Date of diagnosis of type 2 diabetes mellitus:   11/2013       Background history:  She thinks she had blood sugars of about 300 when she was first diagnosed; although she was complaining of some fatigue she was diagnosed with routine labs on her annual visit.  Her records indicate she had an A1c of 6.3 in 12/2011 She was started on metformin twice a day but she could not tolerate it well because of diarrhea.  She thinks she tried to take it for about 6 months with some improvement in her blood sugars, subsequently stopped.  Because she had persistently high blood sugars even with starting glipizide she was started on a regimen of Victoza and Jardiance on her initial consultation in 03/2015; her average blood sugar prior to this was 250 with baseline A1c 10.6   Recent history:       Her A1c in November was relatively better at 7.3 compared to 8.3   INSULIN regimen: Tresiba 40 units daily  Non-insulin hypoglycemic regimen: None  Current blood sugar patterns from her glucose monitor download, current management and problems identified:  She was tried on Trulicity after her last visit because of difficulty with her insurance not covering Victoza.  She had been switched from 1.8 mg Victoza 1.5 mg Trulicity but she complained of persistent nausea with Trulicity and did not want to continue  However she thinks this helped her control her diet and portions and her blood sugars came down to as much as 132 in the morning  Although her insurance was recommending coverage for Bydureon this again requires prior authorization and she is not taking this also  She had candidiasis from [place] and did not want to started again  Although her Tyler Aas has been increased up to 40 units her  fasting readings are fairly consistently high recently and she has not increase this further  Her weight is about the same   Side effects from medications have been: Metfomin causes diarrhea.  Candidiasis  from [place].  Nausea from 1.5 mg Trulicity  Compliance with the medical regimen: Inconsistent Hypoglycemia: Never    Glucose monitoring:   Done about once a day    Glucometer: One Touch.       Blood Glucose readings from download:   Mean values apply above for all meters except median for One Touch  PRE-MEAL Fasting Lunch Dinner Bedtime Overall  Glucose range: 148-283       Mean/median: 216  174    98     Self-care: The diet that the patient has been following is: tries to limit sodas, fast food, fried foods .     Typical meal intake: Breakfast is mostly  cereal.  Usually eating grilled chicken at lunch and not always consistent with diet                Dietician visit, most recent: 6/15               Exercise: walking about twice a week, variable frequency  Weight history:  Wt Readings from Last 3 Encounters:  09/18/17 288 lb (130.6 kg)  07/03/17 290 lb 6.4 oz (131.7 kg)  03/07/17 290  lb 3.2 oz (131.6 kg)    Glycemic control:   Lab Results  Component Value Date   HGBA1C 7.3 07/03/2017   HGBA1C 8.3 03/07/2017   HGBA1C 7.5 11/28/2016   Lab Results  Component Value Date   MICROALBUR <0.7 11/28/2016   LDLCALC 115 (H) 07/03/2017   CREATININE 0.84 07/03/2017    Other problems discussed today: See review of systems   Allergies as of 09/18/2017      Reactions   Amoxicillin Anaphylaxis, Other (See Comments)   Has patient had a PCN reaction causing immediate rash, facial/tongue/throat swelling, SOB or lightheadedness with hypotension: Yes Has patient had a PCN reaction causing severe rash involving mucus membranes or skin necrosis: No Has patient had a PCN reaction that required hospitalization No Has patient had a PCN reaction occurring within the last 10  years: No If all of the above answers are "NO", then may proceed with Cephalosporin use.   Penicillins Anaphylaxis, Other (See Comments)   Has patient had a PCN reaction causing immediate rash, facial/tongue/throat swelling, SOB or lightheadedness with hypotension: Yes Has patient had a PCN reaction causing severe rash involving mucus membranes or skin necrosis: No Has patient had a PCN reaction that required hospitalization No Has patient had a PCN reaction occurring within the last 10 years: No If all of the above answers are "NO", then may proceed with Cephalosporin use.   Dilaudid  [hydromorphone Hcl]    Latex Rash      Medication List        Accurate as of 09/18/17 10:33 AM. Always use your most recent med list.          Dulaglutide 0.75 MG/0.5ML Sopn Commonly known as:  TRULICITY Inject in the abdominal skin as directed once a week   empagliflozin 10 MG Tabs tablet Commonly known as:  JARDIANCE Take 10 mg by mouth daily with breakfast.   Exenatide ER 2 MG/0.85ML Auij Commonly known as:  BYDUREON BCISE Inject 2 mg into the skin once a week.   fluvastatin 20 MG capsule Commonly known as:  LESCOL Take 1 capsule (20 mg total) by mouth at bedtime.   insulin degludec 100 UNIT/ML Sopn FlexTouch Pen Commonly known as:  TRESIBA FLEXTOUCH Inject 28 units once daily   liraglutide 18 MG/3ML Sopn Commonly known as:  VICTOZA INJECT 1.8MG  SUBCUTANEOUSLY ONCE DAILY AT THE SAME TIME.       Allergies:  Allergies  Allergen Reactions  . Amoxicillin Anaphylaxis and Other (See Comments)    Has patient had a PCN reaction causing immediate rash, facial/tongue/throat swelling, SOB or lightheadedness with hypotension: Yes Has patient had a PCN reaction causing severe rash involving mucus membranes or skin necrosis: No Has patient had a PCN reaction that required hospitalization No Has patient had a PCN reaction occurring within the last 10 years: No If all of the above answers are  "NO", then may proceed with Cephalosporin use.  Marland Kitchen Penicillins Anaphylaxis and Other (See Comments)    Has patient had a PCN reaction causing immediate rash, facial/tongue/throat swelling, SOB or lightheadedness with hypotension: Yes Has patient had a PCN reaction causing severe rash involving mucus membranes or skin necrosis: No Has patient had a PCN reaction that required hospitalization No Has patient had a PCN reaction occurring within the last 10 years: No If all of the above answers are "NO", then may proceed with Cephalosporin use.  . Dilaudid  [Hydromorphone Hcl]   . Latex Rash    Past Medical History:  Diagnosis Date  . Complication of anesthesia    heartrate dropped with anesthesia  . Depressed   . Diabetes mellitus without complication ([place])   . Gallstones   . HTN (hypertension)   . Hyperglycemia   . Leukocytosis   . Low back pain   . Lump or mass in breast   . Malaise and fatigue   . Morbid obesity ([place])   . Sciatica   . Sleep apnea   . UTI (urinary tract infection)     Past Surgical History:  Procedure Laterality Date  . CHOLECYSTECTOMY  06/22/2012   Procedure: LAPAROSCOPIC CHOLECYSTECTOMY;  Surgeon: Jamesetta So, MD;  Location: [facility];  Service: General;  Laterality: N/A;  . ESOPHAGOGASTRODUODENOSCOPY N/A 06/09/2014   Procedure: ESOPHAGOGASTRODUODENOSCOPY (EGD);  Surgeon: Rogene Houston, MD;  Location: [facility];  Service: Endoscopy;  Laterality: N/A;  200-moved to 145 Ann to notify pt    Family History  Problem Relation Age of Onset  . Diabetes Mother   . Rheumatic fever Mother 24  . Migraines Father 80  . Hypertension Father   . Autism Brother   . Heart disease Neg Hx     Social History:  reports that  has never smoked. she has never used smokeless tobacco. She reports that she does not drink alcohol or use drugs.    Review of Systems    Lipid history:  has Been prescribed pravastatin and she has not been taking this as prescribed, she had  muscle aches with this Not clear if she has taken any other statin drugs    Lab Results  Component Value Date   CHOL 169 07/03/2017   HDL 38.80 (L) 07/03/2017   LDLCALC 115 (H) 07/03/2017   LDLDIRECT 112.0 07/19/2016   TRIG 75.0 07/03/2017   CHOLHDL 4 07/03/2017            She was recommended 5000 units of Vitamin D3 for vitamin D deficiency   Lab Results  Component Value Date   VD25OH 23.05 (L) 07/19/2016   VD25OH 14.57 (L) 03/06/2016   Lab Results  Component Value Date   CALCIUM 9.4 07/03/2017      Physical Examination:  BP (!) 155/80 (BP Location: Left Arm, Patient Position: Sitting, Cuff Size: Large)   Pulse 74   Ht 5\' 6"  (1.676 m)   Wt 288 lb (130.6 kg)   BMI 46.48 kg/m      ASSESSMENT:  Diabetes type 2, uncontrolled with morbid obesity     See history of present illness for detailed discussion of current diabetes management, blood sugar patterns and problems identified  Blood sugars are not well controlled and she is only on basal insulin There has been consistent difficulty with getting medications approved through her insurance Although she did report better blood sugars with Trulicity 1.5 mg weekly she has had persistent nausea with this and cannot tolerate it Ozempic this not covered and also Victoza which was otherwise working well  Tends to have relatively high fasting readings but has not checked readings after meals lately   Hyperlipidemia: Will need restart her on treatment and she agrees to try Lescol 20 mg daily, [place] requires PA, previous statin drugs such as pravastatin may have caused muscle aches  PLAN:   Since Trulicity apparently is covered by her insurance she can try taking 0.75 mg weekly She will also try to check blood sugars after meals more often More regular exercise May consider doing a PA for Bydureon if she  has nausea with Trulicity  Check urinalysis for her symptoms of irritation with urination and possible  candidiasis    Patient Instructions  Check blood sugars on waking up  4/7  Also check blood sugars about 2 hours after a meal and do this after different meals by rotation  Recommended blood sugar levels on waking up is 90-130 and about 2 hours after meal is 130-160  Please bring your blood sugar monitor to each visit, thank you       Elayne Snare 09/18/2017, 10:33 AM   Note: This office note was prepared with Dragon voice recognition system technology. Any transcriptional errors that result from this process are unintentional.  Addendum: Labs  No visits with results within 1 Week(s) from this visit.  Latest known visit with results is:  Office Visit on 07/03/2017  Component Date Value Ref Range Status  . Hemoglobin A1C 07/03/2017 7.3   Final  . Sodium 07/03/2017 136  135 - 145 mEq/L Final  . Potassium 07/03/2017 3.9  3.5 - 5.1 mEq/L Final  . Chloride 07/03/2017 102  96 - 112 mEq/L Final  . CO2 07/03/2017 28  19 - 32 mEq/L Final  . Glucose, Bld 07/03/2017 184* 70 - 99 mg/dL Final  . BUN 07/03/2017 15  6 - 23 mg/dL Final  . Creatinine, Ser 07/03/2017 0.84  0.40 - 1.20 mg/dL Final  . Total Bilirubin 07/03/2017 0.4  0.2 - 1.2 mg/dL Final  . Alkaline Phosphatase 07/03/2017 58  39 - 117 U/L Final  . AST 07/03/2017 15  0 - 37 U/L Final  . ALT 07/03/2017 16  0 - 35 U/L Final  . Total Protein 07/03/2017 7.3  6.0 - 8.3 g/dL Final  . Albumin 07/03/2017 3.7  3.5 - 5.2 g/dL Final  . Calcium 07/03/2017 9.4  8.4 - 10.5 mg/dL Final  . GFR 07/03/2017 97.67  >60.00 mL/min Final  . Cholesterol 07/03/2017 169  0 - 200 mg/dL Final   ATP III Classification       Desirable:  < 200 mg/dL               Borderline High:  200 - 239 mg/dL          High:  > = 240 mg/dL  . Triglycerides 07/03/2017 75.0  0.0 - 149.0 mg/dL Final   Normal:  <150 mg/dLBorderline High:  150 - 199 mg/dL  . HDL 07/03/2017 38.80* >39.00 mg/dL Final  . VLDL 07/03/2017 15.0  0.0 - 40.0 mg/dL Final  . LDL Cholesterol  07/03/2017 115* 0 - 99 mg/dL Final  . Total CHOL/HDL Ratio 07/03/2017 4   Final                  Men          Women1/2 Average Risk     3.4          3.3Average Risk          5.0          4.42X Average Risk          9.6          7.13X Average Risk          15.0          11.0                      . NonHDL 07/03/2017 130.25   Final   NOTE:  Non-HDL goal should be 30 mg/dL  higher than patient's LDL goal (i.e. LDL goal of < 70 mg/dL, would have non-HDL goal of < 100 mg/dL)

## 2017-09-18 NOTE — Patient Instructions (Signed)
Check blood sugars on waking up  4/7  Also check blood sugars about 2 hours after a meal and do this after different meals by rotation  Recommended blood sugar levels on waking up is 90-130 and about 2 hours after meal is 130-160  Please bring your blood sugar monitor to each visit, thank you

## 2017-09-22 NOTE — Telephone Encounter (Signed)
Pt is stating that pharmacy is needing a new PA because this would be considered a new medication.     [place], [place]

## 2017-09-25 ENCOUNTER — Telehealth: Payer: Self-pay

## 2017-09-25 NOTE — Telephone Encounter (Signed)
[place] and spoke to [facility] and she stated that they only do procedure prior authorizations through the telephone and if you need a medication authorization then you have to go to [email or web address] and register to receive the correct prior authorization form for the medication for the patient. She stated that you will go to provider login and provide the provider information and go through until you get to the pharmacy pre-authorization form to print out and complete.  Can you help start this? I want to make sure someone will be able to complete this if not complete by next week. Can you help?

## 2017-09-25 NOTE — Telephone Encounter (Signed)
I will not be able to start this as I am working front desk

## 2017-09-25 NOTE — Telephone Encounter (Signed)
Called patient to make sure she still has [employer] since I do not see an updated insurance card in our system and left a voice message for her to call back to let us know.

## 2017-09-25 NOTE — Telephone Encounter (Signed)
Roger at [employer] calling about PA for Trulicity- please advise if this has been started for the patient

## 2017-09-26 ENCOUNTER — Other Ambulatory Visit: Payer: Self-pay | Admitting: Endocrinology

## 2017-09-26 NOTE — Telephone Encounter (Signed)
Dulaglutide (TRULICITY) 8.97 OE/7.8SX SOPN  [facility] is calling in regards to a PA for the above script. They are wanting to get the PA paper filled out and faxed over to them when complete.  And would also like last office visit notes.    Fax- [phone]

## 2017-09-26 NOTE — Telephone Encounter (Signed)
Patient is calling on the status of her insurance.  This patient do have [facility] it has been verified in the system. And you can see it in her Documents.  ID# [id number],  Group# [id number]

## 2017-09-29 ENCOUNTER — Telehealth: Payer: Self-pay | Admitting: Endocrinology

## 2017-09-29 NOTE — Telephone Encounter (Signed)
Can you complete this one? This goes with the previous message that I had sent to you.  Thank you!

## 2017-09-29 NOTE — Telephone Encounter (Addendum)
[facility] @ [phone] to initiate PA

## 2017-09-29 NOTE — Telephone Encounter (Signed)
comeshia from [facility] is calling on the status of PA for medication Trulicity. Please advise [phone]

## 2017-09-29 NOTE — Telephone Encounter (Signed)
Per TC on 09/25/17 PA has been iniated 09/29/17 by Cassandra L.

## 2017-09-29 NOTE — Telephone Encounter (Deleted)
Called [facility] to initiate PA. Had to leave message for return call.

## 2017-10-01 ENCOUNTER — Other Ambulatory Visit: Payer: Self-pay | Admitting: Endocrinology

## 2017-10-01 ENCOUNTER — Telehealth: Payer: Self-pay | Admitting: Internal Medicine

## 2017-10-01 NOTE — Telephone Encounter (Signed)
[facility] called and wanted to know if the paperwork for the patients insulin was received. She says she sent it over twice and wants to make sure it is getting taken care of. The patients sugar levels are affecting the patient. [phone] ext [phone]

## 2017-10-02 ENCOUNTER — Telehealth: Payer: Self-pay

## 2017-10-02 NOTE — Telephone Encounter (Signed)
Julie Berry. Spoke with [facility] on 10/01/17. Had [facility] to refax paperwork. Placed on Dr. Ronnie Derby desk for completion.

## 2017-10-02 NOTE — Telephone Encounter (Signed)
Spoke with patient and gave her advice from note below

## 2017-10-02 NOTE — Telephone Encounter (Signed)
Spoke with Levada Dy at [employer], pt is coming to get samples of Trulicity and Dr. Dwyane Dee has her form and states he will have it finished on Monday 10/06/17

## 2017-10-02 NOTE — Telephone Encounter (Signed)
[place] [facility] is calling back about  PA for Trulicity.  Needing an update.   Please advise Julie Berry   [phone] Ex [phone]

## 2017-10-02 NOTE — Telephone Encounter (Signed)
Courtney L spoke w/ [facility], had them to refax paperwork. Had Dr. Dwyane Dee to sign and faxed paperwork back to number provided.

## 2017-10-03 NOTE — Telephone Encounter (Signed)
Form was faxed today 10/03/17

## 2017-10-03 NOTE — Telephone Encounter (Signed)
Sending to you to make sure you have completed? Thank you!

## 2017-10-04 ENCOUNTER — Other Ambulatory Visit: Payer: Self-pay | Admitting: Endocrinology

## 2017-10-06 ENCOUNTER — Encounter (HOSPITAL_COMMUNITY): Payer: Self-pay | Admitting: Emergency Medicine

## 2017-10-06 ENCOUNTER — Emergency Department (HOSPITAL_COMMUNITY)
Admission: EM | Admit: 2017-10-06 | Discharge: 2017-10-06 | Disposition: A | Discharge status: Home / Self Care | Payer: PRIVATE HEALTH INSURANCE | Attending: Emergency Medicine | Admitting: Emergency Medicine

## 2017-10-06 ENCOUNTER — Emergency Department (HOSPITAL_COMMUNITY): Payer: PRIVATE HEALTH INSURANCE

## 2017-10-06 ENCOUNTER — Other Ambulatory Visit: Payer: Self-pay

## 2017-10-06 DIAGNOSIS — I10 Essential (primary) hypertension: Secondary | ICD-10-CM | POA: Insufficient documentation

## 2017-10-06 DIAGNOSIS — E119 Type 2 diabetes mellitus without complications: Secondary | ICD-10-CM | POA: Diagnosis not present

## 2017-10-06 DIAGNOSIS — R002 Palpitations: Secondary | ICD-10-CM | POA: Diagnosis not present

## 2017-10-06 LAB — CBC WITH DIFFERENTIAL/PLATELET
BASOS ABS: 0 10*3/uL (ref 0.0–0.1)
Basophils Relative: 0 %
EOS PCT: 2 %
Eosinophils Absolute: 0.2 10*3/uL (ref 0.0–0.7)
HCT: 40.8 % (ref 36.0–46.0)
Hemoglobin: 13 g/dL (ref 12.0–15.0)
Lymphocytes Relative: 20 %
Lymphs Abs: 2.4 10*3/uL (ref 0.7–4.0)
MCH: 26.2 pg (ref 26.0–34.0)
MCHC: 31.9 g/dL (ref 30.0–36.0)
MCV: 82.3 fL (ref 78.0–100.0)
MONO ABS: 0.8 10*3/uL (ref 0.1–1.0)
Monocytes Relative: 6 %
Neutro Abs: 8.9 10*3/uL — ABNORMAL HIGH (ref 1.7–7.7)
Neutrophils Relative %: 72 %
PLATELETS: 283 10*3/uL (ref 150–400)
RBC: 4.96 MIL/uL (ref 3.87–5.11)
RDW: 13.8 % (ref 11.5–15.5)
WBC: 12.3 10*3/uL — ABNORMAL HIGH (ref 4.0–10.5)

## 2017-10-06 LAB — COMPREHENSIVE METABOLIC PANEL
ALT: 16 U/L (ref 14–54)
ANION GAP: 12 (ref 5–15)
AST: 15 U/L (ref 15–41)
Albumin: 3.5 g/dL (ref 3.5–5.0)
Alkaline Phosphatase: 72 U/L (ref 38–126)
BUN: 19 mg/dL (ref 6–20)
CHLORIDE: 100 mmol/L — AB (ref 101–111)
CO2: 22 mmol/L (ref 22–32)
Calcium: 9.2 mg/dL (ref 8.9–10.3)
Creatinine, Ser: 0.78 mg/dL (ref 0.44–1.00)
GFR calc Af Amer: >60 mL/min (ref >60)
Glucose, Bld: 372 mg/dL — ABNORMAL HIGH (ref 65–99)
POTASSIUM: 3.8 mmol/L (ref 3.5–5.1)
Sodium: 134 mmol/L — ABNORMAL LOW (ref 135–145)
Total Bilirubin: 0.4 mg/dL (ref 0.3–1.2)
Total Protein: 8 g/dL (ref 6.5–8.1)

## 2017-10-06 LAB — CBG MONITORING, ED: Glucose-Capillary: 342 mg/dL — ABNORMAL HIGH (ref 65–99)

## 2017-10-06 LAB — TROPONIN I: Troponin I: <0.03 ng/mL (ref <0.03)

## 2017-10-06 LAB — TSH: TSH: 2.215 u[IU]/mL (ref 0.350–4.500)

## 2017-10-06 MED ORDER — SODIUM CHLORIDE 0.9 % IV BOLUS (SEPSIS)
1000.0000 mL | Freq: Once | INTRAVENOUS | Status: AC
  Administered 2017-10-06: 1000 mL via INTRAVENOUS

## 2017-10-06 NOTE — Discharge Instructions (Signed)
Your palpitations seem to be related to your increased dose of Tresiba. I would reduce it back and discuss with your doctor about alternative options. If you continue to have palpitations, then follow up with cardiology for consideration of holter monitor.
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
## 2017-10-06 NOTE — ED Notes (Signed)
Pt ambulated to BR

## 2017-10-06 NOTE — ED Triage Notes (Signed)
Pt states taking tresiba for sugar since November. Pt states doctor told her to increase her dosage a few days ago if her sugar is elevated. Pt states sugars have been running in upper 300s. Pt increased dosage of tresiba insulin from 40 to 45 units at night and within 12mins-1 hour notices she develops heart palpitations and SOB>

## 2017-10-07 ENCOUNTER — Other Ambulatory Visit: Payer: Self-pay

## 2017-10-07 MED ORDER — INSULIN ASPART 100 UNIT/ML FLEXPEN: PEN_INJECTOR | SUBCUTANEOUS | 3 refills | Status: DC

## 2017-10-07 NOTE — Telephone Encounter (Signed)
Patient called [facility], went back to er b/s was 37. This morning it is 305, feeling weak.  Please advise

## 2017-10-07 NOTE — Telephone Encounter (Signed)
Please find out if she is taking Trulicity.  We need to have her take this or Victoza whichever is covered. In the meantime she can increase her Tresiba insulin by 15 units Also start NovoLog insulin 10 units before every meal and see me within the next 2 weeks

## 2017-10-07 NOTE — Telephone Encounter (Signed)
LMTCB

## 2017-10-07 NOTE — Telephone Encounter (Signed)
Called the patient and gave her the new insulin instructions. She stated that she picked up some Trulicity from our office for this week and next week. I am sending in a new prescription for the Novolog to the [facility] in [place] and I am also leaving a pen in the fridge for her to pick up of the Novolog in our office in case she is not able to get from her pharmacy.  Patient was advised to call us back in the morning with her blood sugar readings and advised on different rotation sites for her insulin injections.

## 2017-10-13 ENCOUNTER — Telehealth: Payer: Self-pay | Admitting: *Deleted

## 2017-10-13 MED ORDER — INSULIN LISPRO 100 UNIT/ML ~~LOC~~ SOLN: 10.0000 [IU] | Freq: Three times a day (TID) | SUBCUTANEOUS | 3 refills | Status: DC

## 2017-10-13 NOTE — Telephone Encounter (Signed)
Received fax from pharmacy stating Novolog is not covered. They will cover Humalog. Ok to change per Dr. Dwyane Dee. New rx sent. See meds. Left detailed mess informing

## 2017-10-31 ENCOUNTER — Telehealth: Payer: Self-pay | Admitting: Endocrinology

## 2017-10-31 NOTE — Telephone Encounter (Signed)
Patient stated that medication (TRULICITY) is giving patient symtoms of a heart attack she went to see a cardiologist, he told her not to take it anymore.   She felt like her chest was caving in, chest pains

## 2017-11-02 ENCOUNTER — Emergency Department (HOSPITAL_COMMUNITY): Payer: PRIVATE HEALTH INSURANCE

## 2017-11-02 ENCOUNTER — Encounter (HOSPITAL_COMMUNITY): Payer: Self-pay | Admitting: Emergency Medicine

## 2017-11-02 ENCOUNTER — Other Ambulatory Visit: Payer: Self-pay

## 2017-11-02 ENCOUNTER — Emergency Department (HOSPITAL_COMMUNITY)
Admission: EM | Admit: 2017-11-02 | Discharge: 2017-11-02 | Disposition: A | Discharge status: Home / Self Care | Payer: PRIVATE HEALTH INSURANCE | Attending: Emergency Medicine | Admitting: Emergency Medicine

## 2017-11-02 DIAGNOSIS — E1165 Type 2 diabetes mellitus with hyperglycemia: Secondary | ICD-10-CM | POA: Insufficient documentation

## 2017-11-02 DIAGNOSIS — R739 Hyperglycemia, unspecified: Secondary | ICD-10-CM

## 2017-11-02 DIAGNOSIS — I10 Essential (primary) hypertension: Secondary | ICD-10-CM | POA: Diagnosis not present

## 2017-11-02 DIAGNOSIS — Z79899 Other long term (current) drug therapy: Secondary | ICD-10-CM | POA: Diagnosis not present

## 2017-11-02 DIAGNOSIS — R079 Chest pain, unspecified: Secondary | ICD-10-CM | POA: Insufficient documentation

## 2017-11-02 DIAGNOSIS — R42 Dizziness and giddiness: Secondary | ICD-10-CM | POA: Insufficient documentation

## 2017-11-02 DIAGNOSIS — Z91040 Latex allergy status: Secondary | ICD-10-CM | POA: Diagnosis not present

## 2017-11-02 LAB — PREGNANCY, URINE: PREG TEST UR: NEGATIVE

## 2017-11-02 LAB — CBC
HCT: 39.2 % (ref 36.0–46.0)
Hemoglobin: 12.3 g/dL (ref 12.0–15.0)
MCH: 26.4 pg (ref 26.0–34.0)
MCHC: 31.4 g/dL (ref 30.0–36.0)
MCV: 84.1 fL (ref 78.0–100.0)
PLATELETS: 267 10*3/uL (ref 150–400)
RBC: 4.66 MIL/uL (ref 3.87–5.11)
RDW: 14 % (ref 11.5–15.5)
WBC: 10 10*3/uL (ref 4.0–10.5)

## 2017-11-02 LAB — BASIC METABOLIC PANEL
ANION GAP: 10 (ref 5–15)
BUN: 13 mg/dL (ref 6–20)
CALCIUM: 8.8 mg/dL — AB (ref 8.9–10.3)
CHLORIDE: 103 mmol/L (ref 101–111)
CO2: 24 mmol/L (ref 22–32)
Creatinine, Ser: 0.83 mg/dL (ref 0.44–1.00)
GFR calc non Af Amer: >60 mL/min (ref >60)
Glucose, Bld: 203 mg/dL — ABNORMAL HIGH (ref 65–99)
Potassium: 3.3 mmol/L — ABNORMAL LOW (ref 3.5–5.1)
SODIUM: 137 mmol/L (ref 135–145)

## 2017-11-02 LAB — D-DIMER, QUANTITATIVE: D-Dimer, Quant: <0.27 ug/mL-FEU (ref 0.00–0.50)

## 2017-11-02 LAB — TROPONIN I

## 2017-11-02 NOTE — ED Notes (Signed)
EDP at bedside  

## 2017-11-02 NOTE — ED Provider Notes (Signed)
[facility] EMERGENCY DEPARTMENT Provider Note   CSN: [id number] Arrival date & time: 11/02/17  1227     History   Chief Complaint Chief Complaint  Patient presents with  . Shortness of Breath    HPI Julie Berry is a 37 y.o. female.  Patient presents with several concerns including lightheadedness, chest pain radiating to her upper back, dyspnea for 2 to 3 weeks.  All symptoms are intermittent and not associated with any particular activity.  She is obese and has diabetes, but no hypertension.  She has a primary care doctor and an endocrinologist.  She has been initiated on Trulicity in the past 2 to 3 months and she thinks that this medication is contributing to her symptoms.  Dose has recently been cut in half.  No other new medications.  Severity of symptoms is mild to moderate.  Nothing makes symptoms better or worse.      Past Medical History:  Diagnosis Date  . Complication of anesthesia    heartrate dropped with anesthesia  . Depressed   . Diabetes mellitus without complication ([place])   . Gallstones   . HTN (hypertension)   . Hyperglycemia   . Leukocytosis   . Low back pain   . Lump or mass in breast   . Malaise and fatigue   . Morbid obesity ([place])   . Sciatica   . Sleep apnea   . UTI (urinary tract infection)     Patient Active Problem List   Diagnosis Date Noted  . GERD (gastroesophageal reflux disease) 05/27/2014  . Knee pain, right 03/21/2014  . Difficulty in walking(719.7) 03/21/2014  . Weakness of left leg 03/21/2014  . OSA (obstructive sleep apnea) 01/25/2014  . NEOPLASM OF UNCERTAIN BEHAVIOR OF SKIN 05/01/2009  . DYSURIA 04/13/2009  . 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 LOW HDL 09/30/2008  . ADULT SEXUAL ABUSE NEC 06/16/2008  . HYPERTENSION 02/26/2007  . HYPERGLYCEMIA 02/26/2007  . MORBID OBESITY 02/13/2007  . DEPRESSION 02/13/2007    Past Surgical History:  Procedure Laterality Date  . CHOLECYSTECTOMY   06/22/2012   Procedure: LAPAROSCOPIC CHOLECYSTECTOMY;  Surgeon: Jamesetta So, MD;  Location: [facility] ORS;  Service: General;  Laterality: N/A;  . ESOPHAGOGASTRODUODENOSCOPY N/A 06/09/2014   Procedure: ESOPHAGOGASTRODUODENOSCOPY (EGD);  Surgeon: Rogene Houston, MD;  Location: [facility] ENDO SUITE;  Service: Endoscopy;  Laterality: N/A;  200-moved to 145 Ann to notify pt    OB History    No data available       Home Medications    Prior to Admission medications   Medication Sig Start Date End Date Taking? Authorizing Provider  Dulaglutide (TRULICITY) 4.09 WJ/1.9JY SOPN Inject in the abdominal skin as directed once a week 09/18/17  Yes Elayne Snare, MD  fluvastatin (LESCOL) 20 MG capsule Take 1 capsule (20 mg total) by mouth at bedtime. 09/18/17  Yes Elayne Snare, MD  TRESIBA FLEXTOUCH 100 UNIT/ML SOPN FlexTouch Pen INJECT 28 UNITS SUBCUTANEOUSLY DAILY. Patient taking differently: Inject 40-50 units once daily 10/02/17  Yes Elayne Snare, MD  insulin lispro (HUMALOG) 100 UNIT/ML injection Inject 0.1 mLs (10 Units total) into the skin 3 (three) times daily before meals. Patient not taking: Reported on 11/02/2017 10/13/17   Elayne Snare, MD    Family History Family History  Problem Relation Age of Onset  . Diabetes Mother   . Rheumatic fever Mother 1  . Migraines Father 12  . Hypertension Father   . Autism Brother   .  Heart disease Neg Hx     Social History Social History   Tobacco Use  . Smoking status: Never Smoker  . Smokeless tobacco: Never Used  Substance Use Topics  . Alcohol use: No  . Drug use: No     Allergies   Amoxicillin; Penicillins; Dilaudid  [hydromorphone hcl]; and Latex   Review of Systems Review of Systems  All other systems reviewed and are negative.    Physical Exam Updated Vital Signs BP 125/67 (BP Location: Right Arm)   Pulse 68   Temp 98.4 F (36.9 C) (Oral)   Resp 17   Ht 5\' 6"  (1.676 m)   Wt 133.8 kg (295 lb)   LMP 10/05/2017   SpO2 100%   BMI  47.61 kg/m   Physical Exam  Constitutional: She is oriented to person, place, and time. She appears well-developed and well-nourished.  High bmi  HENT:  Head: Normocephalic and atraumatic.  Eyes: Conjunctivae are normal.  Neck: Neck supple.  Cardiovascular: Normal rate and regular rhythm.  Pulmonary/Chest: Effort normal and breath sounds normal.  Abdominal: Soft. Bowel sounds are normal.  Musculoskeletal: Normal range of motion.  Neurological: She is alert and oriented to person, place, and time.  Skin: Skin is warm and dry.  Psychiatric: She has a normal mood and affect. Her behavior is normal.  Nursing note and vitals reviewed.    ED Treatments / Results  Labs (all labs ordered are listed, but only abnormal results are displayed) Labs Reviewed  BASIC METABOLIC PANEL - Abnormal; Notable for the following components:      Result Value   Potassium 3.3 (*)    Glucose, Bld 203 (*)    Calcium 8.8 (*)    All other components within normal limits  CBC  TROPONIN I  PREGNANCY, URINE  D-DIMER, QUANTITATIVE (NOT AT [facility])    EKG  EKG Interpretation  Date/Time:  Sunday November 02 2017 12:43:23 EDT Ventricular Rate:  87 PR Interval:  164 QRS Duration: 72 QT Interval:  352 QTC Calculation: 423 R Axis:   30 Text Interpretation:  Normal sinus rhythm Low voltage QRS Cannot rule out Anterior infarct , age undetermined Abnormal ECG Confirmed by Nat Christen [phone]) on 11/02/2017 5:04:30 PM Also confirmed by Nat Christen [phone])  on 11/02/2017 5:48:09 PM       Radiology Dg Chest 2 View  Result Date: 11/02/2017 CLINICAL DATA:  Chest pain and shortness of breath. Palpitations and dizziness. EXAM: CHEST - 2 VIEW COMPARISON:  10/06/2017. FINDINGS: The heart size and mediastinal contours are within normal limits. Both lungs are clear. The visualized skeletal structures are unremarkable. IMPRESSION: No active cardiopulmonary disease.  Stable exam. Electronically Signed   By: Staci Righter M.D.    On: 11/02/2017 13:48    Procedures Procedures (including critical care time)  Medications Ordered in ED Medications - No data to display   Initial Impression / Assessment and Plan / ED Course  I have reviewed the triage vital signs and the nursing notes.  Pertinent labs & imaging results that were available during my care of the patient were reviewed by me and considered in my medical decision making (see chart for details).     Patient is nontoxic-appearing.  Her blood pressure is normal or actually slightly low.  Work-up including labs, troponin, d-dimer, chest x-ray, EKG, chest x-ray all negative for acute findings.  Will recommend discontinuation of Trulicity and slightly increased [place].  Patient will follow-up with her endocrinologist.  Final Clinical Impressions(s) /  ED Diagnoses   Final diagnoses:  Chest pain, unspecified type  Light headedness  Hyperglycemia    ED Discharge Orders    None       Nat Christen, MD 11/02/17 1821

## 2017-11-02 NOTE — Discharge Instructions (Signed)
Recommend discontinuation Trulicity.  You can increase your Tresiba to 45 units daily.  Follow-up with your endocrinologist.

## 2017-11-02 NOTE — ED Triage Notes (Addendum)
Patient c/o shortness of breath with palpitations and dizziness. Per patient seen here in ER 2 weeks ago for similar symptoms after starting Trulicity. Patient instructed to follow-up with specialist, went to she cardiologist Thursday and is scheduled to have Echo an d stress test. Per patient was told possible hear damage due to reaction to Trulicity.  Patient states last dose of Trulicity was Wednesday.

## 2017-11-03 NOTE — Telephone Encounter (Signed)
She will have to go back to 1.8 mg daily.  Please do prior authorization if needed

## 2017-11-04 MED ORDER — LIRAGLUTIDE 18 MG/3ML ~~LOC~~ SOPN: PEN_INJECTOR | SUBCUTANEOUS | 0 refills | Status: DC

## 2017-11-04 NOTE — Telephone Encounter (Signed)
Pt informed of below. See meds.

## 2017-11-13 ENCOUNTER — Ambulatory Visit: Payer: PRIVATE HEALTH INSURANCE | Admitting: Endocrinology

## 2017-11-14 ENCOUNTER — Ambulatory Visit: Payer: PRIVATE HEALTH INSURANCE | Admitting: Cardiology

## 2017-11-16 ENCOUNTER — Other Ambulatory Visit: Payer: Self-pay | Admitting: Endocrinology

## 2017-11-17 ENCOUNTER — Telehealth: Payer: Self-pay | Admitting: Endocrinology

## 2017-11-17 NOTE — Telephone Encounter (Signed)
Patient stated her insurance faxed over a paper for a PA over a week ago  And She is following up on the status of this paper. States she is out of this medication .      liraglutide (VICTOZA) 18 MG/3ML SOPN

## 2017-11-18 ENCOUNTER — Telehealth: Payer: Self-pay | Admitting: Endocrinology

## 2017-11-18 NOTE — Telephone Encounter (Signed)
Judson Roch calling from cover my meds regarding PA on medication,  please advise [phone] ref # [id number]  Called [facility]

## 2017-11-19 NOTE — Telephone Encounter (Signed)
Started PA for victoza [phone]

## 2017-11-20 ENCOUNTER — Telehealth: Payer: Self-pay | Admitting: Endocrinology

## 2017-11-20 NOTE — Telephone Encounter (Signed)
error 

## 2017-11-21 ENCOUNTER — Ambulatory Visit: Payer: PRIVATE HEALTH INSURANCE | Admitting: Cardiology

## 2017-11-25 ENCOUNTER — Telehealth: Payer: Self-pay | Admitting: Endocrinology

## 2017-11-25 NOTE — Telephone Encounter (Signed)
Error

## 2017-11-26 ENCOUNTER — Ambulatory Visit: Payer: PRIVATE HEALTH INSURANCE | Admitting: Endocrinology

## 2017-11-27 ENCOUNTER — Ambulatory Visit (INDEPENDENT_AMBULATORY_CARE_PROVIDER_SITE_OTHER): Payer: PRIVATE HEALTH INSURANCE | Admitting: Endocrinology

## 2017-11-27 ENCOUNTER — Encounter: Payer: Self-pay | Admitting: Endocrinology

## 2017-11-27 VITALS — BP 124/76 | HR 82 | Ht 66.0 in | Wt 290.4 lb

## 2017-11-27 DIAGNOSIS — E782 Mixed hyperlipidemia: Secondary | ICD-10-CM

## 2017-11-27 DIAGNOSIS — E118 Type 2 diabetes mellitus with unspecified complications: Secondary | ICD-10-CM | POA: Diagnosis not present

## 2017-11-27 LAB — LIPID PANEL
Cholesterol: 175 mg/dL (ref 0–200)
HDL: 38.6 mg/dL — ABNORMAL LOW (ref >39.00)
LDL Cholesterol: 122 mg/dL — ABNORMAL HIGH (ref 0–99)
NonHDL: 136.53
TRIGLYCERIDES: 75 mg/dL (ref 0.0–149.0)
Total CHOL/HDL Ratio: 5
VLDL: 15 mg/dL (ref 0.0–40.0)

## 2017-11-27 LAB — COMPREHENSIVE METABOLIC PANEL
ALK PHOS: 64 U/L (ref 39–117)
ALT: 15 U/L (ref 0–35)
AST: 11 U/L (ref 0–37)
Albumin: 3.7 g/dL (ref 3.5–5.2)
BILIRUBIN TOTAL: 0.3 mg/dL (ref 0.2–1.2)
BUN: 18 mg/dL (ref 6–23)
CALCIUM: 9 mg/dL (ref 8.4–10.5)
CO2: 29 mEq/L (ref 19–32)
Chloride: 103 mEq/L (ref 96–112)
Creatinine, Ser: 0.95 mg/dL (ref 0.40–1.20)
GFR: 84.55 mL/min (ref >60.00)
GLUCOSE: 250 mg/dL — AB (ref 70–99)
Potassium: 4.2 mEq/L (ref 3.5–5.1)
Sodium: 138 mEq/L (ref 135–145)
TOTAL PROTEIN: 7.5 g/dL (ref 6.0–8.3)

## 2017-11-27 LAB — MICROALBUMIN / CREATININE URINE RATIO
Creatinine,U: 138.8 mg/dL
Microalb Creat Ratio: 0.7 mg/g (ref 0.0–30.0)
Microalb, Ur: 1 mg/dL (ref 0.0–1.9)

## 2017-11-27 LAB — GLUCOSE, POCT (MANUAL RESULT ENTRY): POC GLUCOSE: 246 mg/dL — AB (ref 70–99)

## 2017-11-27 LAB — POCT GLYCOSYLATED HEMOGLOBIN (HGB A1C): Hemoglobin A1C: 9.1

## 2017-11-27 MED ORDER — INSULIN REGULAR HUMAN (CONC) 500 UNIT/ML ~~LOC~~ SOPN: PEN_INJECTOR | SUBCUTANEOUS | 0 refills | Status: DC

## 2017-11-27 NOTE — Patient Instructions (Signed)
Tresiba after 2 days cut to 30 and stop when am sugar < 150  Humulin R  16 U, 30 min before meals  Go up 2 units every 3 days until sugars are 150 or less  Start VICTOZA injection as shown once daily at the same time of the day.   Dial the dose to 0.6 mg on the pen for the first 3-4 days.   You may inject in the stomach, thigh or arm. You may experience nausea in the first few days which usually goes away.  You will feel fullness of the stomach with starting the medication and should try to keep the portions at meals small.  After that  increase the dose to 1.2mg  daily if no nausea present.    If any questions or concerns are present call the office or the [place] helpline at [phone]. Visit [email or web address].info/ for more useful information

## 2017-11-27 NOTE — Progress Notes (Addendum)
Patient ID: Julie Berry, female   DOB: February 17, 1981, 37 y.o.   MRN: [id number]           Reason for Appointment: follow-up for Type 2 Diabetes  Referring physician: Gerarda Fraction  History of Present Illness:          Date of diagnosis of type 2 diabetes mellitus:   11/2013       Background history:  She thinks she had blood sugars of about 300 when she was first diagnosed; although she was complaining of some fatigue she was diagnosed with routine labs on her annual visit.  Her records indicate she had an A1c of 6.3 in 12/2011 She was started on metformin twice a day but she could not tolerate it well because of diarrhea.  She thinks she tried to take it for about 6 months with some improvement in her blood sugars, subsequently stopped.  Because she had persistently high blood sugars even with starting glipizide she was started on a regimen of Victoza and Jardiance on her initial consultation in 03/2015; her average blood sugar prior to this was 250 with baseline A1c 10.6   Recent history:       Her A1c in November was relatively better at 7.3 but is now up to 9.1 which is about her highest since 2016   INSULIN regimen: Tresiba 40 units daily  Non-insulin hypoglycemic regimen: None  Current blood sugar patterns from her glucose monitor download, current management and problems identified:  She was tried on Trulicity again on her last visit because of difficulty with her insurance not covering Victoza.    She had previously tolerated Victoza well  However she started feeling weak and nauseated and also complaining of fluttering in her chest with taking Trulicity for 3 weeks and stopped it.  However it appears that her blood sugars were better and she had decreased portion intake with taking Trulicity; blood sugars are mostly below 200 and as low as 129  She still has not been able to get Victoza approved from her insurance and is only taking TRESIBA  She was supposed to start mealtime  insulin with Humalog also but apparently this was also rejected by her insurance and not clear why she did not get started on an alternative  With this her blood sugars have been progressively higher and frequently over 300 now  She is concerned about her lack of weight loss, she thinks she is watching her diet and sweets well and trying to walk   Side effects from medications have been: Metfomin causes diarrhea.  Candidiasis  from [place].  Nausea, palpitations from Trulicity  Compliance with the medical regimen: Improving Hypoglycemia: Never    Glucose monitoring:   Done about once a day    Glucometer: One Touch.       Blood Glucose readings from download:   Mean values apply above for all meters except median for One Touch  PRE-MEAL Fasting Lunch Dinner Bedtime Overall  Glucose range:  156-329    129-403  129-403  Mean/median:  252    275  256    Self-care: The diet that the patient has been following is: tries to limit sodas, fast food, fried foods .     Typical meal intake: Breakfast is mostly cereal.  Usually eating grilled chicken at lunch and not always consistent with diet                Dietician visit, most recent: 6/15  Exercise: walking about twice a week,   Weight history:  Wt Readings from Last 3 Encounters:  11/27/17 290 lb 6.4 oz (131.7 kg)  11/02/17 295 lb (133.8 kg)  09/18/17 288 lb (130.6 kg)    Glycemic control:   Lab Results  Component Value Date   HGBA1C 9.1 11/27/2017   HGBA1C 7.3 07/03/2017   HGBA1C 8.3 03/07/2017   Lab Results  Component Value Date   MICROALBUR <0.7 11/28/2016   [place] 115 (H) 07/03/2017   CREATININE 0.83 11/02/2017    Other problems discussed today: See review of systems   Allergies as of 11/27/2017      Reactions   Amoxicillin Anaphylaxis, Other (See Comments)   Has patient had a PCN reaction causing immediate rash, facial/tongue/throat swelling, SOB or lightheadedness with hypotension: Yes Has  patient had a PCN reaction causing severe rash involving mucus membranes or skin necrosis: No Has patient had a PCN reaction that required hospitalization No Has patient had a PCN reaction occurring within the last 10 years: No If all of the above answers are "NO", then may proceed with Cephalosporin use.   Penicillins Anaphylaxis, Other (See Comments)   Has patient had a PCN reaction causing immediate rash, facial/tongue/throat swelling, SOB or lightheadedness with hypotension: Yes Has patient had a PCN reaction causing severe rash involving mucus membranes or skin necrosis: No Has patient had a PCN reaction that required hospitalization No Has patient had a PCN reaction occurring within the last 10 years: No If all of the above answers are "NO", then may proceed with Cephalosporin use.   Dilaudid  [hydromorphone Hcl]    Latex Rash   Trulicity [dulaglutide] Nausea Only, Palpitations      Medication List        Accurate as of 11/27/17 10:37 AM. Always use your most recent med list.          fluvastatin 20 MG capsule Commonly known as:  LESCOL Take 1 capsule (20 mg total) by mouth at bedtime.   insulin degludec 100 UNIT/ML Sopn FlexTouch Pen Commonly known as:  TRESIBA FLEXTOUCH Inject 40 units once daily   insulin regular human CONCENTRATED 500 UNIT/ML kwikpen Commonly known as:  HUMULIN R U-500 KWIKPEN 15 Units, 30 minutes before each meal   liraglutide 18 MG/3ML Sopn Commonly known as:  VICTOZA INJECT 1.8MG  SUBCUTANEOUSLY ONCE DAILY AT THE SAME TIME.       Allergies:  Allergies  Allergen Reactions  . Amoxicillin Anaphylaxis and Other (See Comments)    Has patient had a PCN reaction causing immediate rash, facial/tongue/throat swelling, SOB or lightheadedness with hypotension: Yes Has patient had a PCN reaction causing severe rash involving mucus membranes or skin necrosis: No Has patient had a PCN reaction that required hospitalization No Has patient had a PCN  reaction occurring within the last 10 years: No If all of the above answers are "NO", then may proceed with Cephalosporin use.  Marland Kitchen Penicillins Anaphylaxis and Other (See Comments)    Has patient had a PCN reaction causing immediate rash, facial/tongue/throat swelling, SOB or lightheadedness with hypotension: Yes Has patient had a PCN reaction causing severe rash involving mucus membranes or skin necrosis: No Has patient had a PCN reaction that required hospitalization No Has patient had a PCN reaction occurring within the last 10 years: No If all of the above answers are "NO", then may proceed with Cephalosporin use.  . Dilaudid  [Hydromorphone Hcl]   . Latex Rash  . Trulicity [Dulaglutide] Nausea Only  and Palpitations    Past Medical History:  Diagnosis Date  . Complication of anesthesia    heartrate dropped with anesthesia  . Depressed   . Diabetes mellitus without complication ([place])   . Gallstones   . HTN (hypertension)   . Hyperglycemia   . Leukocytosis   . Low back pain   . Lump or mass in breast   . Malaise and fatigue   . Morbid obesity ([place])   . Sciatica   . Sleep apnea   . UTI (urinary tract infection)     Past Surgical History:  Procedure Laterality Date  . CHOLECYSTECTOMY  06/22/2012   Procedure: LAPAROSCOPIC CHOLECYSTECTOMY;  Surgeon: Jamesetta So, MD;  Location: [facility] ORS;  Service: General;  Laterality: N/A;  . ESOPHAGOGASTRODUODENOSCOPY N/A 06/09/2014   Procedure: ESOPHAGOGASTRODUODENOSCOPY (EGD);  Surgeon: Rogene Houston, MD;  Location: [facility];  Service: Endoscopy;  Laterality: N/A;  200-moved to 145 Ann to notify pt    Family History  Problem Relation Age of Onset  . Diabetes Mother   . Rheumatic fever Mother 36  . Migraines Father 53  . Hypertension Father   . Autism Brother   . Heart disease Neg Hx     Social History:  reports that she has never smoked. She has never used smokeless tobacco. She reports that she does not drink alcohol or use  drugs.    Review of Systems    Lipid history:  has had muscle aches with pravastatin is now taking fluvastatin 20 mg without any difficulty Baseline LDL was 115    Lab Results  Component Value Date   CHOL 169 07/03/2017   HDL 38.80 (L) 07/03/2017   LDLCALC 115 (H) 07/03/2017   LDLDIRECT 112.0 07/19/2016   TRIG 75.0 07/03/2017   CHOLHDL 4 07/03/2017            She was recommended 5000 units of Vitamin D3 for vitamin D deficiency   Lab Results  Component Value Date   VD25OH 23.05 (L) 07/19/2016   VD25OH 14.57 (L) 03/06/2016   Lab Results  Component Value Date   CALCIUM 8.8 (L) 11/02/2017    She was told to have a leaky heart valve with cardiologist but no other problems   Physical Examination:  BP 124/76 (BP Location: Right Arm, Patient Position: Sitting, Cuff Size: Large)   Pulse 82   Ht 5\' 6"  (1.676 m)   Wt 290 lb 6.4 oz (131.7 kg)   SpO2 98%   BMI 46.87 kg/m      ASSESSMENT:  Diabetes type 2, uncontrolled with morbid obesity     See history of present illness for detailed discussion of current diabetes management, blood sugar patterns and problems identified  Blood sugars are still not well controlled and she is only on basal insulin Although she did see better blood sugars with Trulicity she could not tolerated Again having difficulty getting Victoza approved through her insurance Her blood sugars are running close to 300 frequently and averaging well over 200 now including fasting  She still has difficulty losing weight despite trying to watch her diet generally and walking   HYPERCHOLESTEROLEMIA: She is tolerating fluvastatin and will check her lipids again today  OBESITY: She has difficulty losing weight may consider adding Topamax and phentermine combination low doses  PLAN:   Since she is insulin resistant she may respond better to U-500 insulin compared to [place] Described as the differences between this and normal insulin Discussed onset  and  duration of action of this insulin and timing of taking this She will start taking 15-16 units before each meal and increase the dose by 2 units every 3 days until blood sugars are consistently around 150 or below Co-pay card and patient information brochure given  She will start tapering down her [place] after 3 days and also stop this when blood sugars are below 150 Most likely she will also be able to get better control with less insulin starting with Victoza once it is available She can start with 0.6 mg and take 1.2 after a few days if tolerated Regular exercise recommended Follow-up in 4 weeks to make sure she is at target   Patient Instructions  Tresiba after 2 days cut to 30 and stop when am sugar < 150  Humulin R  16 U, 30 min before meals  Go up 2 units every 3 days until sugars are 150 or less  Start VICTOZA injection as shown once daily at the same time of the day.   Dial the dose to 0.6 mg on the pen for the first 3-4 days.   You may inject in the stomach, thigh or arm. You may experience nausea in the first few days which usually goes away.  You will feel fullness of the stomach with starting the medication and should try to keep the portions at meals small.  After that  increase the dose to 1.2mg  daily if no nausea present.    If any questions or concerns are present call the office or the [place] helpline at [phone]. Visit [email or web address].info/ for more useful information       Elayne Snare 11/27/2017, 10:37 AM   Note: This office note was prepared with Dragon voice recognition system technology. Any transcriptional errors that result from this process are unintentional.  Addendum: Her Lescol will be changed to the Lescol XL because of high LDL

## 2017-11-28 ENCOUNTER — Telehealth: Payer: Self-pay

## 2017-11-28 NOTE — Telephone Encounter (Signed)
PA for Victoza has been approved through 4/20

## 2017-12-01 ENCOUNTER — Other Ambulatory Visit: Payer: Self-pay | Admitting: *Deleted

## 2017-12-01 MED ORDER — FLUVASTATIN SODIUM ER 80 MG PO TB24: 80.0000 mg | ORAL_TABLET | Freq: Every day | ORAL | 1 refills | Status: DC

## 2017-12-28 NOTE — Progress Notes (Signed)
Patient ID: Julie Berry, female   DOB: 04/13/1981, 37 y.o.   MRN: [id number]           Reason for Appointment: follow-up for Type 2 Diabetes  Referring physician: Gerarda Fraction  History of Present Illness:          Date of diagnosis of type 2 diabetes mellitus:   11/2013       Background history:  She thinks she had blood sugars of about 300 when she was first diagnosed; although she was complaining of some fatigue she was diagnosed with routine labs on her annual visit.  Her records indicate she had an A1c of 6.3 in 12/2011 She was started on metformin twice a day but she could not tolerate it well because of diarrhea.  She thinks she tried to take it for about 6 months with some improvement in her blood sugars, subsequently stopped.  Because she had persistently high blood sugars even with starting glipizide she was started on a regimen of Victoza and Jardiance on her initial consultation in 03/2015; her average blood sugar prior to this was 250 with baseline A1c 10.6   Recent history:       Her A1c in November was relatively better at 7.3 but is recently up to 9.1 which is about her highest since 2016   INSULIN regimen: Tresiba 0 units daily, Humulin R 15 units 3 times daily  Non-insulin hypoglycemic regimen: None  Current blood sugar patterns from her glucose monitor download, current management and problems identified:  She was started on Humulin RU-500 insulin on her last visit  Although she was told to only start cutting back on TRESIBA once her blood sugars were below 150 in the morning she stopped it abruptly and blood sugars are about the same as before  She was also able to get Victoza started and has increased up to 1.8 mg, she thinks she is tolerating this better than Trulicity  However she is still complaining of feeling weak to function well at times having a little nausea  As before her blood sugars are averaging in the mid 200s throughout the day with slightly higher  readings later in the day  She is eating small portions and occasionally may skip a meal  She is trying to take her U-500 insulin 30 meals before eating but has not increased the dose despite blood sugar being well over 200 most of the time  Still has not lost any weight   Side effects from medications have been: Metfomin causes diarrhea.  Candidiasis  from [place].  Nausea, palpitations from Trulicity  Compliance with the medical regimen: Improving Hypoglycemia: Never    Glucose monitoring:   Done about once a day    Glucometer: One Touch.       Blood Glucose readings from download:   Mean values apply above for all meters except median for One Touch  PRE-MEAL Fasting Lunch Dinner Bedtime Overall  Glucose range:  197-454  254-353     Mean/median:  240    250 256   POST-MEAL PC Breakfast PC Lunch PC Dinner  Glucose range:    176-391  Mean/median:    254     Self-care: The diet that the patient has been following is: tries to limit sodas, fast food, fried foods .     Typical meal intake: Breakfast is mostly cereal.  Usually eating grilled chicken at lunch and not always consistent with diet  Dietician visit, most recent: 6/15               Exercise: walking about 1-2 twice a week,  Weight history:  Wt Readings from Last 3 Encounters:  12/29/17 293 lb 12.8 oz (133.3 kg)  11/27/17 290 lb 6.4 oz (131.7 kg)  11/02/17 295 lb (133.8 kg)    Glycemic control:   Lab Results  Component Value Date   HGBA1C 9.1 11/27/2017   HGBA1C 7.3 07/03/2017   HGBA1C 8.3 03/07/2017   Lab Results  Component Value Date   MICROALBUR 1.0 11/27/2017   LDLCALC 122 (H) 11/27/2017   CREATININE 0.95 11/27/2017    Other problems discussed today: See review of systems   Allergies as of 12/29/2017      Reactions   Amoxicillin Anaphylaxis, Other (See Comments)   Has patient had a PCN reaction causing immediate rash, facial/tongue/throat swelling, SOB or lightheadedness  with hypotension: Yes Has patient had a PCN reaction causing severe rash involving mucus membranes or skin necrosis: No Has patient had a PCN reaction that required hospitalization No Has patient had a PCN reaction occurring within the last 10 years: No If all of the above answers are "NO", then may proceed with Cephalosporin use.   Penicillins Anaphylaxis, Other (See Comments)   Has patient had a PCN reaction causing immediate rash, facial/tongue/throat swelling, SOB or lightheadedness with hypotension: Yes Has patient had a PCN reaction causing severe rash involving mucus membranes or skin necrosis: No Has patient had a PCN reaction that required hospitalization No Has patient had a PCN reaction occurring within the last 10 years: No If all of the above answers are "NO", then may proceed with Cephalosporin use.   Dilaudid  [hydromorphone Hcl]    Latex Rash   Trulicity [dulaglutide] Nausea Only, Palpitations      Medication List        Accurate as of 12/29/17 11:41 AM. Always use your most recent med list.          fluvastatin XL 80 MG 24 hr tablet Commonly known as:  LESCOL XL Take 1 tablet (80 mg total) by mouth at bedtime.   insulin regular human CONCENTRATED 500 UNIT/ML kwikpen Commonly known as:  HUMULIN R U-500 KWIKPEN 15 Units, 30 minutes before each meal   liraglutide 18 MG/3ML Sopn Commonly known as:  VICTOZA INJECT 1.8MG  SUBCUTANEOUSLY ONCE DAILY AT THE SAME TIME.       Allergies:  Allergies  Allergen Reactions  . Amoxicillin Anaphylaxis and Other (See Comments)    Has patient had a PCN reaction causing immediate rash, facial/tongue/throat swelling, SOB or lightheadedness with hypotension: Yes Has patient had a PCN reaction causing severe rash involving mucus membranes or skin necrosis: No Has patient had a PCN reaction that required hospitalization No Has patient had a PCN reaction occurring within the last 10 years: No If all of the above answers are "NO",  then may proceed with Cephalosporin use.  Marland Kitchen Penicillins Anaphylaxis and Other (See Comments)    Has patient had a PCN reaction causing immediate rash, facial/tongue/throat swelling, SOB or lightheadedness with hypotension: Yes Has patient had a PCN reaction causing severe rash involving mucus membranes or skin necrosis: No Has patient had a PCN reaction that required hospitalization No Has patient had a PCN reaction occurring within the last 10 years: No If all of the above answers are "NO", then may proceed with Cephalosporin use.  . Dilaudid  [Hydromorphone Hcl]   . Latex Rash  .  Trulicity [Dulaglutide] Nausea Only and Palpitations    Past Medical History:  Diagnosis Date  . Complication of anesthesia    heartrate dropped with anesthesia  . Depressed   . Diabetes mellitus without complication ([place])   . Gallstones   . HTN (hypertension)   . Hyperglycemia   . Leukocytosis   . Low back pain   . Lump or mass in breast   . Malaise and fatigue   . Morbid obesity ([place])   . Sciatica   . Sleep apnea   . UTI (urinary tract infection)     Past Surgical History:  Procedure Laterality Date  . CHOLECYSTECTOMY  06/22/2012   Procedure: LAPAROSCOPIC CHOLECYSTECTOMY;  Surgeon: Jamesetta So, MD;  Location: [facility];  Service: General;  Laterality: N/A;  . ESOPHAGOGASTRODUODENOSCOPY N/A 06/09/2014   Procedure: ESOPHAGOGASTRODUODENOSCOPY (EGD);  Surgeon: Rogene Houston, MD;  Location: [facility];  Service: Endoscopy;  Laterality: N/A;  200-moved to 145 Ann to notify pt    Family History  Problem Relation Age of Onset  . Diabetes Mother   . Rheumatic fever Mother 63  . Migraines Father 55  . Hypertension Father   . Autism Brother   . Heart disease Neg Hx     Social History:  reports that she has never smoked. She has never used smokeless tobacco. She reports that she does not drink alcohol or use drugs.    Review of Systems    Lipid history:  has had muscle aches with  pravastatin is being prescribed fluvastatin 20 mg However has not been regular with her prescription as of 4/19 Baseline LDL was 115    Lab Results  Component Value Date   CHOL 175 11/27/2017   HDL 38.60 (L) 11/27/2017   LDLCALC 122 (H) 11/27/2017   LDLDIRECT 112.0 07/19/2016   TRIG 75.0 11/27/2017   CHOLHDL 5 11/27/2017            She was recommended 5000 units of Vitamin D3 for vitamin D deficiency   Lab Results  Component Value Date   VD25OH 23.05 (L) 07/19/2016   VD25OH 14.57 (L) 03/06/2016   Lab Results  Component Value Date   CALCIUM 9.0 11/27/2017       Physical Examination:  BP 122/78 (BP Location: Left Arm, Patient Position: Sitting, Cuff Size: Large)   Pulse 90   Ht 5\' 6"  (1.676 m)   Wt 293 lb 12.8 oz (133.3 kg)   SpO2 98%   BMI 47.42 kg/m      ASSESSMENT:  Diabetes type 2, uncontrolled with morbid obesity     See history of present illness for detailed discussion of current diabetes management, blood sugar patterns and problems identified  Despite starting Victoza and switching to Humulin U-500 insulin her blood sugars are not improved at all However she stopped her [place] by mistake and was told to not stop it until morning sugars were below 150  As before blood sugars are averaging over 200 at all times of the day and slightly higher nonfasting He may not be able to tolerate full dose of Victoza because of nonspecific weakness and nausea Currently not exercising much  HYPERCHOLESTEROLEMIA: Needs follow-up labs, she has been told to take the prescription regularly   PLAN:   Since she is having high blood sugars fasting she will go back to 40 units of Tresiba as before Given instructions on how to use a flowsheet for titrating the [place] based on fasting blood sugar To be  done by 4 units every 3 days until blood sugars are between about 110-150 in the morning and may go down if needed She will increase her Humulin by 5 units at breakfast  and lunch and 10 units at dinner Reduce Victoza to 1.2 for now  Follow-up in 6 weeks Reviewed blood sugars by phone next week   Patient Instructions  Humulin U-500, 20 units--20--25 before supper  Tresiba 40 units 1x daily  Victoza 1.2mg        Elayne Snare 12/29/2017, 11:41 AM   Note: This office note was prepared with Dragon voice recognition system technology. Any transcriptional errors that result from this process are unintentional.  Addendum: Her Lescol will be changed to the Lescol XL because of high LDL

## 2017-12-29 ENCOUNTER — Ambulatory Visit (INDEPENDENT_AMBULATORY_CARE_PROVIDER_SITE_OTHER): Payer: PRIVATE HEALTH INSURANCE | Admitting: Endocrinology

## 2017-12-29 ENCOUNTER — Encounter: Payer: Self-pay | Admitting: Endocrinology

## 2017-12-29 VITALS — BP 122/78 | HR 90 | Ht 66.0 in | Wt 293.8 lb

## 2017-12-29 DIAGNOSIS — Z794 Long term (current) use of insulin: Secondary | ICD-10-CM | POA: Diagnosis not present

## 2017-12-29 DIAGNOSIS — E1165 Type 2 diabetes mellitus with hyperglycemia: Secondary | ICD-10-CM

## 2017-12-29 NOTE — Patient Instructions (Addendum)
Humulin U-500, 20 units--20--25 before supper  Tresiba 40 units 1x daily  Victoza 1.2mg 

## 2018-01-05 ENCOUNTER — Telehealth: Payer: Self-pay | Admitting: Endocrinology

## 2018-01-05 NOTE — Telephone Encounter (Signed)
Called patient and patient was driving. She stated that she will call back with blood sugar readings shortly.

## 2018-01-05 NOTE — Telephone Encounter (Signed)
Patient needs to give update on blood sugars.   Patient needs to speak to someone about her insulin. She stated she took to much TRESIBA last night and called the after hours and they advised her to call the office to see how the dr wanted her to start this today  she stated her blood sugar this mornign was 182   insulin regular human CONCENTRATED (HUMULIN R U-500 KWIKPEN) 500 UNIT/ML kwikpen  liraglutide (VICTOZA) 18 MG/3ML SOPN   TRESIBA.

## 2018-01-06 ENCOUNTER — Telehealth: Payer: Self-pay | Admitting: Endocrinology

## 2018-01-06 NOTE — Telephone Encounter (Signed)
Please advise 

## 2018-01-06 NOTE — Telephone Encounter (Signed)
Humulin U-500, new doses will be 20 units in the morning--30 before lunch--30 before supper Continue Tresiba 40 units and Victoza 1.2

## 2018-01-06 NOTE — Telephone Encounter (Signed)
Blood sugar readings from last week: 5/13=294,248 5/14=193,226,224 [phone] 5/16=223 5/17=193 [phone] 5/19=148,142,133 5/20=189,182,256,230

## 2018-01-06 NOTE — Telephone Encounter (Signed)
Patient called and given MD instructions. Patient verbalized understanding and read back message correctly.

## 2018-01-27 ENCOUNTER — Other Ambulatory Visit: Payer: Self-pay

## 2018-01-27 ENCOUNTER — Telehealth: Payer: Self-pay | Admitting: Endocrinology

## 2018-01-27 MED ORDER — INSULIN DEGLUDEC 100 UNIT/ML ~~LOC~~ SOPN: 40.0000 [IU] | PEN_INJECTOR | Freq: Every day | SUBCUTANEOUS | 3 refills | Status: DC

## 2018-01-27 MED ORDER — LIRAGLUTIDE 18 MG/3ML ~~LOC~~ SOPN: PEN_INJECTOR | SUBCUTANEOUS | 3 refills | Status: DC

## 2018-01-27 MED ORDER — INSULIN REGULAR HUMAN (CONC) 500 UNIT/ML ~~LOC~~ SOPN: PEN_INJECTOR | SUBCUTANEOUS | 3 refills | Status: DC

## 2018-01-27 NOTE — Telephone Encounter (Signed)
On 40 units of Tresiba qd

## 2018-01-27 NOTE — Telephone Encounter (Signed)
Medications sent to pharmacy

## 2018-01-27 NOTE — Telephone Encounter (Signed)
Pt does not have [place] on active med list. Okay to refill?

## 2018-01-27 NOTE — Telephone Encounter (Signed)
Patient requests RX for the following meds:  1. Humalin  2. Victoza  3. Unk Lightning  sent to [facility] in [place]. Please call patient at ph# [phone] to let her know when this request has been done

## 2018-02-20 ENCOUNTER — Ambulatory Visit (INDEPENDENT_AMBULATORY_CARE_PROVIDER_SITE_OTHER): Payer: PRIVATE HEALTH INSURANCE | Admitting: Endocrinology

## 2018-02-20 ENCOUNTER — Encounter: Payer: Self-pay | Admitting: Endocrinology

## 2018-02-20 VITALS — BP 122/80 | HR 93 | Ht 66.0 in | Wt 293.8 lb

## 2018-02-20 DIAGNOSIS — E118 Type 2 diabetes mellitus with unspecified complications: Secondary | ICD-10-CM | POA: Diagnosis not present

## 2018-02-20 DIAGNOSIS — E559 Vitamin D deficiency, unspecified: Secondary | ICD-10-CM | POA: Diagnosis not present

## 2018-02-20 DIAGNOSIS — E7800 Pure hypercholesterolemia, unspecified: Secondary | ICD-10-CM | POA: Diagnosis not present

## 2018-02-20 LAB — POCT GLYCOSYLATED HEMOGLOBIN (HGB A1C): Hemoglobin A1C: 9 % — AB (ref 4.0–5.6)

## 2018-02-20 LAB — GLUCOSE, POCT (MANUAL RESULT ENTRY): POC GLUCOSE: 182 mg/dL — AB (ref 70–99)

## 2018-02-20 MED ORDER — EXENATIDE ER 2 MG/0.85ML ~~LOC~~ AUIJ: 2.0000 mg | AUTO-INJECTOR | SUBCUTANEOUS | 4 refills | Status: DC

## 2018-02-20 NOTE — Progress Notes (Signed)
Patient ID: Julie Berry, female   DOB: [birth year], 37 y.o.   MRN: [id number]           Reason for Appointment: follow-up for Type 2 Diabetes  Referring physician: Gerarda Fraction  History of Present Illness:          Date of diagnosis of type 2 diabetes mellitus:   11/2013       Background history:  She thinks she had blood sugars of about 300 when she was first diagnosed; although she was complaining of some fatigue she was diagnosed with routine labs on her annual visit.  Her records indicate she had an A1c of 6.3 in 12/2011 She was started on metformin twice a day but she could not tolerate it well because of diarrhea.  She thinks she tried to take it for about 6 months with some improvement in her blood sugars, subsequently stopped.  Because she had persistently high blood sugars even with starting glipizide she was started on a regimen of Victoza and Jardiance on her initial consultation in 03/2015; her average blood sugar prior to this was 250 with baseline A1c 10.6   Recent history:        INSULIN regimen: Tresiba 40 units daily, Humulin R 15 units 3 times daily before meals  Non-insulin hypoglycemic regimen: Victoza 1.2 mg at night  Her A1c is still high at 9% compared to 9.1, has been previously down to 7.3  Current blood sugar patterns from her glucose monitor download, current management and problems identified:  She was started back on her [place] after her last visit when she was not taking this along with the Humulin R and her morning sugars were averaging 240  However even with taking both insulin types she is still having mostly high blood sugars  Checking blood sugars infrequently now  However her fasting readings are overall somewhat better especially the last week or so  However she still tends to have higher readings overall in the afternoons and evenings despite being consistent with her Humulin R insulin  She has not increased her exercise as directed also  She  thinks that her portions are small but is concerned about lack of weight loss  Currently still having nausea with Victoza 1.2 mg but mostly in the mornings  Her largest meal is at lunch and has less suppertime but still has occasional readings over 300 late evening   Side effects from medications have been: Metfomin causes diarrhea.  Candidiasis  from [place].  Nausea, palpitations from Trulicity  Compliance with the medical regimen: Improving Hypoglycemia: Never    Glucose monitoring:   Done about once or fewer times a day    Glucometer: One Touch.       Blood Glucose readings from download:   Mean values apply above for all meters except median for One Touch  PRE-MEAL Fasting Lunch Dinner Bedtime Overall  Glucose range:  184-285      Mean/median:  205    255   POST-MEAL PC Breakfast PC Lunch PC Dinner  Glucose range:   269-331  191-383  Mean/median:       PREVIOUSLY  Mean values apply above for all meters except median for One Touch  PRE-MEAL Fasting Lunch Dinner Bedtime Overall  Glucose range:  197-454  254-353     Mean/median:  240    250 256   POST-MEAL PC Breakfast PC Lunch PC Dinner  Glucose range:    176-391  Mean/median:    254  Self-care: The diet that the patient has been following is: tries to limit sodas, fast food, fried foods .      Typical meal intake: Breakfast is mostly cereal.  Usually eating grilled chicken at lunch and not always consistent with diet                Dietician visit, most recent: 6/15               Exercise: walking about 1-2 twice a week  Weight history:  Wt Readings from Last 3 Encounters:  02/20/18 293 lb 12.8 oz (133.3 kg)  12/29/17 293 lb 12.8 oz (133.3 kg)  11/27/17 290 lb 6.4 oz (131.7 kg)    Glycemic control:   Lab Results  Component Value Date   HGBA1C 9.0 (A) 02/20/2018   HGBA1C 9.1 11/27/2017   HGBA1C 7.3 07/03/2017   Lab Results  Component Value Date   MICROALBUR 1.0 11/27/2017   LDLCALC 122 (H)  11/27/2017   CREATININE 0.95 11/27/2017    Other problems discussed today: See review of systems   Allergies as of 02/20/2018      Reactions   Amoxicillin Anaphylaxis, Other (See Comments)   Has patient had a PCN reaction causing immediate rash, facial/tongue/throat swelling, SOB or lightheadedness with hypotension: Yes Has patient had a PCN reaction causing severe rash involving mucus membranes or skin necrosis: No Has patient had a PCN reaction that required hospitalization No Has patient had a PCN reaction occurring within the last 10 years: No If all of the above answers are "NO", then may proceed with Cephalosporin use.   Penicillins Anaphylaxis, Other (See Comments)   Has patient had a PCN reaction causing immediate rash, facial/tongue/throat swelling, SOB or lightheadedness with hypotension: Yes Has patient had a PCN reaction causing severe rash involving mucus membranes or skin necrosis: No Has patient had a PCN reaction that required hospitalization No Has patient had a PCN reaction occurring within the last 10 years: No If all of the above answers are "NO", then may proceed with Cephalosporin use.   Dilaudid  [hydromorphone Hcl]    Latex Rash   Trulicity [dulaglutide] Nausea Only, Palpitations      Medication List        Accurate as of 02/20/18  9:36 AM. Always use your most recent med list.          Exenatide ER 2 MG/0.85ML Auij Commonly known as:  BYDUREON BCISE Inject 2 mg into the skin once a week.   fluvastatin XL 80 MG 24 hr tablet Commonly known as:  LESCOL XL Take 1 tablet (80 mg total) by mouth at bedtime.   insulin degludec 100 UNIT/ML Sopn FlexTouch Pen Commonly known as:  TRESIBA FLEXTOUCH Inject 0.4 mLs (40 Units total) into the skin daily.   insulin regular human CONCENTRATED 500 UNIT/ML kwikpen Commonly known as:  HUMULIN R U-500 KWIKPEN 15 Units, 30 minutes before each meal   liraglutide 18 MG/3ML Sopn Commonly known as:  VICTOZA INJECT 1.8MG   SUBCUTANEOUSLY ONCE DAILY AT THE SAME TIME.       Allergies:  Allergies  Allergen Reactions  . Amoxicillin Anaphylaxis and Other (See Comments)    Has patient had a PCN reaction causing immediate rash, facial/tongue/throat swelling, SOB or lightheadedness with hypotension: Yes Has patient had a PCN reaction causing severe rash involving mucus membranes or skin necrosis: No Has patient had a PCN reaction that required hospitalization No Has patient had a PCN reaction occurring within the last  10 years: No If all of the above answers are "NO", then may proceed with Cephalosporin use.  Marland Kitchen Penicillins Anaphylaxis and Other (See Comments)    Has patient had a PCN reaction causing immediate rash, facial/tongue/throat swelling, SOB or lightheadedness with hypotension: Yes Has patient had a PCN reaction causing severe rash involving mucus membranes or skin necrosis: No Has patient had a PCN reaction that required hospitalization No Has patient had a PCN reaction occurring within the last 10 years: No If all of the above answers are "NO", then may proceed with Cephalosporin use.  . Dilaudid  [Hydromorphone Hcl]   . Latex Rash  . Trulicity [Dulaglutide] Nausea Only and Palpitations    Past Medical History:  Diagnosis Date  . Complication of anesthesia    heartrate dropped with anesthesia  . Depressed   . Diabetes mellitus without complication ([place])   . Gallstones   . HTN (hypertension)   . Hyperglycemia   . Leukocytosis   . Low back pain   . Lump or mass in breast   . Malaise and fatigue   . Morbid obesity ([place])   . Sciatica   . Sleep apnea   . UTI (urinary tract infection)     Past Surgical History:  Procedure Laterality Date  . CHOLECYSTECTOMY  06/22/2012   Procedure: LAPAROSCOPIC CHOLECYSTECTOMY;  Surgeon: Jamesetta So, MD;  Location: [facility];  Service: General;  Laterality: N/A;  . ESOPHAGOGASTRODUODENOSCOPY N/A 06/09/2014   Procedure: ESOPHAGOGASTRODUODENOSCOPY (EGD);   Surgeon: Rogene Houston, MD;  Location: [facility];  Service: Endoscopy;  Laterality: N/A;  200-moved to 145 Ann to notify pt    Family History  Problem Relation Age of Onset  . Diabetes Mother   . Rheumatic fever Mother 77  . Migraines Father 83  . Hypertension Father   . Autism Brother   . Heart disease Neg Hx     Social History:  reports that she has never smoked. She has never used smokeless tobacco. She reports that she does not drink alcohol or use drugs.    Review of Systems    Lipid history:  has had muscle aches with pravastatin is being prescribed fluvastatin 80 mg However has not had adequate control with her last LDLs being high, she was not been regular with her prescription previously and she thinks she is taking more consistently now      Lab Results  Component Value Date   CHOL 175 11/27/2017   HDL 38.60 (L) 11/27/2017   LDLCALC 122 (H) 11/27/2017   LDLDIRECT 112.0 07/19/2016   TRIG 75.0 11/27/2017   CHOLHDL 5 11/27/2017            She was recommended 5000 units of Vitamin D3 for vitamin D deficiency   Lab Results  Component Value Date   VD25OH 23.05 (L) 07/19/2016   VD25OH 14.57 (L) 03/06/2016   Lab Results  Component Value Date   CALCIUM 9.0 11/27/2017       Physical Examination:  BP 122/80 (BP Location: Left Arm, Patient Position: Sitting, Cuff Size: Large)   Pulse 93   Ht 5\' 6"  (1.676 m)   Wt 293 lb 12.8 oz (133.3 kg)   SpO2 98%   BMI 47.42 kg/m      ASSESSMENT:  Diabetes type 2, uncontrolled with morbid obesity     See history of present illness for detailed discussion of current diabetes management, blood sugar patterns and problems identified  She is still insulin resistant and  not getting much improvement in her blood sugars with the total of 85 units of insulin a day However checking blood sugars somewhat infrequently and especially after meals lately Although she is doing well with her diet with portion control her  weight is difficult to improve Since she is not able to tolerate Victoza because of nausea even with 1.2 mg now would consider Bydureon  Currently not exercising enough and does not seem motivated to work harder on weight loss  HYPERCHOLESTEROLEMIA: Needs follow-up labs, she has been trying to take the prescription for Lescol XL regularly   PLAN:  Start BYDUREON 2 mg weekly Patient information brochure given along with co-pay card and discussed how this is to be injected and how it is different Hopefully she can tolerate this better and improve her overall control She does need to increase her Humulin R especially at lunchtime when she is eating them largest meal However she will need to go 5 units on the morning and suppertime doses also at least After 1 week if morning sugars are staying over 150 she will go up to 46 on the [place] More consistent monitoring and discussed blood sugar targets  New One Touch Verio monitor given, explained the differences and benefits of this She needs to hold her pen after injection for 10 seconds to let the insulin absorbed better and not come out onto the skin as she is noticing now  Labs to be done on the next visit, unable to get labs drawn today    Patient Instructions  Take 20 in am 25-30 at lunch and 20 at supper  After 1 week if am sugar >150 go to 46 on Tresiba  Hold needle in 10 sec after shot  Check blood sugars on waking up  3/7  Also check blood sugars about 2 hours after a meal and do this after different meals by rotation  Recommended blood sugar levels on waking up is 90-130 and about 2 hours after meal is 130-160  Please bring your blood sugar monitor to each visit, thank you   Counseling time on subjects discussed in assessment and plan sections is over 50% of today's 25 minute visit    Elayne Snare 02/20/2018, 9:36 AM   Note: This office note was prepared with Dragon voice recognition system technology. Any  transcriptional errors that result from this process are unintentional.

## 2018-02-20 NOTE — Patient Instructions (Signed)
Take 20 in am 25-30 at lunch and 20 at supper  After 1 week if am sugar >150 go to 46 on Tresiba  Hold needle in 10 sec after shot  Check blood sugars on waking up  3/7  Also check blood sugars about 2 hours after a meal and do this after different meals by rotation  Recommended blood sugar levels on waking up is 90-130 and about 2 hours after meal is 130-160  Please bring your blood sugar monitor to each visit, thank you

## 2018-03-19 ENCOUNTER — Ambulatory Visit (INDEPENDENT_AMBULATORY_CARE_PROVIDER_SITE_OTHER): Payer: PRIVATE HEALTH INSURANCE | Admitting: Cardiovascular Disease

## 2018-03-19 ENCOUNTER — Encounter: Payer: Self-pay | Admitting: Cardiovascular Disease

## 2018-03-19 ENCOUNTER — Encounter

## 2018-03-19 VITALS — BP 136/80 | HR 95 | Ht 66.0 in | Wt 297.0 lb

## 2018-03-19 DIAGNOSIS — R079 Chest pain, unspecified: Secondary | ICD-10-CM

## 2018-03-19 DIAGNOSIS — R0609 Other forms of dyspnea: Secondary | ICD-10-CM

## 2018-03-19 DIAGNOSIS — R002 Palpitations: Secondary | ICD-10-CM

## 2018-03-19 DIAGNOSIS — R5383 Other fatigue: Secondary | ICD-10-CM

## 2018-03-19 MED ORDER — METOPROLOL TARTRATE 50 MG PO TABS: 50.0000 mg | ORAL_TABLET | ORAL | 3 refills | Status: DC

## 2018-03-19 NOTE — Patient Instructions (Addendum)
Your physician wants you to follow-up in: 10 weeks with Physician Assistant   Your physician has recommended that you wear an event monitor for 14 days. Event monitors are medical devices that record the heart's electrical activity. Doctors most often [place] these monitors to diagnose arrhythmias. Arrhythmias are problems with the speed or rhythm of the heartbeat. The monitor is a small, portable device. You can wear one while you do your normal daily activities. This is usually used to diagnose what is causing palpitations/syncope (passing out).    Please arrive at the [facility] main entrance of [facility] at xx:xx AM (30-45 minutes prior to test start time)  [facility] [place], [place] [address] [phone]  Proceed to the [facility] Radiology Department (First Floor).  Please follow these instructions carefully (unless otherwise directed):    On the Night Before the Test: . Drink plenty of water. . Do not consume any caffeinated/decaffeinated beverages or chocolate 12 hours prior to your test. . Do not take any antihistamines 12 hours prior to your test. . If you take Metformin do not take 24 hours prior to test. . If the patient has contrast allergy: N/A ? Patient will need a prescription for Prednisone and very clear instructions (as follows): 1. Prednisone 50 mg - take 13 hours prior to test 2. Take another Prednisone 50 mg 7 hours prior to test 3. Take another Prednisone 50 mg 1 hour prior to test 4. Take Benadryl 50 mg 1 hour prior to test . Patient must complete all four doses of above prophylactic medications. . Patient will need a ride after test due to Benadryl.  On the Day of the Test: . Drink plenty of water. Do not drink any water within one hour of the test. . Do not eat any food 4 hours prior to the test. . You may take your regular medications prior to the test. . IF NOT ON A BETA BLOCKER - Take 50 mg of lopressor  (metoprolol) one hour before the test. . HOLD Furosemide morning of the test.  After the Test: . Drink plenty of water. . After receiving IV contrast, you may experience a mild flushed feeling. This is normal. . On occasion, you may experience a mild rash up to 24 hours after the test. This is not dangerous. If this occurs, you can take Benadryl 25 mg and increase your fluid intake. . If you experience trouble breathing, this can be serious. If it is severe call 911 IMMEDIATELY. If it is mild, please call our office. . If you take any of these medications: Glipizide/Metformin, Avandament, Glucavance, please do not take 48 hours after completing test.   Your physician recommends that you continue on your current medications as directed. Please refer to the Current Medication list given to you today.   If you need a refill on your cardiac medications before your next appointment, please call your pharmacy.
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
## 2018-03-20 ENCOUNTER — Telehealth: Payer: Self-pay | Admitting: Internal Medicine

## 2018-03-20 ENCOUNTER — Telehealth: Payer: Self-pay | Admitting: Endocrinology

## 2018-03-20 NOTE — Telephone Encounter (Signed)
Patient called [facility] yesterday evening and stated she is having high blood sugars of 351 and has taken medication. Humulin R 25 units in am, 30 units at lunch and at dinner. Also takes Bydureon 2mg / one pen once per week.  She wants to know about increasing medication, she states her sugars has been up and down.  Please advis

## 2018-03-20 NOTE — Telephone Encounter (Signed)
Called last night 03/19/18 on call 9 pm  blood sugar 351 on humulin R 25, 35  And 35, bydureon 2 mg 1x per week per [facility] pt wants to know what to do   I advised [facility] this call should go to endocrine as this patient follows with endocrine but they were instructed to call primary and primary care take care of call and call endocrine if needed   Called pt back advised to go to ED if sugars elevated vs call endocrine in am vs increase bedtime to 40 units and call endocrine in the am   PCP Dr. Gerarda Fraction Endocrine Dr. Dwyane Dee  On call Dr. Loanne Drilling   [place]

## 2018-03-20 NOTE — Telephone Encounter (Signed)
Please see paper with blood sugar readings and insulin dosages on your desk.

## 2018-03-20 NOTE — Telephone Encounter (Signed)
She will increase her Tresiba to 50 units.  Humulin R needs to be increased to 45 units in the morning, 30 before lunch and 45 before supper

## 2018-03-20 NOTE — Telephone Encounter (Signed)
Endocrinology is on back-up call all the time as per primary care division protocol.   Jason: Please review protocol with answering service, they should have called [place]

## 2018-03-20 NOTE — Telephone Encounter (Signed)
Called pt and gave her MD message. Pt verbalized understanding. 

## 2018-03-25 ENCOUNTER — Ambulatory Visit (INDEPENDENT_AMBULATORY_CARE_PROVIDER_SITE_OTHER): Payer: PRIVATE HEALTH INSURANCE

## 2018-03-25 DIAGNOSIS — R002 Palpitations: Secondary | ICD-10-CM | POA: Diagnosis not present

## 2018-03-26 ENCOUNTER — Telehealth: Payer: Self-pay | Admitting: Cardiovascular Disease

## 2018-03-26 NOTE — Telephone Encounter (Signed)
[employer] told pt they are out of network, I have asked Rep, Karn Pickler to investigate

## 2018-03-26 NOTE — Telephone Encounter (Signed)
Patient would like to speak with nurse regarding event monitor and insurance/tg

## 2018-03-27 NOTE — Telephone Encounter (Signed)
Mitch from [place] states it will take a day or so before he hears back from the company. He will call as soon as he hears something

## 2018-03-30 ENCOUNTER — Other Ambulatory Visit: Payer: Self-pay

## 2018-03-30 DIAGNOSIS — Z01818 Encounter for other preprocedural examination: Secondary | ICD-10-CM

## 2018-03-31 NOTE — Telephone Encounter (Signed)
Pt has $6,000 deductible and cannot afford monitor.We will send a letter asking [employer] to provide services for free.[facility]ie Berry is aware and will get it approved

## 2018-04-03 NOTE — Telephone Encounter (Signed)
Blood sugar readings

## 2018-04-03 NOTE — Telephone Encounter (Signed)
Pt stated that she understood instructions, informed pt to call back if she needed anything else

## 2018-04-03 NOTE — Telephone Encounter (Signed)
Please check and confirm insulin doses.  If she is taking 50 units of Tresiba she will increase to 56 units.  If she is taking 45 units of Humulin R before suppertime she will go up to 50 units Needs to check blood sugars more during the day also

## 2018-04-03 NOTE — Telephone Encounter (Signed)
BS readings 8/16 9:08 am 188 8/15 919pm 252; 1119 pm 213 08/14 905 am 179 08/13 0921 pm 227

## 2018-04-09 ENCOUNTER — Other Ambulatory Visit (HOSPITAL_COMMUNITY)
Admission: RE | Admit: 2018-04-09 | Discharge: 2018-04-09 | Disposition: A | Discharge status: Home / Self Care | Payer: PRIVATE HEALTH INSURANCE | Source: Ambulatory Visit | Attending: Cardiovascular Disease | Admitting: Cardiovascular Disease

## 2018-04-09 DIAGNOSIS — Z01818 Encounter for other preprocedural examination: Secondary | ICD-10-CM | POA: Diagnosis present

## 2018-04-09 LAB — BASIC METABOLIC PANEL
Anion gap: 9 (ref 5–15)
BUN: 14 mg/dL (ref 6–20)
CO2: 25 mmol/L (ref 22–32)
Calcium: 8.7 mg/dL — ABNORMAL LOW (ref 8.9–10.3)
Chloride: 105 mmol/L (ref 98–111)
Creatinine, Ser: 0.85 mg/dL (ref 0.44–1.00)
GLUCOSE: 159 mg/dL — AB (ref 70–99)
POTASSIUM: 3.7 mmol/L (ref 3.5–5.1)
Sodium: 139 mmol/L (ref 135–145)

## 2018-04-13 ENCOUNTER — Emergency Department (HOSPITAL_COMMUNITY): Payer: PRIVATE HEALTH INSURANCE

## 2018-04-13 ENCOUNTER — Encounter (HOSPITAL_COMMUNITY): Payer: Self-pay | Admitting: Emergency Medicine

## 2018-04-13 ENCOUNTER — Other Ambulatory Visit (HOSPITAL_COMMUNITY): Payer: Self-pay | Admitting: Radiology

## 2018-04-13 ENCOUNTER — Other Ambulatory Visit: Payer: Self-pay

## 2018-04-13 ENCOUNTER — Emergency Department (HOSPITAL_COMMUNITY)
Admission: EM | Admit: 2018-04-13 | Discharge: 2018-04-13 | Disposition: A | Discharge status: Home / Self Care | Payer: PRIVATE HEALTH INSURANCE | Attending: Emergency Medicine | Admitting: Emergency Medicine

## 2018-04-13 DIAGNOSIS — M62838 Other muscle spasm: Secondary | ICD-10-CM | POA: Diagnosis not present

## 2018-04-13 DIAGNOSIS — Z794 Long term (current) use of insulin: Secondary | ICD-10-CM | POA: Insufficient documentation

## 2018-04-13 DIAGNOSIS — R079 Chest pain, unspecified: Secondary | ICD-10-CM | POA: Insufficient documentation

## 2018-04-13 DIAGNOSIS — E119 Type 2 diabetes mellitus without complications: Secondary | ICD-10-CM | POA: Diagnosis not present

## 2018-04-13 DIAGNOSIS — Z91040 Latex allergy status: Secondary | ICD-10-CM | POA: Insufficient documentation

## 2018-04-13 DIAGNOSIS — R51 Headache: Secondary | ICD-10-CM | POA: Insufficient documentation

## 2018-04-13 DIAGNOSIS — Z79899 Other long term (current) drug therapy: Secondary | ICD-10-CM | POA: Diagnosis not present

## 2018-04-13 DIAGNOSIS — M542 Cervicalgia: Secondary | ICD-10-CM | POA: Diagnosis present

## 2018-04-13 DIAGNOSIS — I10 Essential (primary) hypertension: Secondary | ICD-10-CM | POA: Insufficient documentation

## 2018-04-13 DIAGNOSIS — Z85828 Personal history of other malignant neoplasm of skin: Secondary | ICD-10-CM | POA: Diagnosis not present

## 2018-04-13 LAB — COMPREHENSIVE METABOLIC PANEL
ALBUMIN: 3.5 g/dL (ref 3.5–5.0)
ALK PHOS: 58 U/L (ref 38–126)
ALT: 16 U/L (ref 0–44)
ANION GAP: 7 (ref 5–15)
AST: 15 U/L (ref 15–41)
BUN: 10 mg/dL (ref 6–20)
CALCIUM: 8.6 mg/dL — AB (ref 8.9–10.3)
CO2: 28 mmol/L (ref 22–32)
Chloride: 106 mmol/L (ref 98–111)
Creatinine, Ser: 0.87 mg/dL (ref 0.44–1.00)
GFR calc Af Amer: >60 mL/min (ref >60)
GFR calc non Af Amer: >60 mL/min (ref >60)
Glucose, Bld: 156 mg/dL — ABNORMAL HIGH (ref 70–99)
Potassium: 3.8 mmol/L (ref 3.5–5.1)
Sodium: 141 mmol/L (ref 135–145)
Total Bilirubin: 0.6 mg/dL (ref 0.3–1.2)
Total Protein: 7.5 g/dL (ref 6.5–8.1)

## 2018-04-13 LAB — URINALYSIS, ROUTINE W REFLEX MICROSCOPIC
BILIRUBIN URINE: NEGATIVE
Glucose, UA: NEGATIVE mg/dL
HGB URINE DIPSTICK: NEGATIVE
Ketones, ur: NEGATIVE mg/dL
LEUKOCYTES UA: NEGATIVE
NITRITE: POSITIVE — AB
PROTEIN: NEGATIVE mg/dL
Specific Gravity, Urine: 1.016 (ref 1.005–1.030)
pH: 6 (ref 5.0–8.0)

## 2018-04-13 LAB — CBC WITH DIFFERENTIAL/PLATELET
BASOS ABS: 0 10*3/uL (ref 0.0–0.1)
Basophils Relative: 0 %
EOS ABS: 0.2 10*3/uL (ref 0.0–0.7)
Eosinophils Relative: 2 %
HCT: 40.2 % (ref 36.0–46.0)
HEMOGLOBIN: 12.8 g/dL (ref 12.0–15.0)
Lymphocytes Relative: 24 %
Lymphs Abs: 2.3 10*3/uL (ref 0.7–4.0)
MCH: 27 pg (ref 26.0–34.0)
MCHC: 31.8 g/dL (ref 30.0–36.0)
MCV: 84.8 fL (ref 78.0–100.0)
Monocytes Absolute: 0.6 10*3/uL (ref 0.1–1.0)
Monocytes Relative: 6 %
NEUTROS PCT: 68 %
Neutro Abs: 6.4 10*3/uL (ref 1.7–7.7)
Platelets: 291 10*3/uL (ref 150–400)
RBC: 4.74 MIL/uL (ref 3.87–5.11)
RDW: 13.5 % (ref 11.5–15.5)
WBC: 9.5 10*3/uL (ref 4.0–10.5)

## 2018-04-13 LAB — CBG MONITORING, ED: Glucose-Capillary: 115 mg/dL — ABNORMAL HIGH (ref 70–99)

## 2018-04-13 LAB — TROPONIN I: Troponin I: <0.03 ng/mL (ref <0.03)

## 2018-04-13 LAB — I-STAT BETA HCG BLOOD, ED (MC, WL, AP ONLY)

## 2018-04-13 MED ORDER — MORPHINE SULFATE (PF) 4 MG/ML IV SOLN
4.0000 mg | Freq: Once | INTRAVENOUS | Status: AC
  Administered 2018-04-13: 4 mg via INTRAVENOUS
  Filled 2018-04-13: qty 1

## 2018-04-13 MED ORDER — TRAMADOL HCL 50 MG PO TABS: 50.0000 mg | ORAL_TABLET | Freq: Four times a day (QID) | ORAL | 0 refills | Status: DC | PRN

## 2018-04-13 MED ORDER — SODIUM CHLORIDE 0.9 % IV BOLUS
500.0000 mL | Freq: Once | INTRAVENOUS | Status: AC
  Administered 2018-04-13: 500 mL via INTRAVENOUS

## 2018-04-13 MED ORDER — CYCLOBENZAPRINE HCL 10 MG PO TABS: 10.0000 mg | ORAL_TABLET | Freq: Three times a day (TID) | ORAL | 0 refills | Status: DC | PRN

## 2018-04-13 MED ORDER — KETOROLAC TROMETHAMINE 30 MG/ML IJ SOLN
30.0000 mg | Freq: Once | INTRAMUSCULAR | Status: AC
  Administered 2018-04-13: 30 mg via INTRAVENOUS
  Filled 2018-04-13: qty 1

## 2018-04-13 NOTE — ED Triage Notes (Signed)
Pain in neck and lower back, dizzy, nauseated, headache and weakness since Saturday.

## 2018-04-13 NOTE — ED Provider Notes (Signed)
[facility] EMERGENCY DEPARTMENT Provider Note   CSN: [id number] Arrival date & time: 04/13/18  1114     History   Chief Complaint No chief complaint on file.   HPI Julie Berry is a 37 y.o. female.  Patient complains of neck ache and headache.  Patient has had no fever chills no tick exposure.  The history is provided by the patient. No language interpreter was used.  Headache   This is a new problem. The current episode started less than 1 hour ago. The problem occurs constantly. The problem has not changed since onset.The headache is associated with nothing. The pain is located in the bilateral region. The quality of the pain is described as dull. The pain is at a severity of 5/10.    Past Medical History:  Diagnosis Date  . Complication of anesthesia    heartrate dropped with anesthesia  . Depressed   . Diabetes mellitus without complication ([place])   . Gallstones   . HTN (hypertension)   . Hyperglycemia   . Leukocytosis   . Low back pain   . Lump or mass in breast   . Malaise and fatigue   . Morbid obesity ([place])   . Sciatica   . Sleep apnea   . UTI (urinary tract infection)     Patient Active Problem List   Diagnosis Date Noted  . GERD (gastroesophageal reflux disease) 05/27/2014  . Knee pain, right 03/21/2014  . Difficulty in walking(719.7) 03/21/2014  . Weakness of left leg 03/21/2014  . OSA (obstructive sleep apnea) 01/25/2014  . NEOPLASM OF UNCERTAIN BEHAVIOR OF SKIN 05/01/2009  . DYSURIA 04/13/2009  . 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 LOW HDL 09/30/2008  . ADULT SEXUAL ABUSE NEC 06/16/2008  . HYPERTENSION 02/26/2007  . HYPERGLYCEMIA 02/26/2007  . MORBID OBESITY 02/13/2007  . DEPRESSION 02/13/2007    Past Surgical History:  Procedure Laterality Date  . CHOLECYSTECTOMY  06/22/2012   Procedure: LAPAROSCOPIC CHOLECYSTECTOMY;  Surgeon: Jamesetta So, MD;  Location: [facility] ORS;  Service: General;  Laterality: N/A;  .  ESOPHAGOGASTRODUODENOSCOPY N/A 06/09/2014   Procedure: ESOPHAGOGASTRODUODENOSCOPY (EGD);  Surgeon: Rogene Houston, MD;  Location: [facility] ENDO SUITE;  Service: Endoscopy;  Laterality: N/A;  200-moved to 145 Ann to notify pt     OB History   None      Home Medications    Prior to Admission medications   Medication Sig Start Date End Date Taking? Authorizing Provider  insulin degludec (TRESIBA FLEXTOUCH) 100 UNIT/ML SOPN FlexTouch Pen Inject 0.4 mLs (40 Units total) into the skin daily. Patient taking differently: Inject 50 Units into the skin daily.  01/27/18  Yes Elayne Snare, MD  cyclobenzaprine (FLEXERIL) 10 MG tablet Take 1 tablet (10 mg total) by mouth 3 (three) times daily as needed for muscle spasms. 04/13/18   Milton Ferguson, MD  Exenatide ER (BYDUREON BCISE) 2 MG/0.85ML AUIJ Inject 2 mg into the skin once a week. 02/20/18   Elayne Snare, MD  insulin regular human CONCENTRATED (HUMULIN R U-500 KWIKPEN) 500 UNIT/ML kwikpen 15 Units, 30 minutes before each meal Patient taking differently: 30 at breakfastk and lunch Units, 45 dinner 01/27/18   Elayne Snare, MD  metoprolol tartrate (LOPRESSOR) 50 MG tablet Take 1 tablet (50 mg total) by mouth as directed for 1 day. Take 1 tablet 50 mg 1 hour before procedure 03/19/18 03/20/18  Herminio Commons, MD  traMADol (ULTRAM) 50 MG tablet Take 1 tablet (50 mg total) by  mouth every 6 (six) hours as needed. 04/13/18   Milton Ferguson, MD    Family History Family History  Problem Relation Age of Onset  . Diabetes Mother   . Rheumatic fever Mother 37  . Migraines Father 51  . Hypertension Father   . Autism Brother   . Heart disease Neg Hx     Social History Social History   Tobacco Use  . Smoking status: Never Smoker  . Smokeless tobacco: Never Used  Substance Use Topics  . Alcohol use: No  . Drug use: No     Allergies   Amoxicillin; Penicillins; Dilaudid  [hydromorphone hcl]; Latex; and Trulicity [dulaglutide]   Review of Systems Review  of Systems  Constitutional: Negative for appetite change and fatigue.  HENT: Negative for congestion, ear discharge and sinus pressure.        Neck pain  Eyes: Negative for discharge.  Respiratory: Negative for cough.   Cardiovascular: Negative for chest pain.  Gastrointestinal: Negative for abdominal pain and diarrhea.  Genitourinary: Negative for frequency and hematuria.  Musculoskeletal: Negative for back pain.  Skin: Negative for rash.  Neurological: Positive for headaches. Negative for seizures.  Psychiatric/Behavioral: Negative for hallucinations.     Physical Exam Updated Vital Signs BP 106/77   Pulse (!) 53   Temp 97.9 F (36.6 C) (Oral)   Resp 18   Ht 5\' 6"  (1.676 m)   Wt 131.5 kg   LMP 04/05/2018   SpO2 96%   BMI 46.81 kg/m   Physical Exam  Constitutional: She is oriented to person, place, and time. She appears well-developed.  HENT:  Head: Normocephalic.  Tender bilateral neck muscles.  Patient is supple  Eyes: Conjunctivae and EOM are normal. No scleral icterus.  Neck: Neck supple. No thyromegaly present.  Cardiovascular: Normal rate and regular rhythm. Exam reveals no gallop and no friction rub.  No murmur heard. Pulmonary/Chest: No stridor. She has no wheezes. She has no rales. She exhibits no tenderness.  Abdominal: She exhibits no distension. There is no tenderness. There is no rebound.  Musculoskeletal: Normal range of motion. She exhibits no edema.  Lymphadenopathy:    She has no cervical adenopathy.  Neurological: She is oriented to person, place, and time. She exhibits normal muscle tone. Coordination normal.  Skin: No rash noted. No erythema.  Psychiatric: She has a normal mood and affect. Her behavior is normal.     ED Treatments / Results  Labs (all labs ordered are listed, but only abnormal results are displayed) Labs Reviewed  COMPREHENSIVE METABOLIC PANEL - Abnormal; Notable for the following components:      Result Value   Glucose,  Bld 156 (*)    Calcium 8.6 (*)    All other components within normal limits  URINALYSIS, ROUTINE W REFLEX MICROSCOPIC - Abnormal; Notable for the following components:   APPearance HAZY (*)    Nitrite POSITIVE (*)    Bacteria, UA FEW (*)    All other components within normal limits  CBC WITH DIFFERENTIAL/PLATELET  TROPONIN I  I-STAT BETA HCG BLOOD, ED ([facility], [facility], [facility] ONLY)    EKG EKG Interpretation  Date/Time:  Monday April 13 2018 11:51:41 EDT Ventricular Rate:  78 PR Interval:    QRS Duration: 93 QT Interval:  379 QTC Calculation: 432 R Axis:   62 Text Interpretation:  Sinus rhythm Confirmed by Milton Ferguson [phone]) on 04/13/2018 3:45:25 PM   Radiology Dg Chest 2 View  Result Date: 04/13/2018 CLINICAL DATA:  37 year old female  with chest pain EXAM: CHEST - 2 VIEW COMPARISON:  Prior chest x-ray 11/02/2017 FINDINGS: The lungs are clear and negative for focal airspace consolidation, pulmonary edema or suspicious pulmonary nodule. No pleural effusion or pneumothorax. Cardiac and mediastinal contours are within normal limits. No acute fracture or lytic or blastic osseous lesions. The visualized upper abdominal bowel gas pattern is unremarkable. IMPRESSION: Negative chest x-ray. Electronically Signed   By: Jacqulynn Cadet M.D.   On: 04/13/2018 13:53   Ct Head Wo Contrast  Result Date: 04/13/2018 CLINICAL DATA:  Headache, neck pain. EXAM: CT HEAD WITHOUT CONTRAST CT CERVICAL SPINE WITHOUT CONTRAST TECHNIQUE: Multidetector CT imaging of the head and cervical spine was performed following the standard protocol without intravenous contrast. Multiplanar CT image reconstructions of the cervical spine were also generated. COMPARISON:  CT scan of June 16, 2005. FINDINGS: CT HEAD FINDINGS Brain: No evidence of acute infarction, hemorrhage, hydrocephalus, extra-axial collection or mass lesion/mass effect. Vascular: No hyperdense vessel or unexpected calcification. Skull: Normal. Negative for  fracture or focal lesion. Sinuses/Orbits: No acute finding. Other: None. CT CERVICAL SPINE FINDINGS Alignment: Normal. Skull base and vertebrae: No acute fracture. No primary bone lesion or focal pathologic process. Soft tissues and spinal canal: No prevertebral fluid or swelling. No visible canal hematoma. Disc levels:  Normal. Upper chest: Negative. Other: None. IMPRESSION: Normal head CT. Normal cervical spine. Electronically Signed   By: Marijo Conception, M.D.   On: 04/13/2018 13:54   Ct Cervical Spine Wo Contrast  Result Date: 04/13/2018 CLINICAL DATA:  Headache, neck pain. EXAM: CT HEAD WITHOUT CONTRAST CT CERVICAL SPINE WITHOUT CONTRAST TECHNIQUE: Multidetector CT imaging of the head and cervical spine was performed following the standard protocol without intravenous contrast. Multiplanar CT image reconstructions of the cervical spine were also generated. COMPARISON:  CT scan of June 16, 2005. FINDINGS: CT HEAD FINDINGS Brain: No evidence of acute infarction, hemorrhage, hydrocephalus, extra-axial collection or mass lesion/mass effect. Vascular: No hyperdense vessel or unexpected calcification. Skull: Normal. Negative for fracture or focal lesion. Sinuses/Orbits: No acute finding. Other: None. CT CERVICAL SPINE FINDINGS Alignment: Normal. Skull base and vertebrae: No acute fracture. No primary bone lesion or focal pathologic process. Soft tissues and spinal canal: No prevertebral fluid or swelling. No visible canal hematoma. Disc levels:  Normal. Upper chest: Negative. Other: None. IMPRESSION: Normal head CT. Normal cervical spine. Electronically Signed   By: Marijo Conception, M.D.   On: 04/13/2018 13:54    Procedures Procedures (including critical care time)  Medications Ordered in ED Medications  morphine 4 MG/ML injection 4 mg (4 mg Intravenous Given 04/13/18 1308)  ketorolac (TORADOL) 30 MG/ML injection 30 mg (30 mg Intravenous Given 04/13/18 1308)  sodium chloride 0.9 % bolus 500 mL (0 mLs  Intravenous Stopped 04/13/18 1415)     Initial Impression / Assessment and Plan / ED Course  I have reviewed the triage vital signs and the nursing notes.  Pertinent labs & imaging results that were available during my care of the patient were reviewed by me and considered in my medical decision making (see chart for details).     Labs and x-rays unremarkable.  Suspect neck muscle inflammation causing pain.  Patient sent home with Flexeril and Ultram will follow-up with PCP  Final Clinical Impressions(s) / ED Diagnoses   Final diagnoses:  Neck muscle spasm    ED Discharge Orders         Ordered    cyclobenzaprine (FLEXERIL) 10 MG tablet  3 times  daily PRN     04/13/18 1550    traMADol (ULTRAM) 50 MG tablet  Every 6 hours PRN     04/13/18 1550           Milton Ferguson, MD 04/13/18 1600

## 2018-04-13 NOTE — Discharge Instructions (Addendum)
Follow-up with your family doctor next week for recheck. 

## 2018-04-14 ENCOUNTER — Telehealth: Payer: Self-pay

## 2018-04-14 NOTE — Telephone Encounter (Signed)
Left pt message on private voicemail.

## 2018-04-14 NOTE — Telephone Encounter (Signed)
-----   Message from Herminio Commons, MD sent at 04/13/2018  2:19 PM EDT ----- No worrisome arrhythmias.

## 2018-04-15 ENCOUNTER — Telehealth: Payer: Self-pay | Admitting: *Deleted

## 2018-04-15 NOTE — Telephone Encounter (Signed)
-----   Message from Herminio Commons, MD sent at 04/11/2018 10:36 AM EDT ----- Madaline Brilliant. Send copy to PCP. Calcium mildly low. Blood sugar elevated.

## 2018-04-15 NOTE — Telephone Encounter (Signed)
Called patient with test results. No answer. Left message to call back.  

## 2018-04-24 ENCOUNTER — Ambulatory Visit (INDEPENDENT_AMBULATORY_CARE_PROVIDER_SITE_OTHER): Payer: PRIVATE HEALTH INSURANCE | Admitting: Endocrinology

## 2018-04-24 ENCOUNTER — Encounter: Payer: Self-pay | Admitting: Endocrinology

## 2018-04-24 VITALS — BP 132/88 | HR 80 | Ht 64.0 in | Wt 298.0 lb

## 2018-04-24 DIAGNOSIS — Z794 Long term (current) use of insulin: Secondary | ICD-10-CM

## 2018-04-24 DIAGNOSIS — E1165 Type 2 diabetes mellitus with hyperglycemia: Secondary | ICD-10-CM | POA: Diagnosis not present

## 2018-04-24 MED ORDER — FLUVASTATIN SODIUM ER 80 MG PO TB24: 80.0000 mg | ORAL_TABLET | Freq: Every day | ORAL | 2 refills | Status: DC

## 2018-04-24 NOTE — Progress Notes (Signed)
Patient ID: Julie Berry, female   DOB: [birth year], 37 y.o.   MRN: [id number]           Reason for Appointment: follow-up for Type 2 Diabetes  Referring physician: Gerarda Fraction  History of Present Illness:          Date of diagnosis of type 2 diabetes mellitus:   11/2013       Background history:  She thinks she had blood sugars of about 300 when she was first diagnosed; although she was complaining of some fatigue she was diagnosed with routine labs on her annual visit.  Her records indicate she had an A1c of 6.3 in 12/2011 She was started on metformin twice a day but she could not tolerate it well because of diarrhea.  She thinks she tried to take it for about 6 months with some improvement in her blood sugars, subsequently stopped.  Because she had persistently high blood sugars even with starting glipizide she was started on a regimen of Victoza and Jardiance on her initial consultation in 03/2015; her average blood sugar prior to this was 250 with baseline A1c 10.6   Recent history:        INSULIN regimen: Tresiba 56 units daily, Humulin R units 30-35--45 times daily before meals  Non-insulin hypoglycemic regimen: Bydureon 2 mg weekly  Her A1c is still high at 9% as of 7/19, has been previously down to 7.3  Current blood sugar patterns from her glucose monitor download, current management and problems identified:  Her insulin was increased on her last visit and also recently on her telephone encounter  She has also been started on [place] after her visit in 7/19 and has now taking it 6 times  She does not have any nausea with this compared to previous and has gone up 5 pounds  She is trying to do a little walking at times also  Her highest blood sugars are in the evenings late at night after supper  However blood sugars are variable the rest of the time including blood sugars as low as 142 in the mornings  GLP-1 drugs  She is finding that she has significant increase  satiety with this and eating smaller portion  She feels better subjectively  Her largest meal is at lunch and blood sugars have not been checked much in the afternoon  She is still trying to take her Humulin R up to 30 minutes before eating    Side effects from medications have been: Metfomin causes diarrhea.  Candidiasis  from [place].  Nausea, palpitations from Trulicity  Compliance with the medical regimen: Improving Hypoglycemia: Never    Glucose monitoring:   Done about once or fewer times a day    Glucometer: One Touch.       Blood Glucose readings from download:   PRE-MEAL Fasting Lunch Dinner Bedtime Overall  Glucose range:  148-244    121-325   Mean/median:  185  163  200  226 190 +/-41   POST-MEAL PC Breakfast PC Lunch PC Dinner  Glucose range:     Mean/median:   206    Previous readings:  PRE-MEAL Fasting Lunch Dinner Bedtime Overall  Glucose range:  184-285      Mean/median:  205    255   POST-MEAL PC Breakfast PC Lunch PC Dinner  Glucose range:   269-331  191-383  Mean/median:          Self-care: The diet that the patient has been following is:  tries to limit sodas, fast food, fried foods .      Typical meal intake: Breakfast is mostly cereal.  Usually eating grilled chicken at lunch and not always consistent with diet                Dietician visit, most recent: 6/15               Exercise: walking about 3 twice a week  Weight history:  Wt Readings from Last 3 Encounters:  04/24/18 298 lb (135.2 kg)  04/13/18 290 lb (131.5 kg)  03/19/18 297 lb (134.7 kg)    Glycemic control:   Lab Results  Component Value Date   HGBA1C 9.0 (A) 02/20/2018   HGBA1C 9.1 11/27/2017   HGBA1C 7.3 07/03/2017   Lab Results  Component Value Date   MICROALBUR 1.0 11/27/2017   LDLCALC 122 (H) 11/27/2017   CREATININE 0.87 04/13/2018    Other problems discussed today: See review of systems   Allergies as of 04/24/2018      Reactions   Amoxicillin  Anaphylaxis, Other (See Comments)   Has patient had a PCN reaction causing immediate rash, facial/tongue/throat swelling, SOB or lightheadedness with hypotension: Yes Has patient had a PCN reaction causing severe rash involving mucus membranes or skin necrosis: No Has patient had a PCN reaction that required hospitalization No Has patient had a PCN reaction occurring within the last 10 years: No If all of the above answers are "NO", then may proceed with Cephalosporin use.   Penicillins Anaphylaxis, Other (See Comments)   Has patient had a PCN reaction causing immediate rash, facial/tongue/throat swelling, SOB or lightheadedness with hypotension: Yes Has patient had a PCN reaction causing severe rash involving mucus membranes or skin necrosis: No Has patient had a PCN reaction that required hospitalization No Has patient had a PCN reaction occurring within the last 10 years: No If all of the above answers are "NO", then may proceed with Cephalosporin use.   Dilaudid  [hydromorphone Hcl]    Latex Rash   Trulicity [dulaglutide] Nausea Only, Palpitations      Medication List        Accurate as of 04/24/18 11:59 PM. Always use your most recent med list.          cyclobenzaprine 10 MG tablet Commonly known as:  FLEXERIL Take 1 tablet (10 mg total) by mouth 3 (three) times daily as needed for muscle spasms.   Exenatide ER 2 MG/0.85ML Auij Inject 2 mg into the skin once a week.   fluvastatin XL 80 MG 24 hr tablet Commonly known as:  LESCOL XL Take 1 tablet (80 mg total) by mouth at bedtime.   insulin degludec 100 UNIT/ML Sopn FlexTouch Pen Commonly known as:  TRESIBA Inject 0.4 mLs (40 Units total) into the skin daily.   insulin regular human CONCENTRATED 500 UNIT/ML kwikpen Commonly known as:  HUMULIN R 15 Units, 30 minutes before each meal   metoprolol tartrate 50 MG tablet Commonly known as:  LOPRESSOR Take 1 tablet (50 mg total) by mouth as directed for 1 day. Take 1 tablet  50 mg 1 hour before procedure   traMADol 50 MG tablet Commonly known as:  ULTRAM Take 1 tablet (50 mg total) by mouth every 6 (six) hours as needed.       Allergies:  Allergies  Allergen Reactions  . Amoxicillin Anaphylaxis and Other (See Comments)    Has patient had a PCN reaction causing immediate rash, facial/tongue/throat swelling, SOB  or lightheadedness with hypotension: Yes Has patient had a PCN reaction causing severe rash involving mucus membranes or skin necrosis: No Has patient had a PCN reaction that required hospitalization No Has patient had a PCN reaction occurring within the last 10 years: No If all of the above answers are "NO", then may proceed with Cephalosporin use.  Marland Kitchen Penicillins Anaphylaxis and Other (See Comments)    Has patient had a PCN reaction causing immediate rash, facial/tongue/throat swelling, SOB or lightheadedness with hypotension: Yes Has patient had a PCN reaction causing severe rash involving mucus membranes or skin necrosis: No Has patient had a PCN reaction that required hospitalization No Has patient had a PCN reaction occurring within the last 10 years: No If all of the above answers are "NO", then may proceed with Cephalosporin use.  . Dilaudid  [Hydromorphone Hcl]   . Latex Rash  . Trulicity [Dulaglutide] Nausea Only and Palpitations    Past Medical History:  Diagnosis Date  . Complication of anesthesia    heartrate dropped with anesthesia  . Depressed   . Diabetes mellitus without complication ([place])   . Gallstones   . HTN (hypertension)   . Hyperglycemia   . Leukocytosis   . Low back pain   . Lump or mass in breast   . Malaise and fatigue   . Morbid obesity ([place])   . Sciatica   . Sleep apnea   . UTI (urinary tract infection)     Past Surgical History:  Procedure Laterality Date  . CHOLECYSTECTOMY  06/22/2012   Procedure: LAPAROSCOPIC CHOLECYSTECTOMY;  Surgeon: Jamesetta So, MD;  Location: [facility] ORS;  Service: General;   Laterality: N/A;  . ESOPHAGOGASTRODUODENOSCOPY N/A 06/09/2014   Procedure: ESOPHAGOGASTRODUODENOSCOPY (EGD);  Surgeon: Rogene Houston, MD;  Location: [facility] SU[facility];  Service: Endoscopy;  Laterality: N/A;  200-moved to 145 Ann to notify pt    Family History  Problem Relation Age of Onset  . Diabetes Mother   . Rheumatic fever Mother 51  . Migraines Father 55  . Hypertension Father   . Autism Brother   . Heart disease Neg Hx     Social History:  reports that she has never smoked. She has never used smokeless tobacco. She reports that she does not drink alcohol or use drugs.    Review of Systems    Lipid history:  has had muscle aches with pravastatin is being prescribed fluvastatin 80 mg However has not had adequate control with her last LDLs being high  States she cannot take Lescol daily because of muscle or joint pains and is taking it about 3 times a week  Lab Results  Component Value Date   CHOL 175 11/27/2017   HDL 38.60 (L) 11/27/2017   LDLCALC 122 (H) 11/27/2017   LDLDIRECT 112.0 07/19/2016   TRIG 75.0 11/27/2017   CHOLHDL 5 11/27/2017            She was recommended 5000 units of Vitamin D3 for vitamin D deficiency, followed by PCP   Lab Results  Component Value Date   VD25OH 23.05 (L) 07/19/2016   VD25OH 14.57 (L) 03/06/2016   Lab Results  Component Value Date   CALCIUM 8.6 (L) 04/13/2018       Physical Examination:  BP 132/88   Pulse 80   Ht 5\' 4"  (1.626 m)   Wt 298 lb (135.2 kg)   LMP 04/05/2018   SpO2 99%   BMI 51.15 kg/m      ASSESSMENT:  Diabetes type 2, uncontrolled with morbid obesity     See history of present illness for detailed discussion of current diabetes management, blood sugar patterns and problems identified  She is getting blood sugars averaging below  200 now at home but still inconsistently controlled including fasting despite 56 units of Tresiba and adding Bydureon However dietary compliance is better and her blood  sugars are not extremely high when she was symptomatic with the hyperglycemia No side effects or reactions with Bydureon which she is willing to continue  HYPERCHOLESTEROLEMIA: She is not taking her Lescol daily because of side effects Needs follow-up labs which can be done on the next visit   PLAN:  Continue BYDUREON 2 mg weekly Increase Tresiba up to 62 units and if morning sugars are still consistently at least over 160 she will go up another 6 units We will continue same doses of mealtime insulin since her intake is variable Recheck A1c on the next visit Consistent exercise    Patient Instructions  Tresiba 62 units and after 1 week are > 160 in am then go to [place] 04/26/2018, 9:27 AM   Note: This office note was prepared with Dragon voice recognition system technology. Any transcriptional errors that result from this process are unintentional.

## 2018-04-24 NOTE — Patient Instructions (Addendum)
Tresiba 62 units and after 1 week are > 160 in am then go to 66

## 2018-05-01 ENCOUNTER — Ambulatory Visit (HOSPITAL_COMMUNITY): Admission: RE | Admit: 2018-05-01 | Payer: PRIVATE HEALTH INSURANCE | Source: Ambulatory Visit

## 2018-05-01 ENCOUNTER — Ambulatory Visit (HOSPITAL_COMMUNITY)
Admission: RE | Admit: 2018-05-01 | Discharge: 2018-05-01 | Disposition: A | Discharge status: Home / Self Care | Payer: PRIVATE HEALTH INSURANCE | Source: Ambulatory Visit | Attending: Cardiovascular Disease | Admitting: Cardiovascular Disease

## 2018-05-01 DIAGNOSIS — R079 Chest pain, unspecified: Secondary | ICD-10-CM | POA: Insufficient documentation

## 2018-05-01 DIAGNOSIS — R072 Precordial pain: Secondary | ICD-10-CM

## 2018-05-01 MED ORDER — NITROGLYCERIN 0.4 MG SL SUBL
SUBLINGUAL_TABLET | SUBLINGUAL | Status: AC
  Administered 2018-05-01: 0.8 mg via SUBLINGUAL
  Filled 2018-05-01: qty 2

## 2018-05-01 MED ORDER — METOPROLOL TARTRATE 5 MG/5ML IV SOLN
10.0000 mg | INTRAVENOUS | Status: DC | PRN
  Administered 2018-05-01 (×2): 10 mg via INTRAVENOUS
  Filled 2018-05-01: qty 10

## 2018-05-01 MED ORDER — NITROGLYCERIN 0.4 MG SL SUBL
0.8000 mg | SUBLINGUAL_TABLET | SUBLINGUAL | Status: DC | PRN
  Administered 2018-05-01: 0.8 mg via SUBLINGUAL
  Filled 2018-05-01: qty 25

## 2018-05-01 MED ORDER — IOPAMIDOL (ISOVUE-370) INJECTION 76%
INTRAVENOUS | Status: AC
  Administered 2018-05-01: 80 mL via INTRAVENOUS
  Filled 2018-05-01: qty 100

## 2018-05-01 MED ORDER — METOPROLOL TARTRATE 5 MG/5ML IV SOLN
INTRAVENOUS | Status: AC
  Administered 2018-05-01: 10 mg via INTRAVENOUS
  Filled 2018-05-01: qty 20

## 2018-05-03 ENCOUNTER — Emergency Department (HOSPITAL_COMMUNITY)
Admission: EM | Admit: 2018-05-03 | Discharge: 2018-05-03 | Disposition: A | Discharge status: Home / Self Care | Payer: PRIVATE HEALTH INSURANCE | Attending: Emergency Medicine | Admitting: Emergency Medicine

## 2018-05-03 ENCOUNTER — Other Ambulatory Visit: Payer: Self-pay

## 2018-05-03 ENCOUNTER — Encounter (HOSPITAL_COMMUNITY): Payer: Self-pay | Admitting: *Deleted

## 2018-05-03 DIAGNOSIS — R21 Rash and other nonspecific skin eruption: Secondary | ICD-10-CM | POA: Diagnosis present

## 2018-05-03 DIAGNOSIS — Z5321 Procedure and treatment not carried out due to patient leaving prior to being seen by health care provider: Secondary | ICD-10-CM | POA: Insufficient documentation

## 2018-05-03 NOTE — ED Triage Notes (Signed)
Pt woke up Saturday with a rash all over body; pt had a procedure at [facility] with IVP dye the day before; pt states after the procedure she was very nauseous; pt states she took benadryl 50mg  at 4:30pm today; pt denies any sob or scratchy throat

## 2018-05-04 ENCOUNTER — Telehealth: Payer: Self-pay

## 2018-05-04 NOTE — ED Notes (Signed)
05/04/2018, Called for follow-up , no answer.

## 2018-05-04 NOTE — Telephone Encounter (Signed)
-----   Message from Herminio Commons, MD sent at 05/03/2018 10:11 PM EDT ----- Normal coronaries. No blockages. No cardiac abnormalities.

## 2018-05-04 NOTE — Telephone Encounter (Signed)
Pt notified of test results, states she is on day 3 now of hives and itching after cardiac CT.She is seeing her pcp now

## 2018-05-15 ENCOUNTER — Ambulatory Visit: Payer: PRIVATE HEALTH INSURANCE | Admitting: Physician Assistant

## 2018-07-03 ENCOUNTER — Ambulatory Visit: Payer: PRIVATE HEALTH INSURANCE | Admitting: Endocrinology

## 2018-07-21 ENCOUNTER — Telehealth: Payer: Self-pay | Admitting: Endocrinology

## 2018-07-21 NOTE — Telephone Encounter (Signed)
Pt stated that she she is taking bydureon 2mg  once weekly. Humulin R TID before meals 35 before breakfast, 25 before lunch, and 25 before supper, and taking Tresiba 40 units at HS. Pt was advised of MD message and pt wrote down instructions and was encouraged to call back for any issues in the future. Pt verbalized understanding.

## 2018-07-21 NOTE — Telephone Encounter (Signed)
Patient calling about her blood sugars be 300-400 range. Need to confirm what doses of insulin she is taking Please tell her to increase her Tresiba by 14 units from the current dose and the Humulin R by 10 units 3 times a day.  Make sure she is taking her Bydureon

## 2018-07-24 ENCOUNTER — Other Ambulatory Visit: Payer: Self-pay | Admitting: Endocrinology

## 2018-07-27 ENCOUNTER — Other Ambulatory Visit: Payer: Self-pay | Admitting: Endocrinology

## 2018-07-31 ENCOUNTER — Telehealth: Payer: Self-pay | Admitting: Endocrinology

## 2018-07-31 NOTE — Telephone Encounter (Signed)
error 

## 2018-08-11 ENCOUNTER — Telehealth: Payer: Self-pay

## 2018-08-11 NOTE — Telephone Encounter (Signed)
Called pt and left voicemail with Md instructions and request to call back with this information.

## 2018-08-11 NOTE — Telephone Encounter (Signed)
Pt called and wanted to know if Dr. Dwyane Dee would be okay with prescribing an antifungal r/t sugars running high.

## 2018-08-11 NOTE — Telephone Encounter (Signed)
What are her blood sugars in the last 3 days and what symptoms is she having of fungal infection.  Also need to know what insulin dose she is taking

## 2018-08-17 ENCOUNTER — Other Ambulatory Visit: Payer: Self-pay

## 2018-08-17 ENCOUNTER — Encounter: Payer: Self-pay | Admitting: Endocrinology

## 2018-08-17 ENCOUNTER — Ambulatory Visit (INDEPENDENT_AMBULATORY_CARE_PROVIDER_SITE_OTHER): Payer: PRIVATE HEALTH INSURANCE | Admitting: Endocrinology

## 2018-08-17 VITALS — BP 122/84 | HR 80 | Ht 64.0 in | Wt 298.6 lb

## 2018-08-17 DIAGNOSIS — E1165 Type 2 diabetes mellitus with hyperglycemia: Secondary | ICD-10-CM | POA: Diagnosis not present

## 2018-08-17 DIAGNOSIS — Z794 Long term (current) use of insulin: Secondary | ICD-10-CM | POA: Diagnosis not present

## 2018-08-17 DIAGNOSIS — E7800 Pure hypercholesterolemia, unspecified: Secondary | ICD-10-CM | POA: Diagnosis not present

## 2018-08-17 LAB — LIPID PANEL
CHOL/HDL RATIO: 4
CHOLESTEROL: 178 mg/dL (ref 0–200)
HDL: 39.6 mg/dL (ref >39.00)
LDL CALC: 118 mg/dL — AB (ref 0–99)
NonHDL: 138.5
TRIGLYCERIDES: 105 mg/dL (ref 0.0–149.0)
VLDL: 21 mg/dL (ref 0.0–40.0)

## 2018-08-17 LAB — COMPREHENSIVE METABOLIC PANEL
ALT: 22 U/L (ref 0–35)
AST: 18 U/L (ref 0–37)
Albumin: 3.8 g/dL (ref 3.5–5.2)
Alkaline Phosphatase: 64 U/L (ref 39–117)
BUN: 14 mg/dL (ref 6–23)
CO2: 29 mEq/L (ref 19–32)
Calcium: 9.1 mg/dL (ref 8.4–10.5)
Chloride: 103 mEq/L (ref 96–112)
Creatinine, Ser: 0.85 mg/dL (ref 0.40–1.20)
GFR: 95.77 mL/min (ref >60.00)
Glucose, Bld: 183 mg/dL — ABNORMAL HIGH (ref 70–99)
Potassium: 3.8 mEq/L (ref 3.5–5.1)
Sodium: 137 mEq/L (ref 135–145)
Total Bilirubin: 0.4 mg/dL (ref 0.2–1.2)
Total Protein: 7.2 g/dL (ref 6.0–8.3)

## 2018-08-17 LAB — POCT GLYCOSYLATED HEMOGLOBIN (HGB A1C): Hemoglobin A1C: 8.3 % — AB (ref 4.0–5.6)

## 2018-08-17 MED ORDER — INSULIN DEGLUDEC 100 UNIT/ML ~~LOC~~ SOPN: 54.0000 [IU] | PEN_INJECTOR | Freq: Every day | SUBCUTANEOUS | 3 refills | Status: DC

## 2018-08-17 MED ORDER — EXENATIDE ER 2 MG/0.85ML ~~LOC~~ AUIJ: 2.0000 mg | AUTO-INJECTOR | SUBCUTANEOUS | 0 refills | Status: DC

## 2018-08-17 NOTE — Progress Notes (Signed)
Patient ID: Julie Berry, female   DOB: January 27, 1981, 37 y.o.   MRN: [id number]           Reason for Appointment: follow-up for Type 2 Diabetes  Referring physician: Gerarda Fraction  History of Present Illness:          Date of diagnosis of type 2 diabetes mellitus:   11/2013       Background history:  She thinks she had blood sugars of about 300 when she was first diagnosed; although she was complaining of some fatigue she was diagnosed with routine labs on her annual visit.  Her records indicate she had an A1c of 6.3 in 12/2011 She was started on metformin twice a day but she could not tolerate it well because of diarrhea.  She thinks she tried to take it for about 6 months with some improvement in her blood sugars, subsequently stopped.  Because she had persistently high blood sugars even with starting glipizide she was started on a regimen of Victoza and Jardiance on her initial consultation in 03/2015; her average blood sugar prior to this was 250 with baseline A1c 10.6   Recent history:        INSULIN regimen: Tresiba 54 units daily, Humulin R units 45-35--45 times daily before meals  Non-insulin hypoglycemic regimen: Bydureon 2 mg weekly  Her A1c is still high at 8.3, has been previously down to 7.3  Current blood sugar patterns from her glucose monitor download, current management and problems identified:  Her insulin was increased on her last visit but she when she had called about her high sugars about 4 weeks ago she had been taking a much smaller dose of [place] and not the proper dose of regular insulin at suppertime  However even with increasing the doses earlier this month her blood sugars are still mostly over 200 and higher than on her last visit  No side effects from Bydureon currently  Her weight has gone up 18 pounds likely to be from stopping her walking program and not inconsistent with her diet  Most of her blood sugars are before breakfast and before or after  dinner  She is still trying to take her Humulin R up to 30 minutes before eating   Side effects from medications have been: Metfomin causes diarrhea.  Candidiasis  from [place].  Nausea, palpitations from Trulicity  Compliance with the medical regimen: Improving Hypoglycemia: Never    Glucose monitoring:  1-2 times daily   Glucometer: One Touch.       Blood Glucose readings from download:    PRE-MEAL Fasting Lunch  evening Bedtime Overall  Glucose range:  169-333   146-375  139-329   Mean/median:  230  308  240  257  245   Previous readings:  PRE-MEAL Fasting Lunch Dinner Bedtime Overall  Glucose range:  148-244    121-325   Mean/median:  185  163  200  226 190 +/-41   POST-MEAL PC Breakfast PC Lunch PC Dinner  Glucose range:     Mean/median:   206      Self-care: The diet that the patient has been following is: tries to limit sodas, fast food, fried foods .      Typical meal intake: Breakfast is mostly egg/ toast.  Usually eating grilled chicken at lunch and not always consistent with diet                Dietician visit, most recent: 6/15  Exercise: walking less recently  Weight history:  Wt Readings from Last 3 Encounters:  08/17/18 298 lb 9.6 oz (135.4 kg)  05/03/18 280 lb (127 kg)  04/24/18 298 lb (135.2 kg)    Glycemic control:   Lab Results  Component Value Date   HGBA1C 8.3 (A) 08/17/2018   HGBA1C 9.0 (A) 02/20/2018   HGBA1C 9.1 11/27/2017   Lab Results  Component Value Date   MICROALBUR 1.0 11/27/2017   LDLCALC 122 (H) 11/27/2017   CREATININE 0.87 04/13/2018    Other problems discussed today: See review of systems   Allergies as of 08/17/2018      Reactions   Amoxicillin Anaphylaxis, Other (See Comments)   Has patient had a PCN reaction causing immediate rash, facial/tongue/throat swelling, SOB or lightheadedness with hypotension: Yes Has patient had a PCN reaction causing severe rash involving mucus membranes or skin  necrosis: No Has patient had a PCN reaction that required hospitalization No Has patient had a PCN reaction occurring within the last 10 years: No If all of the above answers are "NO", then may proceed with Cephalosporin use.   Penicillins Anaphylaxis, Other (See Comments)   Has patient had a PCN reaction causing immediate rash, facial/tongue/throat swelling, SOB or lightheadedness with hypotension: Yes Has patient had a PCN reaction causing severe rash involving mucus membranes or skin necrosis: No Has patient had a PCN reaction that required hospitalization No Has patient had a PCN reaction occurring within the last 10 years: No If all of the above answers are "NO", then may proceed with Cephalosporin use.   Dilaudid  [hydromorphone Hcl]    Contrast Media [iodinated Diagnostic Agents] Hives   CT Contrast, had hives for 3 days after cardiac CT   Latex Rash   Trulicity [dulaglutide] Nausea Only, Palpitations      Medication List       Accurate as of August 17, 2018 11:15 AM. Always use your most recent med list.        BYDUREON BCISE 2 MG/0.85ML Au[place] Generic drug:  Exenatide ER INJECT 2MG  INTO THE SKIN ONCE WEEKLY.   fluvastatin XL 80 MG 24 hr tablet Commonly known as:  LESCOL XL Take 1 tablet (80 mg total) by mouth at bedtime.   insulin regular human CONCENTRATED 500 UNIT/ML kwikpen Commonly known as:  HUMULIN R U-500 KWIKPEN 15 Units, 30 minutes before each meal   TRESIBA FLEXTOUCH 100 UNIT/ML Sopn FlexTouch Pen Generic drug:  insulin degludec INJECT 40 UNITS SUBCUTANEOUSLY DAILY.       Allergies:  Allergies  Allergen Reactions  . Amoxicillin Anaphylaxis and Other (See Comments)    Has patient had a PCN reaction causing immediate rash, facial/tongue/throat swelling, SOB or lightheadedness with hypotension: Yes Has patient had a PCN reaction causing severe rash involving mucus membranes or skin necrosis: No Has patient had a PCN reaction that required  hospitalization No Has patient had a PCN reaction occurring within the last 10 years: No If all of the above answers are "NO", then may proceed with Cephalosporin use.  Marland Kitchen Penicillins Anaphylaxis and Other (See Comments)    Has patient had a PCN reaction causing immediate rash, facial/tongue/throat swelling, SOB or lightheadedness with hypotension: Yes Has patient had a PCN reaction causing severe rash involving mucus membranes or skin necrosis: No Has patient had a PCN reaction that required hospitalization No Has patient had a PCN reaction occurring within the last 10 years: No If all of the above answers are "NO", then may proceed  with Cephalosporin use.  . Dilaudid  [Hydromorphone Hcl]   . Contrast Media [Iodinated Diagnostic Agents] Hives    CT Contrast, had hives for 3 days after cardiac CT  . Latex Rash  . Trulicity [Dulaglutide] Nausea Only and Palpitations    Past Medical History:  Diagnosis Date  . Complication of anesthesia    heartrate dropped with anesthesia  . Depressed   . Diabetes mellitus without complication ([place])   . Gallstones   . HTN (hypertension)   . Hyperglycemia   . Leukocytosis   . Low back pain   . Lump or mass in breast   . Malaise and fatigue   . Morbid obesity ([place])   . Sciatica   . Sleep apnea   . UTI (urinary tract infection)     Past Surgical History:  Procedure Laterality Date  . CHOLECYSTECTOMY  06/22/2012   Procedure: LAPAROSCOPIC CHOLECYSTECTOMY;  Surgeon: Jamesetta So, MD;  Location: [facility];  Service: General;  Laterality: N/A;  . ESOPHAGOGASTRODUODENOSCOPY N/A 06/09/2014   Procedure: ESOPHAGOGASTRODUODENOSCOPY (EGD);  Surgeon: Rogene Houston, MD;  Location: [facility];  Service: Endoscopy;  Laterality: N/A;  200-moved to 145 Ann to notify pt    Family History  Problem Relation Age of Onset  . Diabetes Mother   . Rheumatic fever Mother 27  . Migraines Father 14  . Hypertension Father   . Autism Brother   . Heart disease Neg  Hx     Social History:  reports that she has never smoked. She has never used smokeless tobacco. She reports that she does not drink alcohol or use drugs.    Review of Systems    Lipid history:  has had muscle aches with pravastatin is being prescribed fluvastatin 80 mg However has not had adequate control with her last LDLs being high  Previously she has had that she cannot take Lescol daily because of muscle or joint pains but she thinks she is taking it daily now  Lab Results  Component Value Date   CHOL 175 11/27/2017   HDL 38.60 (L) 11/27/2017   LDLCALC 122 (H) 11/27/2017   LDLDIRECT 112.0 07/19/2016   TRIG 75.0 11/27/2017   CHOLHDL 5 11/27/2017            She was recommended 5000 units of Vitamin D3 for vitamin D deficiency, followed by PCP   Lab Results  Component Value Date   VD25OH 23.05 (L) 07/19/2016   VD25OH 14.57 (L) 03/06/2016   Lab Results  Component Value Date   CALCIUM 8.6 (L) 04/13/2018       Physical Examination:  BP 122/84 (BP Location: Left Arm, Patient Position: Sitting, Cuff Size: Normal)   Pulse 80   Ht 5\' 4"  (1.626 m)   Wt 298 lb 9.6 oz (135.4 kg)   SpO2 99%   BMI 51.25 kg/m      ASSESSMENT:  Diabetes type 2, uncontrolled with morbid obesity     See history of present illness for detailed discussion of current diabetes management, blood sugar patterns and problems identified  She is getting blood sugars averaging below  200 now at home but still inconsistently controlled including fasting despite 56 units of Tresiba and adding Bydureon However dietary compliance is better and her blood sugars are not extremely high when she was symptomatic with the hyperglycemia No side effects or reactions with Bydureon which she is willing to continue  HYPERCHOLESTEROLEMIA: She is reportedly taking Lescol now Needs follow-up labs today  PLAN:  Increase insulin doses: She will need to increase her Tresiba by 10 units up to 64 She will go  up on her HUMULIN U-500 up to 50 units in the morning and evening and 40 units at lunch Continue BYDUREON 2 mg weekly Given her on her ideas about exercising at home but needs to do this regularly Consistent diet Need to review blood sugars in 6 weeks She will call if blood sugars are not better in 2 weeks  There are no Patient Instructions on file for this visit.      Elayne Snare 08/17/2018, 11:15 AM   Note: This office note was prepared with Dragon voice recognition system technology. Any transcriptional errors that result from this process are unintentional.

## 2018-08-17 NOTE — Patient Instructions (Addendum)
Tresiba 64 units daily, Humulin R units 50-40--50 times daily before meals  Check blood sugars on waking up 5 days a week  Also check blood sugars about 2 hours after meals and do this after different meals by rotation  Recommended blood sugar levels on waking up are 90-130 and about 2 hours after meal is 130-180  Please bring your blood sugar monitor to each visit, thank you      INDOOR EXERCISE IDEAS   Use the following examples for a creative indoor workout (perform each move for 2-3 minutes):   Warm up. Put on some music that makes you feel like moving, and dance around the living room.  Watch exercise shows on TV and move along with them. There are tons of free cable channels that have daily exercise shows on them for all levels - beginner through advanced.   You can easily find a number of exercise videos but use one that will suit your liking and exercise level; you can do these on your own schedule.  Walk up and down the steps.  Do dumbbell curls and presses (if you don't have weights, use full water bottles).  Do assisted squats, keeping your back on a fitness ball against the wall or using the back of the couch for support.  Shadow box: Lift and lower the left leg; jab with the right arm, then the left; then lift and lower the right leg.  Fence (you don't even need swords). Pretend you're holding a sword in each hand. Create an X pattern standing still, then moving forward and back.  Hop on your exercise bike or treadmill -- or, for something different, use a weighted hula hoop. If you don't have any of those, just go back to dancing.  Do abdominal crunches (hold a weighted ball for added resistance).  Cool down with [employer] "I Feel Good" -- or whatever tune makes you feel good

## 2018-08-20 ENCOUNTER — Other Ambulatory Visit: Payer: Self-pay

## 2018-08-20 MED ORDER — ROSUVASTATIN CALCIUM 5 MG PO TABS: 5.0000 mg | ORAL_TABLET | Freq: Every day | ORAL | 3 refills | Status: DC

## 2018-08-24 ENCOUNTER — Other Ambulatory Visit: Payer: Self-pay | Admitting: Endocrinology

## 2018-08-28 ENCOUNTER — Telehealth: Payer: Self-pay | Admitting: Endocrinology

## 2018-08-28 NOTE — Telephone Encounter (Signed)
Cholesterol medication will not cause a cough

## 2018-08-28 NOTE — Telephone Encounter (Signed)
Please advise 

## 2018-08-28 NOTE — Telephone Encounter (Signed)
error 

## 2018-08-28 NOTE — Telephone Encounter (Signed)
Patient would like to know the side of effects of her cholesterol medication that Dr Dwyane Dee has prescribed for her  .  She stated she does not have a cold but has a dry cough that started when she started taking this medication   Please advise

## 2018-08-31 NOTE — Telephone Encounter (Signed)
Called pt and gave her MD message. Pt verbalized understanding. 

## 2018-09-21 ENCOUNTER — Telehealth: Payer: Self-pay | Admitting: Endocrinology

## 2018-09-21 NOTE — Telephone Encounter (Signed)
Patient called re: can patient take [employer]. Please call patient at ph# [phone] to advise.

## 2018-09-21 NOTE — Telephone Encounter (Signed)
I do not know what this contains or what benefit she will get from this.  She can discuss with her PCP

## 2018-09-21 NOTE — Telephone Encounter (Signed)
Called pt and gave her MD message. Pt verbalized understanding. 

## 2018-09-21 NOTE — Telephone Encounter (Signed)
Please advise 

## 2018-09-28 ENCOUNTER — Other Ambulatory Visit: Payer: Self-pay | Admitting: Endocrinology

## 2018-09-29 ENCOUNTER — Ambulatory Visit: Payer: PRIVATE HEALTH INSURANCE | Admitting: Endocrinology

## 2018-09-30 ENCOUNTER — Other Ambulatory Visit: Payer: Self-pay | Admitting: Endocrinology

## 2018-10-01 ENCOUNTER — Other Ambulatory Visit: Payer: Self-pay

## 2018-10-01 MED ORDER — FLUCONAZOLE 150 MG PO TABS: ORAL_TABLET | ORAL | 0 refills | Status: DC

## 2018-10-01 NOTE — Telephone Encounter (Signed)
Called and left detailed voicemail for pt. 

## 2018-10-01 NOTE — Telephone Encounter (Signed)
Pt is taking the following. Bydureon 2mg  every Saturday Tresiba 64 units at HS  Humulin R U500 50 units at breakfast, 40 units at lunch, and 50 units at dinner.

## 2018-10-01 NOTE — Telephone Encounter (Signed)
Need to know what insulin doses she is taking

## 2018-10-01 NOTE — Telephone Encounter (Signed)
Called and left voicemail for pt to return call. 1 time dose of Diflucan 150mg .

## 2018-10-01 NOTE — Telephone Encounter (Signed)
She can have Diflucan but need to know if she is following the directions for insulin from the last visit

## 2018-10-01 NOTE — Telephone Encounter (Signed)
Ok to refill 

## 2018-10-01 NOTE — Telephone Encounter (Signed)
Pt is aware.  

## 2018-10-01 NOTE — Telephone Encounter (Signed)
LMTCB to gain clarity on why this is to be refilled

## 2018-10-01 NOTE — Telephone Encounter (Signed)
She is not on Jardiance, no reason why she should be getting a yeast infection.  Please clarify the reason

## 2018-10-01 NOTE — Telephone Encounter (Signed)
Pt stated that even though she is not on Jardiance anymore, she is still having issues with a yeast infection. She stated that her blood sugars are still consistently in the 200 range, despite dieting.

## 2018-10-01 NOTE — Telephone Encounter (Signed)
Please call: She needs to increase all her doses by 10 units except at lunch

## 2018-10-14 ENCOUNTER — Ambulatory Visit: Payer: PRIVATE HEALTH INSURANCE | Admitting: Endocrinology

## 2018-10-21 ENCOUNTER — Telehealth: Payer: Self-pay | Admitting: Endocrinology

## 2018-10-21 NOTE — Telephone Encounter (Signed)
Denzil Hughes stated Feb 25 they refaxed a confirmation sheet about changes to a patients medication. We will refax today.

## 2018-10-21 NOTE — Telephone Encounter (Signed)
Paperwork was not received on the 25'th. Paperwork was however, received on 10/21/2018 once the office opened for a detailed written order for glucose supplies. This form will be filled out and sent back.

## 2018-10-26 ENCOUNTER — Other Ambulatory Visit: Payer: Self-pay | Admitting: Endocrinology

## 2018-10-26 ENCOUNTER — Ambulatory Visit (INDEPENDENT_AMBULATORY_CARE_PROVIDER_SITE_OTHER): Payer: PRIVATE HEALTH INSURANCE | Admitting: Endocrinology

## 2018-10-26 ENCOUNTER — Ambulatory Visit: Payer: PRIVATE HEALTH INSURANCE | Admitting: Endocrinology

## 2018-10-26 ENCOUNTER — Other Ambulatory Visit: Payer: Self-pay

## 2018-10-26 ENCOUNTER — Encounter: Payer: Self-pay | Admitting: Endocrinology

## 2018-10-26 VITALS — BP 120/74 | HR 84 | Ht 64.0 in | Wt 299.2 lb

## 2018-10-26 DIAGNOSIS — E1165 Type 2 diabetes mellitus with hyperglycemia: Secondary | ICD-10-CM | POA: Diagnosis not present

## 2018-10-26 DIAGNOSIS — Z794 Long term (current) use of insulin: Secondary | ICD-10-CM | POA: Diagnosis not present

## 2018-10-26 MED ORDER — FREESTYLE LIBRE 14 DAY READER DEVI: 1.0000 | Freq: Once | 0 refills | Status: AC

## 2018-10-26 MED ORDER — FREESTYLE LIBRE 14 DAY SENSOR MISC: 1.0000 [IU] | 4 refills | Status: DC

## 2018-10-26 NOTE — Progress Notes (Addendum)
Patient ID: Julie Berry, female   DOB: 08-27-1980, 38 y.o.   MRN: [id number]           Reason for Appointment: follow-up for Type 2 Diabetes  Referring physician: Gerarda Fraction  History of Present Illness:          Date of diagnosis of type 2 diabetes mellitus:   11/2013       Background history:  She thinks she had blood sugars of about 300 when she was first diagnosed; although she was complaining of some fatigue she was diagnosed with routine labs on her annual visit.  Her records indicate she had an A1c of 6.3 in 12/2011 She was started on metformin twice a day but she could not tolerate it well because of diarrhea.  She thinks she tried to take it for about 6 months with some improvement in her blood sugars, subsequently stopped.  Because she had persistently high blood sugars even with starting glipizide she was started on a regimen of Victoza and Jardiance on her initial consultation in 03/2015; her average blood sugar prior to this was 250 with baseline A1c 10.6   Recent history:        INSULIN regimen: Tresiba 74 units daily, Humulin R units 50-50--60 times daily before meals  Non-insulin hypoglycemic regimen: Bydureon 2 mg weekly  Her A1c is still high at 8.3, has been previously down to 7.3  Current blood sugar patterns from her glucose monitor download, current management and problems identified:  Her insulin doses have been increased progressively on each visit  However she continues to have average blood sugars well over 200 at home  Only occasionally will have a good reading but most of her readings are usually over 200 and not much different between morning and evening  Her dietary history was reviewed and she appears to be eating relatively small meals with low carbohydrate intake and not excessive snacks or fat intake  She says she is trying to walk 2 times a week  Also continuing to take Bydureon  She thinks she is compliant with her insulin regimen including the  regular insulin before eating  Her weight is about the same as in December   Side effects from medications have been: Metfomin causes diarrhea.  Candidiasis  from [place].  Nausea, palpitations from Trulicity  Compliance with the medical regimen: Improving Hypoglycemia: Never    Glucose monitoring:  1-2 times daily   Glucometer: One Touch.       Blood Glucose readings from download:    PRE-MEAL Fasting Lunch Dinner Bedtime Overall  Glucose range:  147-276      Mean/median:  219  302  208  231 +/-150   POST-MEAL PC Breakfast PC Lunch PC Dinner  Glucose range:    140-337  Mean/median:    247     Previous readings:  PRE-MEAL Fasting Lunch  evening Bedtime Overall  Glucose range:  169-333   146-375  139-329   Mean/median:  230  308  240  257  245      Self-care: The diet that the patient has been following is: tries to limit sodas, fast food, fried foods .      Typical meal intake: Breakfast is mostly egg/ toast.  Usually eating grilled chicken at lunch and not always consistent with diet                Dietician visit, most recent: 6/15  Exercise: walking recently  Weight history:  Wt Readings from Last 3 Encounters:  10/26/18 299 lb 3.2 oz (135.7 kg)  08/17/18 298 lb 9.6 oz (135.4 kg)  05/03/18 280 lb (127 kg)    Glycemic control:   Lab Results  Component Value Date   HGBA1C 8.3 (A) 08/17/2018   HGBA1C 9.0 (A) 02/20/2018   HGBA1C 9.1 11/27/2017   Lab Results  Component Value Date   MICROALBUR 1.0 11/27/2017   LDLCALC 118 (H) 08/17/2018   CREATININE 0.85 08/17/2018    Other problems discussed today: See review of systems   Allergies as of 10/26/2018      Reactions   Amoxicillin Anaphylaxis, Other (See Comments)   Has patient had a PCN reaction causing immediate rash, facial/tongue/throat swelling, SOB or lightheadedness with hypotension: Yes Has patient had a PCN reaction causing severe rash involving mucus membranes or skin  necrosis: No Has patient had a PCN reaction that required hospitalization No Has patient had a PCN reaction occurring within the last 10 years: No If all of the above answers are "NO", then may proceed with Cephalosporin use.   Penicillins Anaphylaxis, Other (See Comments)   Has patient had a PCN reaction causing immediate rash, facial/tongue/throat swelling, SOB or lightheadedness with hypotension: Yes Has patient had a PCN reaction causing severe rash involving mucus membranes or skin necrosis: No Has patient had a PCN reaction that required hospitalization No Has patient had a PCN reaction occurring within the last 10 years: No If all of the above answers are "NO", then may proceed with Cephalosporin use.   Dilaudid  [hydromorphone Hcl]    Contrast Media [iodinated Diagnostic Agents] Hives   CT Contrast, had hives for 3 days after cardiac CT   Latex Rash   Trulicity [dulaglutide] Nausea Only, Palpitations      Medication List       Accurate as of October 26, 2018  2:58 PM. Always use your most recent med list.        Bydureon BCise 2 MG/0.85ML Auij Generic drug:  Exenatide ER INJECT 2MG  INTO THE SKIN ONCE WEEKLY.   fluconazole 150 MG tablet Commonly known as:  DIFLUCAN TAKE 1 TABLET BY MOUTH ONCE DAILY AS A 1 TIMES DOSE.   fluvastatin XL 80 MG 24 hr tablet Commonly known as:  LESCOL XL Take 1 tablet (80 mg total) by mouth at bedtime.   FreeStyle Libre 14 Day Reader Kerrin Mo 1 Device by Does not apply route once for 1 dose.   FreeStyle Libre 14 Day Sensor Misc 1 Units by Does not apply route every 14 (fourteen) days.   insulin regular human CONCENTRATED 500 UNIT/ML kwikpen Commonly known as:  HumuLIN R U-500 KwikPen Inject 45 units at breakfast, and 35 units at lunch and dinner.   Tyler Aas FlexTouch 100 UNIT/ML Sopn FlexTouch Pen Generic drug:  insulin degludec INJECT 40 UNITS SUBCUTANEOUSLY DAILY.       Allergies:  Allergies  Allergen Reactions  . Amoxicillin  Anaphylaxis and Other (See Comments)    Has patient had a PCN reaction causing immediate rash, facial/tongue/throat swelling, SOB or lightheadedness with hypotension: Yes Has patient had a PCN reaction causing severe rash involving mucus membranes or skin necrosis: No Has patient had a PCN reaction that required hospitalization No Has patient had a PCN reaction occurring within the last 10 years: No If all of the above answers are "NO", then may proceed with Cephalosporin use.  Marland Kitchen Penicillins Anaphylaxis and Other (See Comments)  Has patient had a PCN reaction causing immediate rash, facial/tongue/throat swelling, SOB or lightheadedness with hypotension: Yes Has patient had a PCN reaction causing severe rash involving mucus membranes or skin necrosis: No Has patient had a PCN reaction that required hospitalization No Has patient had a PCN reaction occurring within the last 10 years: No If all of the above answers are "NO", then may proceed with Cephalosporin use.  . Dilaudid  [Hydromorphone Hcl]   . Contrast Media [Iodinated Diagnostic Agents] Hives    CT Contrast, had hives for 3 days after cardiac CT  . Latex Rash  . Trulicity [Dulaglutide] Nausea Only and Palpitations    Past Medical History:  Diagnosis Date  . Complication of anesthesia    heartrate dropped with anesthesia  . Depressed   . Diabetes mellitus without complication ([place])   . Gallstones   . HTN (hypertension)   . Hyperglycemia   . Leukocytosis   . Low back pain   . Lump or mass in breast   . Malaise and fatigue   . Morbid obesity ([place])   . Sciatica   . Sleep apnea   . UTI (urinary tract infection)     Past Surgical History:  Procedure Laterality Date  . CHOLECYSTECTOMY  06/22/2012   Procedure: LAPAROSCOPIC CHOLECYSTECTOMY;  Surgeon: Jamesetta So, MD;  Location: [facility];  Service: General;  Laterality: N/A;  . ESOPHAGOGASTRODUODENOSCOPY N/A 06/09/2014   Procedure: ESOPHAGOGASTRODUODENOSCOPY (EGD);  Surgeon:  Rogene Houston, MD;  Location: [facility];  Service: Endoscopy;  Laterality: N/A;  200-moved to 145 Ann to notify pt    Family History  Problem Relation Age of Onset  . Diabetes Mother   . Rheumatic fever Mother 21  . Migraines Father 62  . Hypertension Father   . Autism Brother   . Heart disease Neg Hx     Social History:  reports that she has never smoked. She has never used smokeless tobacco. She reports that she does not drink alcohol or use drugs.    Review of Systems    Lipid history:  has had muscle aches with pravastatin is being prescribed fluvastatin 80 mg However has not had adequate control with her last LDL still being high  Previously she has had that she cannot take Lescol daily because of muscle or joint pains but she confirms she is taking it daily now  Lab Results  Component Value Date   CHOL 178 08/17/2018   HDL 39.60 08/17/2018   LDLCALC 118 (H) 08/17/2018   LDLDIRECT 112.0 07/19/2016   TRIG 105.0 08/17/2018   CHOLHDL 4 08/17/2018            She was recommended 5000 units of Vitamin D3 for vitamin D deficiency, followed by PCP   Lab Results  Component Value Date   VD25OH 23.05 (L) 07/19/2016   VD25OH 14.57 (L) 03/06/2016   Lab Results  Component Value Date   CALCIUM 9.1 08/17/2018       Physical Examination:  BP 120/74 (BP Location: Left Arm, Patient Position: Sitting, Cuff Size: Large)   Pulse 84   Ht 5\' 4"  (1.626 m)   Wt 299 lb 3.2 oz (135.7 kg)   SpO2 96%   BMI 51.36 kg/m      ASSESSMENT:  Diabetes type 2, uncontrolled with morbid obesity     See history of present illness for detailed discussion of current diabetes management, blood sugar patterns and problems identified  Her last A1c was 8.3  Although her blood sugars may have been somewhat better last visit she is now getting higher readings with average over 200 throughout the day This is despite taking about 240 units of insulin a day Also on Bydureon She appears  to have a fairly good diet Has some exercise in her routine but not enough  HYPERCHOLESTEROLEMIA: She is taking Lescol and is intolerant to at least 2 other statins, will continue the same regimen even though LDL is not below 100  Recurrent vaginal yeast infections related to hyperglycemia  PLAN:  Increase insulin doses by 10 units on the regular insulin for now and she will take 60 units at breakfast and lunch and 70 at dinner Introduced her the concept of the insulin pump and she was shown how the Omni pod insulin would be used to deliver insulin Most likely she can have better control with lower dose of insulin with the pump especially with using the U-500 Encourage her to increase exercise She will use the freestyle libre sensor to monitor her sugar, discussed how this would be used and information provided, she can call for assistance with starting this if needed  She will look into the insurance coverage for the Omni pod pump  She can try to use Monistat cream for yeast infections in between any doses of Diflucan that may be needed  Otherwise follow-up in 6 weeks  Patient Instructions  Tresiba 74 units daily, Humulin R units 60-60 --70 times daily before meals  Miconazole for yeast   Check blood sugars on waking up days a week  Also check blood sugars about 2 hours after meals and do this after different meals by rotation  Recommended blood sugar levels on waking up are 90-130 and about 2 hours after meal is 130-180  Please bring your blood sugar monitor to each visit, thank you     Counseling time on subjects discussed in assessment and plan sections is over 50% of today's 25 minute visit     Elayne Snare 10/26/2018, 2:58 PM   Note: This office note was prepared with Dragon voice recognition system technology. Any transcriptional errors that result from this process are unintentional.

## 2018-10-26 NOTE — Patient Instructions (Addendum)
Tresiba 74 units daily, Humulin R units 60-60 --70 times daily before meals  Miconazole for yeast   Check blood sugars on waking up days a week  Also check blood sugars about 2 hours after meals and do this after different meals by rotation  Recommended blood sugar levels on waking up are 90-130 and about 2 hours after meal is 130-180  Please bring your blood sugar monitor to each visit, thank you

## 2018-11-25 ENCOUNTER — Telehealth: Payer: Self-pay | Admitting: Endocrinology

## 2018-11-25 NOTE — Telephone Encounter (Signed)
Patient stated that she is having huge side effects with her bydureon, swelling to the left side of her face and extreme back aches.  Please Advise, Thanks

## 2018-11-26 NOTE — Telephone Encounter (Signed)
Please advise 

## 2018-11-26 NOTE — Telephone Encounter (Signed)
Pt called back and stated that weekly when she takes the injection, she begins experiencing these symptoms and they last for approx. 2 days and then go away until her next injection.

## 2018-11-26 NOTE — Telephone Encounter (Signed)
She can leave it off until her next visit but if her blood sugars go up a lot we may have to resume.  She should note that she has been taking this for over a year

## 2018-11-26 NOTE — Telephone Encounter (Signed)
That is not a side effect of Bydureon, she needs to discuss with PCP

## 2018-11-26 NOTE — Telephone Encounter (Signed)
Called pt and gave her MD message. PT verbalized understanding. 

## 2018-11-26 NOTE — Telephone Encounter (Signed)
Called and left detailed voicemail with MD message.

## 2018-11-30 ENCOUNTER — Other Ambulatory Visit: Payer: Self-pay | Admitting: Endocrinology

## 2018-11-30 ENCOUNTER — Other Ambulatory Visit (INDEPENDENT_AMBULATORY_CARE_PROVIDER_SITE_OTHER): Payer: PRIVATE HEALTH INSURANCE

## 2018-11-30 ENCOUNTER — Other Ambulatory Visit: Payer: Self-pay

## 2018-11-30 DIAGNOSIS — E1165 Type 2 diabetes mellitus with hyperglycemia: Secondary | ICD-10-CM | POA: Diagnosis not present

## 2018-11-30 DIAGNOSIS — Z794 Long term (current) use of insulin: Secondary | ICD-10-CM | POA: Diagnosis not present

## 2018-11-30 DIAGNOSIS — E7800 Pure hypercholesterolemia, unspecified: Secondary | ICD-10-CM

## 2018-11-30 LAB — COMPREHENSIVE METABOLIC PANEL
ALT: 16 U/L (ref 0–35)
AST: 12 U/L (ref 0–37)
Albumin: 3.6 g/dL (ref 3.5–5.2)
Alkaline Phosphatase: 62 U/L (ref 39–117)
BUN: 18 mg/dL (ref 6–23)
CO2: 27 mEq/L (ref 19–32)
Calcium: 9.2 mg/dL (ref 8.4–10.5)
Chloride: 102 mEq/L (ref 96–112)
Creatinine, Ser: 0.94 mg/dL (ref 0.40–1.20)
GFR: 80.11 mL/min (ref >60.00)
Glucose, Bld: 167 mg/dL — ABNORMAL HIGH (ref 70–99)
Potassium: 4.2 mEq/L (ref 3.5–5.1)
Sodium: 138 mEq/L (ref 135–145)
Total Bilirubin: 0.4 mg/dL (ref 0.2–1.2)
Total Protein: 6.7 g/dL (ref 6.0–8.3)

## 2018-11-30 LAB — LIPID PANEL
Cholesterol: 180 mg/dL (ref 0–200)
HDL: 37 mg/dL — ABNORMAL LOW (ref >39.00)
LDL Cholesterol: 124 mg/dL — ABNORMAL HIGH (ref 0–99)
NonHDL: 143.48
Total CHOL/HDL Ratio: 5
Triglycerides: 99 mg/dL (ref 0.0–149.0)
VLDL: 19.8 mg/dL (ref 0.0–40.0)

## 2018-11-30 LAB — MICROALBUMIN / CREATININE URINE RATIO
Creatinine,U: 137.1 mg/dL
Microalb Creat Ratio: 0.5 mg/g (ref 0.0–30.0)
Microalb, Ur: <0.7 mg/dL (ref 0.0–1.9)

## 2018-11-30 LAB — HEMOGLOBIN A1C: Hgb A1c MFr Bld: 9.1 % — ABNORMAL HIGH (ref 4.6–6.5)

## 2018-12-07 ENCOUNTER — Other Ambulatory Visit: Payer: Self-pay

## 2018-12-08 ENCOUNTER — Encounter: Payer: Self-pay | Admitting: Endocrinology

## 2018-12-08 ENCOUNTER — Other Ambulatory Visit: Payer: Self-pay

## 2018-12-08 ENCOUNTER — Ambulatory Visit (INDEPENDENT_AMBULATORY_CARE_PROVIDER_SITE_OTHER): Payer: PRIVATE HEALTH INSURANCE | Admitting: Endocrinology

## 2018-12-08 DIAGNOSIS — E1165 Type 2 diabetes mellitus with hyperglycemia: Secondary | ICD-10-CM | POA: Diagnosis not present

## 2018-12-08 DIAGNOSIS — Z794 Long term (current) use of insulin: Secondary | ICD-10-CM | POA: Diagnosis not present

## 2018-12-08 NOTE — Progress Notes (Signed)
Patient ID: Julie Berry, female   DOB: 1981-04-10, 38 y.o.   MRN: [id number]           Reason for Appointment: follow-up for Type 2 Diabetes  Referring physician: Gerarda Fraction   Today's office visit was provided via telemedicine using video technique Explained to the patient and the the limitations of evaluation and management by telemedicine and the availability of in person appointments.  The patient understood the limitations and agreed to proceed. Patient also understood that the telehealth visit is billable. . Location of the patient: Home . Location of the provider: Office Only the patient and myself were participating in the encounter     History of Present Illness:          Date of diagnosis of type 2 diabetes mellitus:   11/2013       Background history:  She thinks she had blood sugars of about 300 when she was first diagnosed; although she was complaining of some fatigue she was diagnosed with routine labs on her annual visit.  Her records indicate she had an A1c of 6.3 in 12/2011 She was started on metformin twice a day but she could not tolerate it well because of diarrhea.  She thinks she tried to take it for about 6 months with some improvement in her blood sugars, subsequently stopped.  Because she had persistently high blood sugars even with starting glipizide she was started on a regimen of Victoza and Jardiance on her initial consultation in 03/2015; her average blood sugar prior to this was 250 with baseline A1c 10.6   Recent history:        INSULIN regimen: Tresiba 74 units daily, Humulin R units 50-50--60 times daily before meals  Non-insulin hypoglycemic regimen: None, was on Bydureon 2 mg weekly  Her A1c is still high at 9.1, has been previously down to 7.3  Current blood sugar patterns from her glucose monitor download, current management and problems identified:  She is using a One Touch very meter and unable to download this today or get blood sugar  averages  Her blood sugars fluctuate significantly at all times  FASTING readings are mostly high with only one good reading about 10 days ago  Also blood sugars later in the day are inconsistent and difficult to know her patterns especially by reviewing her blood sugars on the phone by time of day  She had reported that she was having episodes of back pain and some weakness and she was correlating this with taking her Bydureon  She thinks she has not had any episodes since stopping this 2 weeks ago  Also she does not think she has had increased hunger or higher blood sugars with stopping Bydureon  She is trying to do a little walking and she thinks may have lost a couple of pounds  Her portions are still small  Also no overnight hypoglycemia   Side effects from medications have been: Metfomin causes diarrhea.  Candidiasis  from [place].  Nausea, palpitations from Trulicity ?  Back pain from Bydureon   Glucose monitoring:  1-2 times daily   Glucometer: One Touch Verio.       Blood Glucose readings from patient review of her meter:    PRE-MEAL Fasting Lunch Dinner Bedtime Overall  Glucose range: 146-221 91-286 141-162  131-236   Mean/median:        POST-MEAL PC Breakfast PC Lunch PC Dinner  Glucose range:    241-253  Mean/median:  Self-care: The diet that the patient has been following is: tries to limit sodas, fast food, fried foods .      Typical meal intake: Breakfast is mostly egg/ toast.  Usually eating grilled chicken at lunch and not always consistent with diet                Dietician visit, most recent: 6/15               Exercise: walking some recently  Weight history:  Wt Readings from Last 3 Encounters:  10/26/18 299 lb 3.2 oz (135.7 kg)  08/17/18 298 lb 9.6 oz (135.4 kg)  05/03/18 280 lb (127 kg)    Glycemic control:   Lab Results  Component Value Date   HGBA1C 9.1 (H) 11/30/2018   HGBA1C 8.3 (A) 08/17/2018   HGBA1C 9.0 (A)  02/20/2018   Lab Results  Component Value Date   MICROALBUR <0.7 11/30/2018   [place] 124 (H) 11/30/2018   CREATININE 0.94 11/30/2018    Other problems discussed today: See review of systems   Allergies as of 12/08/2018      Reactions   Amoxicillin Anaphylaxis, Other (See Comments)   Has patient had a PCN reaction causing immediate rash, facial/tongue/throat swelling, SOB or lightheadedness with hypotension: Yes Has patient had a PCN reaction causing severe rash involving mucus membranes or skin necrosis: No Has patient had a PCN reaction that required hospitalization No Has patient had a PCN reaction occurring within the last 10 years: No If all of the above answers are "NO", then may proceed with Cephalosporin use.   Penicillins Anaphylaxis, Other (See Comments)   Has patient had a PCN reaction causing immediate rash, facial/tongue/throat swelling, SOB or lightheadedness with hypotension: Yes Has patient had a PCN reaction causing severe rash involving mucus membranes or skin necrosis: No Has patient had a PCN reaction that required hospitalization No Has patient had a PCN reaction occurring within the last 10 years: No If all of the above answers are "NO", then may proceed with Cephalosporin use.   Dilaudid  [hydromorphone Hcl]    Contrast Media [iodinated Diagnostic Agents] Hives   CT Contrast, had hives for 3 days after cardiac CT   Latex Rash   Trulicity [dulaglutide] Nausea Only, Palpitations      Medication List       Accurate as of December 08, 2018  8:45 PM. Always use your most recent med list.        fluvastatin XL 80 MG 24 hr tablet Commonly known as:  LESCOL XL Take 1 tablet (80 mg total) by mouth at bedtime.   FreeStyle Libre 14 Day Sensor Misc 1 Units by Does not apply route every 14 (fourteen) days.   HumuLIN R U-500 KwikPen 500 UNIT/ML kwikpen Generic drug:  insulin regular human CONCENTRATED INJECT 45 UNITS AT BREAKFAST AND 35 UNITS AT LUNCH AND DINNER.    Tyler Aas FlexTouch 100 UNIT/ML Sopn FlexTouch Pen Generic drug:  insulin degludec Inject 64 Units into the skin daily. Inject 64 units under the skin once daily.       Allergies:  Allergies  Allergen Reactions  . Amoxicillin Anaphylaxis and Other (See Comments)    Has patient had a PCN reaction causing immediate rash, facial/tongue/throat swelling, SOB or lightheadedness with hypotension: Yes Has patient had a PCN reaction causing severe rash involving mucus membranes or skin necrosis: No Has patient had a PCN reaction that required hospitalization No Has patient had a PCN reaction occurring within  the last 10 years: No If all of the above answers are "NO", then may proceed with Cephalosporin use.  Marland Kitchen Penicillins Anaphylaxis and Other (See Comments)    Has patient had a PCN reaction causing immediate rash, facial/tongue/throat swelling, SOB or lightheadedness with hypotension: Yes Has patient had a PCN reaction causing severe rash involving mucus membranes or skin necrosis: No Has patient had a PCN reaction that required hospitalization No Has patient had a PCN reaction occurring within the last 10 years: No If all of the above answers are "NO", then may proceed with Cephalosporin use.  . Dilaudid  [Hydromorphone Hcl]   . Contrast Media [Iodinated Diagnostic Agents] Hives    CT Contrast, had hives for 3 days after cardiac CT  . Latex Rash  . Trulicity [Dulaglutide] Nausea Only and Palpitations    Past Medical History:  Diagnosis Date  . Complication of anesthesia    heartrate dropped with anesthesia  . Depressed   . Diabetes mellitus without complication ([place])   . Gallstones   . HTN (hypertension)   . Hyperglycemia   . Leukocytosis   . Low back pain   . Lump or mass in breast   . Malaise and fatigue   . Morbid obesity ([place])   . Sciatica   . Sleep apnea   . UTI (urinary tract infection)     Past Surgical History:  Procedure Laterality Date  . CHOLECYSTECTOMY   06/22/2012   Procedure: LAPAROSCOPIC CHOLECYSTECTOMY;  Surgeon: Jamesetta So, MD;  Location: [facility];  Service: General;  Laterality: N/A;  . ESOPHAGOGASTRODUODENOSCOPY N/A 06/09/2014   Procedure: ESOPHAGOGASTRODUODENOSCOPY (EGD);  Surgeon: Rogene Houston, MD;  Location: [facility];  Service: Endoscopy;  Laterality: N/A;  200-moved to 145 Ann to notify pt    Family History  Problem Relation Age of Onset  . Diabetes Mother   . Rheumatic fever Mother 52  . Migraines Father 50  . Hypertension Father   . Autism Brother   . Heart disease Neg Hx     Social History:  reports that she has never smoked. She has never used smokeless tobacco. She reports that she does not drink alcohol or use drugs.    Review of Systems    Lipid history:  has had muscle aches with pravastatin is being prescribed fluvastatin 80 mg However has not had adequate control with her last LDL still being high  She has conflicting history regarding taking her lovastatin and now states that she is taking it 4 days a week LDL has been consistently over 100  Lab Results  Component Value Date   CHOL 180 11/30/2018   HDL 37.00 (L) 11/30/2018   LDLCALC 124 (H) 11/30/2018   LDLDIRECT 112.0 07/19/2016   TRIG 99.0 11/30/2018   CHOLHDL 5 11/30/2018            She was recommended 5000 units of Vitamin D3 for vitamin D deficiency However has not taken this for some time and has not discussed this with her PCP   Lab Results  Component Value Date   VD25OH 23.05 (L) 07/19/2016   VD25OH 14.57 (L) 03/06/2016   Lab Results  Component Value Date   CALCIUM 9.2 11/30/2018       Physical Examination:  There were no vitals taken for this visit.     ASSESSMENT:  Diabetes type 2, uncontrolled with morbid obesity     See history of present illness for detailed discussion of current diabetes management, blood sugar  patterns and problems identified  Her A1c is higher at 9.1  She does not have enough blood  sugar readings to get a pattern and without a download difficult to identify blood sugar trends Most of her readings are fasting or before dinner As discussed above fasting readings are mostly high Also has variability in blood sugars the rest of the day partly because of irregular diet and variable carbohydrate intake  Although she reported nonspecific side effects like back pain and weakness the day after taking Bydureon this is unlikely to be related Currently does not appear to have any higher readings with stopping Bydureon and no problems with increased hunger As before she has not looked into the freestyle libre sensor Currently does have the Omni pod insulin pump and will need to be started on this once she is can get trained  HYPERCHOLESTEROLEMIA: She is taking Lescol but not daily has an LDL is still over 100 Encouraged her to try taking this daily and she agrees  PLAN:  Increase Tresiba up to 70 units Increase lunchtime Humulin R up to 70 units She will try to use the One Touch reveal app on her phone to get blood sugar patterns and allow linkage for download Encouraged her to increase exercise also Follow-up in 2 months  Otherwise follow-up in 6 weeks  There are no Patient Instructions on file for this visit.      Elayne Snare 12/08/2018, 8:45 PM   Note: This office note was prepared with Dragon voice recognition system technology. Any transcriptional errors that result from this process are unintentional.

## 2018-12-15 ENCOUNTER — Telehealth: Payer: Self-pay | Admitting: Nutrition
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
## 2018-12-22 ENCOUNTER — Other Ambulatory Visit: Payer: Self-pay | Admitting: Endocrinology

## 2018-12-24 ENCOUNTER — Other Ambulatory Visit: Payer: Self-pay | Admitting: Endocrinology

## 2018-12-29 ENCOUNTER — Other Ambulatory Visit: Payer: Self-pay | Admitting: Endocrinology

## 2019-01-26 ENCOUNTER — Telehealth: Payer: Self-pay | Admitting: Nutrition

## 2019-01-26 ENCOUNTER — Telehealth: Payer: Self-pay | Admitting: Endocrinology
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
## 2019-01-26 NOTE — Telephone Encounter (Signed)
MEDICATION: pen needles for Humulin R U-500 Kwikpen       pen needles for [employer] Flextouch  PHARMACY:  [facility] in [place] :  unknown  IS PATIENT OUT OF MEDICATION:   IF NOT; HOW MUCH IS LEFT:   LAST APPOINTMENT DATE: @5 /07/2019  NEXT APPOINTMENT DATE:@6 /15/2020  DO WE HAVE YOUR PERMISSION TO LEAVE A DETAILED MESSAGE: yes, [phone]  OTHER COMMENTS:    **Let patient know to contact pharmacy at the end of the day to make sure medication is ready. **  ** Please notify patient to allow 48-72 hours to process**  **Encourage patient to contact the pharmacy for refills or they can request refills through [facility]**

## 2019-01-27 ENCOUNTER — Other Ambulatory Visit: Payer: Self-pay

## 2019-01-27 MED ORDER — PEN NEEDLES 33G X 4 MM MISC: 1.0000 | 3 refills | Status: DC

## 2019-01-27 NOTE — Telephone Encounter (Signed)
Rx sent 

## 2019-01-31 ENCOUNTER — Other Ambulatory Visit: Payer: Self-pay | Admitting: Endocrinology

## 2019-01-31 DIAGNOSIS — E1165 Type 2 diabetes mellitus with hyperglycemia: Secondary | ICD-10-CM

## 2019-02-01 ENCOUNTER — Other Ambulatory Visit (INDEPENDENT_AMBULATORY_CARE_PROVIDER_SITE_OTHER): Payer: PRIVATE HEALTH INSURANCE

## 2019-02-01 ENCOUNTER — Other Ambulatory Visit: Payer: Self-pay

## 2019-02-01 DIAGNOSIS — E1165 Type 2 diabetes mellitus with hyperglycemia: Secondary | ICD-10-CM | POA: Diagnosis not present

## 2019-02-01 DIAGNOSIS — Z794 Long term (current) use of insulin: Secondary | ICD-10-CM | POA: Diagnosis not present

## 2019-02-01 LAB — BASIC METABOLIC PANEL
BUN: 17 mg/dL (ref 6–23)
CO2: 27 mEq/L (ref 19–32)
Calcium: 9 mg/dL (ref 8.4–10.5)
Chloride: 102 mEq/L (ref 96–112)
Creatinine, Ser: 0.91 mg/dL (ref 0.40–1.20)
GFR: 83.09 mL/min (ref >60.00)
Glucose, Bld: 206 mg/dL — ABNORMAL HIGH (ref 70–99)
Potassium: 3.6 mEq/L (ref 3.5–5.1)
Sodium: 137 mEq/L (ref 135–145)

## 2019-02-02 LAB — FRUCTOSAMINE: Fructosamine: 280 umol/L (ref 0–285)

## 2019-02-03 ENCOUNTER — Telehealth: Payer: Self-pay | Admitting: Endocrinology

## 2019-02-03 ENCOUNTER — Ambulatory Visit (INDEPENDENT_AMBULATORY_CARE_PROVIDER_SITE_OTHER): Payer: PRIVATE HEALTH INSURANCE | Admitting: Endocrinology

## 2019-02-03 ENCOUNTER — Encounter: Payer: Self-pay | Admitting: Endocrinology

## 2019-02-03 ENCOUNTER — Other Ambulatory Visit: Payer: Self-pay

## 2019-02-03 DIAGNOSIS — Z794 Long term (current) use of insulin: Secondary | ICD-10-CM

## 2019-02-03 DIAGNOSIS — E1165 Type 2 diabetes mellitus with hyperglycemia: Secondary | ICD-10-CM

## 2019-02-03 NOTE — Telephone Encounter (Signed)
Spoke with pt and got blood sugar readings in order to do virtual visit.

## 2019-02-03 NOTE — Telephone Encounter (Signed)
Patient is returning call to give glucose readings.  Please Advise,Thanks

## 2019-02-03 NOTE — Progress Notes (Signed)
Patient ID: Julie Berry, female   DOB: 05/05/1981, 38 y.o.   MRN: [id number]           Reason for Appointment: follow-up for Type 2 Diabetes  Referring physician: Gerarda Fraction   Today's office visit was provided via telemedicine using video technique Explained to the patient and the the limitations of evaluation and management by telemedicine and the availability of in person appointments.  The patient understood the limitations and agreed to proceed. Patient also understood that the telehealth visit is billable. . Location of the patient: Home . Location of the provider: Office Only the patient and myself were participating in the encounter     History of Present Illness:          Date of diagnosis of type 2 diabetes mellitus:   11/2013       Background history:  She thinks she had blood sugars of about 300 when she was first diagnosed; although she was complaining of some fatigue she was diagnosed with routine labs on her annual visit.  Her records indicate she had an A1c of 6.3 in 12/2011 She was started on metformin twice a day but she could not tolerate it well because of diarrhea.  She thinks she tried to take it for about 6 months with some improvement in her blood sugars, subsequently stopped.  Because she had persistently high blood sugars even with starting glipizide she was started on a regimen of Victoza and Jardiance on her initial consultation in 03/2015; her average blood sugar prior to this was 250 with baseline A1c 10.6   Recent history:        INSULIN regimen: Tresiba 70-74 units daily, Humulin R units 60-70--60 times daily before meals  Non-insulin hypoglycemic regimen: None, was on Bydureon 2 mg weekly  Her A1c is last high at 9.1, has been previously down to 7.3  However fructosamine is lower at 280  Current blood sugar patterns from her glucose monitor download, current management and problems identified:  She is using a freestyle libre in the last few days  but has not synchronized this for download  Despite trying to increase her insulin her blood sugars are not any better  FASTING readings are mostly over 200 now despite increasing her Tyler Aas on the last visit  Her blood sugars tend to be somewhat lower between 4-6 PM but not consistently  She now says that she has a decreased appetite and is also eating small and variable meals usually with less carbohydrate  With this her blood sugars postprandially at night also are not consistently high with only 2 high readings recently  No hypoglycemia with lowest blood sugar 108  Most of her blood sugars midday are still high  Currently not exercising as much and only occasional walking  She does not know she has lost any weight   Side effects from medications have been: Metfomin causes diarrhea.  Candidiasis  from [place].  Nausea, palpitations from Trulicity ?  Back pain from Bydureon   Glucose monitoring:  1-2 times daily   Glucometer: One Touch Verio.       Blood Glucose readings from patient review of her meter:    PRE-MEAL Fasting Lunch Dinner Bedtime Overall  Glucose range:  203-262  178-249  108-277  128-236   Mean/median:     201   POST-MEAL PC Breakfast PC Lunch PC Dinner  Glucose range:     Mean/median:      Previous readings:  PRE-MEAL Fasting  Lunch Dinner Bedtime Overall  Glucose range: 146-221 91-286 141-162  131-236   Mean/median:        POST-MEAL PC Breakfast PC Lunch PC Dinner  Glucose range:    241-253  Mean/median:        Self-care: The diet that the patient has been following is: tries to limit sodas, fast food, fried foods .      Typical meal intake: Breakfast is mostly egg/ toast.  Usually eating grilled chicken at lunch and not always consistent with diet                Dietician visit, most recent: 6/15                Weight history:  Wt Readings from Last 3 Encounters:  10/26/18 299 lb 3.2 oz (135.7 kg)  08/17/18 298 lb 9.6 oz (135.4 kg)   05/03/18 280 lb (127 kg)    Glycemic control:   Lab Results  Component Value Date   HGBA1C 9.1 (H) 11/30/2018   HGBA1C 8.3 (A) 08/17/2018   HGBA1C 9.0 (A) 02/20/2018   Lab Results  Component Value Date   MICROALBUR <0.7 11/30/2018   [place] 124 (H) 11/30/2018   CREATININE 0.91 02/01/2019    Other problems discussed today: See review of systems   Allergies as of 02/03/2019      Reactions   Amoxicillin Anaphylaxis, Other (See Comments)   Has patient had a PCN reaction causing immediate rash, facial/tongue/throat swelling, SOB or lightheadedness with hypotension: Yes Has patient had a PCN reaction causing severe rash involving mucus membranes or skin necrosis: No Has patient had a PCN reaction that required hospitalization No Has patient had a PCN reaction occurring within the last 10 years: No If all of the above answers are "NO", then may proceed with Cephalosporin use.   Penicillins Anaphylaxis, Other (See Comments)   Has patient had a PCN reaction causing immediate rash, facial/tongue/throat swelling, SOB or lightheadedness with hypotension: Yes Has patient had a PCN reaction causing severe rash involving mucus membranes or skin necrosis: No Has patient had a PCN reaction that required hospitalization No Has patient had a PCN reaction occurring within the last 10 years: No If all of the above answers are "NO", then may proceed with Cephalosporin use.   Dilaudid  [hydromorphone Hcl]    Contrast Media [iodinated Diagnostic Agents] Hives   CT Contrast, had hives for 3 days after cardiac CT   Latex Rash   Trulicity [dulaglutide] Nausea Only, Palpitations      Medication List       Accurate as of February 03, 2019  2:55 PM. If you have any questions, ask your nurse or doctor.        STOP taking these medications   Bydureon BCise 2 MG/0.85ML Auij Generic drug: Exenatide ER Stopped by: Elayne Snare, MD     TAKE these medications   fluvastatin XL 80 MG 24 hr tablet  Commonly known as: LESCOL XL Take 1 tablet (80 mg total) by mouth at bedtime.   FreeStyle Libre 14 Day Sensor Misc 1 Units by Does not apply route every 14 (fourteen) days.   HumuLIN R U-500 KwikPen 500 UNIT/ML kwikpen Generic drug: insulin regular human CONCENTRATED INJECT 45 UNITS AT BREAKFAST AND 35 UNITS AT LUNCH AND DINNER.   Pen Needles 33G X 4 MM Misc 1 each by Other route See admin instructions. Use 1 pen needle to inject insulin 4 times daily.   Tyler Aas FlexTouch 100  UNIT/ML Sopn FlexTouch Pen Generic drug: insulin degludec Inject 64 Units into the skin daily. Inject 64 units under the skin once daily.       Allergies:  Allergies  Allergen Reactions  . Amoxicillin Anaphylaxis and Other (See Comments)    Has patient had a PCN reaction causing immediate rash, facial/tongue/throat swelling, SOB or lightheadedness with hypotension: Yes Has patient had a PCN reaction causing severe rash involving mucus membranes or skin necrosis: No Has patient had a PCN reaction that required hospitalization No Has patient had a PCN reaction occurring within the last 10 years: No If all of the above answers are "NO", then may proceed with Cephalosporin use.  Marland Kitchen Penicillins Anaphylaxis and Other (See Comments)    Has patient had a PCN reaction causing immediate rash, facial/tongue/throat swelling, SOB or lightheadedness with hypotension: Yes Has patient had a PCN reaction causing severe rash involving mucus membranes or skin necrosis: No Has patient had a PCN reaction that required hospitalization No Has patient had a PCN reaction occurring within the last 10 years: No If all of the above answers are "NO", then may proceed with Cephalosporin use.  . Dilaudid  [Hydromorphone Hcl]   . Contrast Media [Iodinated Diagnostic Agents] Hives    CT Contrast, had hives for 3 days after cardiac CT  . Latex Rash  . Trulicity [Dulaglutide] Nausea Only and Palpitations    Past Medical History:   Diagnosis Date  . Complication of anesthesia    heartrate dropped with anesthesia  . Depressed   . Diabetes mellitus without complication ([place])   . Gallstones   . HTN (hypertension)   . Hyperglycemia   . Leukocytosis   . Low back pain   . Lump or mass in breast   . Malaise and fatigue   . Morbid obesity ([place])   . Sciatica   . Sleep apnea   . UTI (urinary tract infection)     Past Surgical History:  Procedure Laterality Date  . CHOLECYSTECTOMY  06/22/2012   Procedure: LAPAROSCOPIC CHOLECYSTECTOMY;  Surgeon: Jamesetta So, MD;  Location: [facility];  Service: General;  Laterality: N/A;  . ESOPHAGOGASTRODUODENOSCOPY N/A 06/09/2014   Procedure: ESOPHAGOGASTRODUODENOSCOPY (EGD);  Surgeon: Rogene Houston, MD;  Location: [facility];  Service: Endoscopy;  Laterality: N/A;  200-moved to 145 Ann to notify pt    Family History  Problem Relation Age of Onset  . Diabetes Mother   . Rheumatic fever Mother 84  . Migraines Father 46  . Hypertension Father   . Autism Brother   . Heart disease Neg Hx     Social History:  reports that she has never smoked. She has never used smokeless tobacco. She reports that she does not drink alcohol or use drugs.    Review of Systems    Lipid history:  has had muscle aches with pravastatin is being prescribed fluvastatin 80 mg However has not had adequate control  She has usually been irregular with taking her Lescol  LDL has been consistently over 100  Lab Results  Component Value Date   CHOL 180 11/30/2018   HDL 37.00 (L) 11/30/2018   LDLCALC 124 (H) 11/30/2018   LDLDIRECT 112.0 07/19/2016   TRIG 99.0 11/30/2018   CHOLHDL 5 11/30/2018            She was recommended 5000 units of Vitamin D3 for vitamin D deficiency Followed by PCP   Lab Results  Component Value Date   VD25OH 23.05 (L) 07/19/2016  VD25OH 14.57 (L) 03/06/2016   Lab Results  Component Value Date   CALCIUM 9.0 02/01/2019    She is having decreased appetite  and she will wake up nauseated in the morning She says that she was given a tablet for this previously to take 30-minute before eating but this caused constipation and she is not taking this and has not followed up with her gastroenterologist   Physical Examination:  There were no vitals taken for this visit.     ASSESSMENT:  Diabetes type 2, uncontrolled with morbid obesity     See history of present illness for detailed discussion of current diabetes management, blood sugar patterns and problems identified  Her A1c was last 9.1  However fructosamine of 280 indicates somewhat better readings  She is on insulin alone Although her blood sugars are fluctuating during the day based on her food intake she is showing consistently high readings before breakfast However unable to review her freestyle libre with a download today to enable better pattern assessment  She has been reluctant to take GLP-1 drugs, SGLT2 drugs and metformin because of side effects of various kinds She may be a good candidate for Ozempic but appears to have symptoms suggestive of gastroparesis currently  Although she does have the Omni pod insulin pump she wants to wait until it safe to come into the office for longer visits  Labs and blood sugar readings reviewed  HYPERCHOLESTEROLEMIA: She is taking Lescol and will need follow-up labs   PLAN:  Trial of 20 units of Humulin R at bedtime in addition Take consistent amount of 74 Tresiba in the morning Will not change her other insulin doses since her blood sugars during the day are variable and depending on the portions that she has eaten Adjustment may need to be done based on her intake and try postprandial injections on the next visit  She will contact her gastroenterologist to see if she can have her nausea and decreased appetite resolved May consider Ozempic if she has control of her nausea  Regular walking Follow-up in 6 weeks  There are no Patient  Instructions on file for this visit.  Counseling time on subjects discussed in assessment and plan sections is over 50% of today's 25 minute total encounter   Elayne Snare 02/03/2019, 2:55 PM   Note: This office note was prepared with Dragon voice recognition system technology. Any transcriptional errors that result from this process are unintentional.

## 2019-02-10 ENCOUNTER — Other Ambulatory Visit: Payer: Self-pay | Admitting: Internal Medicine

## 2019-02-10 ENCOUNTER — Other Ambulatory Visit: Payer: Self-pay

## 2019-02-10 DIAGNOSIS — Z20822 Contact with and (suspected) exposure to covid-19: Secondary | ICD-10-CM

## 2019-02-15 LAB — NOVEL CORONAVIRUS, NAA: SARS-CoV-2, NAA: NOT DETECTED

## 2019-02-16 ENCOUNTER — Ambulatory Visit: Payer: PRIVATE HEALTH INSURANCE | Admitting: Cardiovascular Disease

## 2019-02-24 ENCOUNTER — Other Ambulatory Visit: Payer: Self-pay

## 2019-02-24 ENCOUNTER — Telehealth: Payer: Self-pay | Admitting: Endocrinology

## 2019-02-24 NOTE — Telephone Encounter (Signed)
Please see blood sugar log that has been placed in your to be signed folder.

## 2019-02-24 NOTE — Telephone Encounter (Signed)
2 weeks worth of blood sugars were given to me by the patient and will give the MD for review.

## 2019-02-24 NOTE — Telephone Encounter (Signed)
Patient stated Dr.Kumar informed her to call in with 2 weeks of her readings for him.  Please Advise, Thanks

## 2019-02-24 NOTE — Telephone Encounter (Signed)
If she is taking 70 units of the Humulin R at lunchtime she will increase it to 80 Also increase the Humulin R U-500 from 20 units to 25 units at bedtime

## 2019-02-24 NOTE — Telephone Encounter (Signed)
Called pt and gave her MD message. Pt stated that she has been taking Humulin R U-500, 70 units at breakfast, 70 lunch, and 70 at dinner.  Will now increase to 70 in am, 80 at lunch, 70 at dinner and 25 units at bedtime.  Is this what you want?

## 2019-02-24 NOTE — Telephone Encounter (Signed)
Yes, 70 in am, 80 at lunch, 70 at dinner and 25 units at bedtime.

## 2019-02-25 ENCOUNTER — Other Ambulatory Visit: Payer: Self-pay

## 2019-02-25 NOTE — Telephone Encounter (Signed)
Noted. This is the dosing instructions the pt was given.

## 2019-03-02 ENCOUNTER — Other Ambulatory Visit: Payer: Self-pay | Admitting: Endocrinology

## 2019-03-02 ENCOUNTER — Ambulatory Visit: Payer: PRIVATE HEALTH INSURANCE | Admitting: Student
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
## 2019-03-05 ENCOUNTER — Encounter: Payer: Self-pay | Admitting: Student

## 2019-03-07 ENCOUNTER — Other Ambulatory Visit: Payer: Self-pay | Admitting: Endocrinology

## 2019-03-11 IMAGING — DX DG CHEST 2V
2 series · 2 of 2 positions shown · non-contrast
Comparison: 10/06/2017.

CLINICAL DATA: Chest pain and shortness of breath. Palpitations and
dizziness.

EXAM:
CHEST - 2 VIEW

[chest pa]
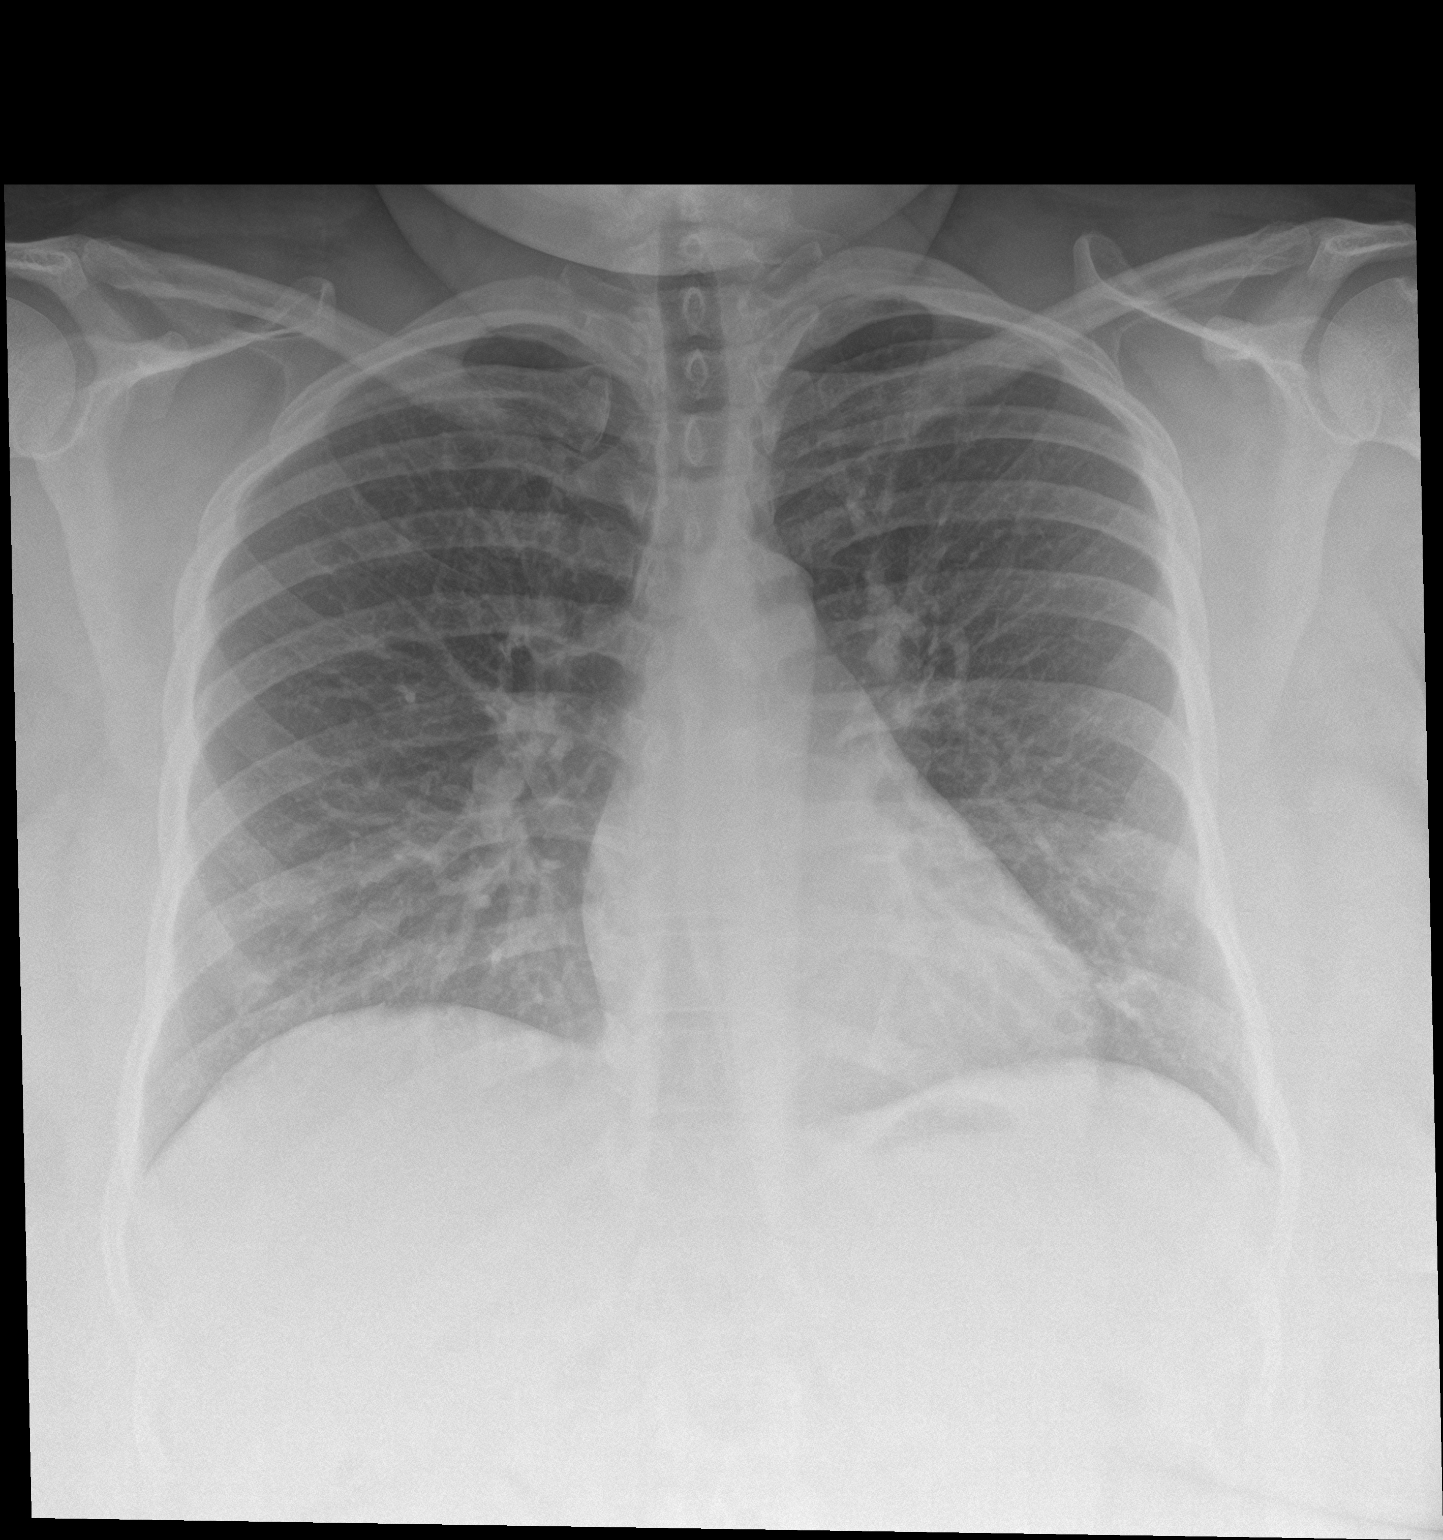

[chest lat]
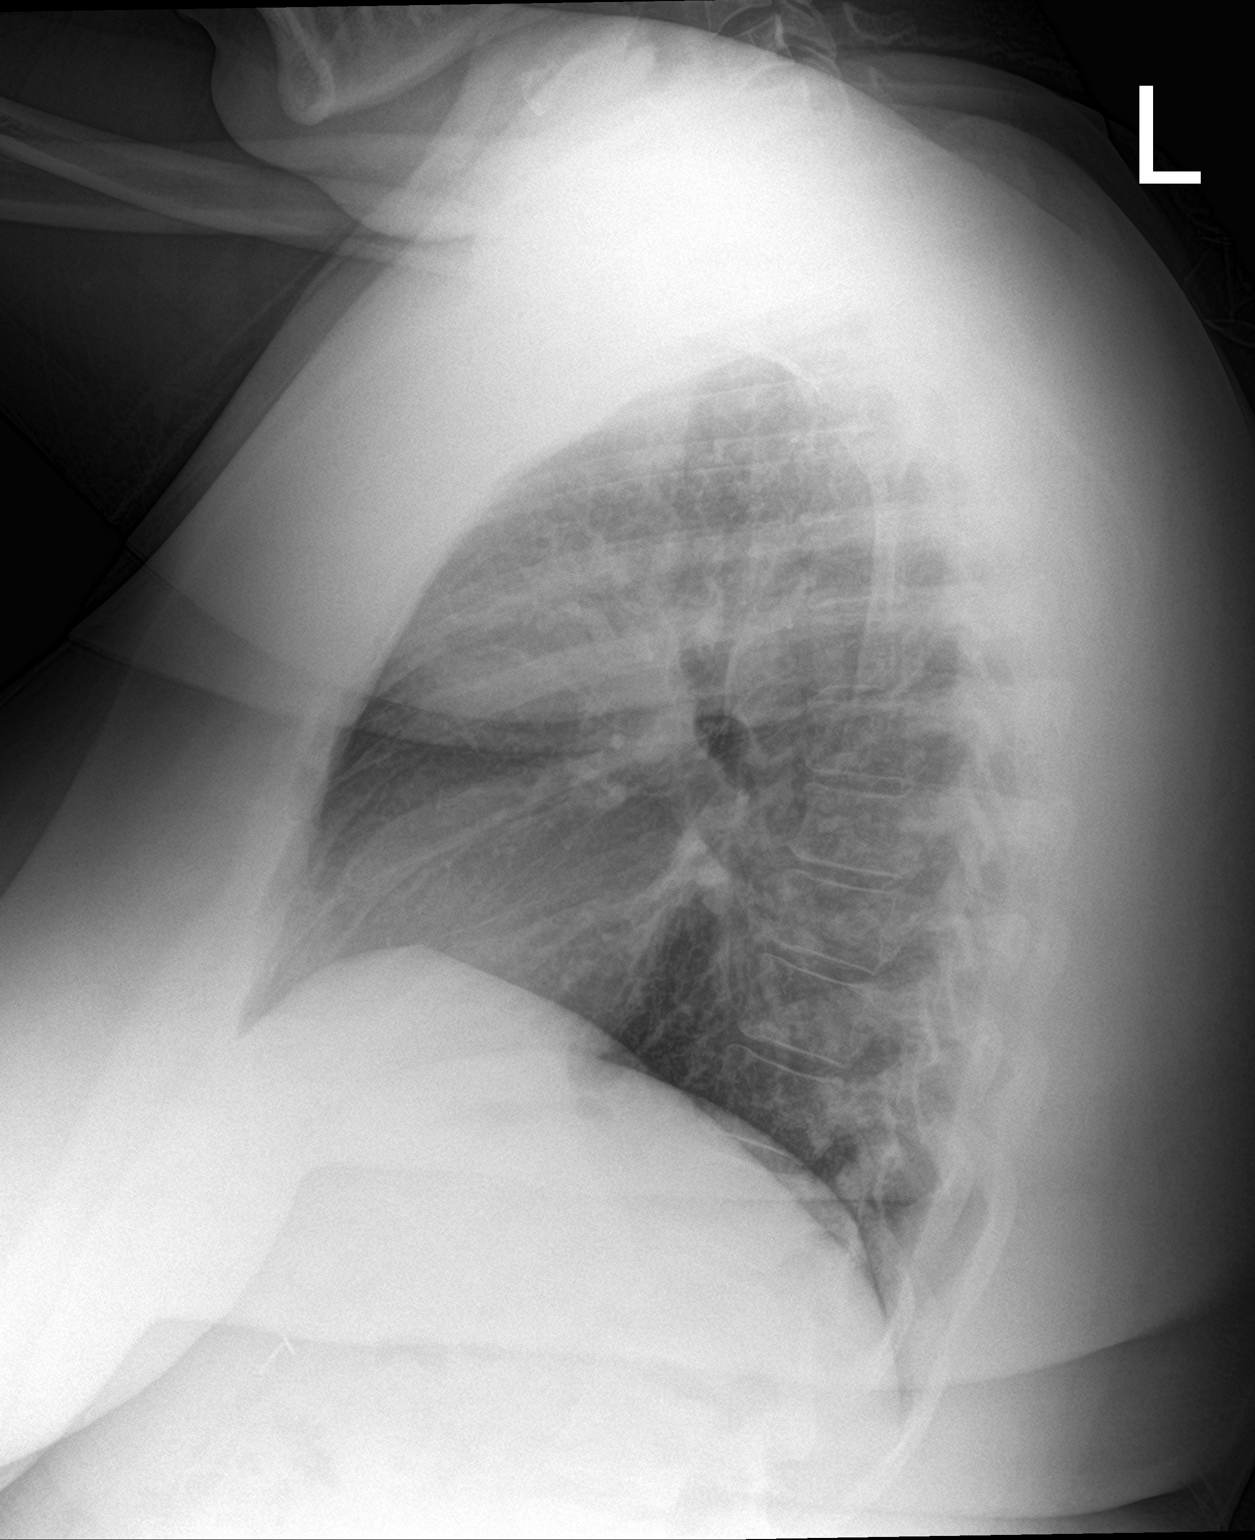

[2 of 2 positions shown; findings below may reference images not displayed]

FINDINGS: The heart size and mediastinal contours are within normal limits.
Both lungs are clear. The visualized skeletal structures are
unremarkable.
IMPRESSION: No active cardiopulmonary disease.  Stable exam.

## 2019-03-14 ENCOUNTER — Other Ambulatory Visit: Payer: Self-pay | Admitting: Endocrinology

## 2019-03-14 DIAGNOSIS — Z794 Long term (current) use of insulin: Secondary | ICD-10-CM

## 2019-03-14 DIAGNOSIS — E559 Vitamin D deficiency, unspecified: Secondary | ICD-10-CM

## 2019-03-14 DIAGNOSIS — E1165 Type 2 diabetes mellitus with hyperglycemia: Secondary | ICD-10-CM

## 2019-03-14 DIAGNOSIS — E7800 Pure hypercholesterolemia, unspecified: Secondary | ICD-10-CM

## 2019-03-15 ENCOUNTER — Other Ambulatory Visit: Payer: PRIVATE HEALTH INSURANCE

## 2019-03-17 ENCOUNTER — Ambulatory Visit: Payer: PRIVATE HEALTH INSURANCE | Admitting: Endocrinology

## 2019-03-19 ENCOUNTER — Other Ambulatory Visit: Payer: PRIVATE HEALTH INSURANCE

## 2019-03-22 ENCOUNTER — Other Ambulatory Visit: Payer: Self-pay

## 2019-03-22 ENCOUNTER — Other Ambulatory Visit (INDEPENDENT_AMBULATORY_CARE_PROVIDER_SITE_OTHER): Payer: PRIVATE HEALTH INSURANCE

## 2019-03-22 DIAGNOSIS — E1165 Type 2 diabetes mellitus with hyperglycemia: Secondary | ICD-10-CM | POA: Diagnosis not present

## 2019-03-22 DIAGNOSIS — E559 Vitamin D deficiency, unspecified: Secondary | ICD-10-CM

## 2019-03-22 DIAGNOSIS — Z794 Long term (current) use of insulin: Secondary | ICD-10-CM

## 2019-03-22 DIAGNOSIS — E7800 Pure hypercholesterolemia, unspecified: Secondary | ICD-10-CM

## 2019-03-22 LAB — COMPREHENSIVE METABOLIC PANEL
ALT: 16 U/L (ref 0–35)
AST: 15 U/L (ref 0–37)
Albumin: 3.6 g/dL (ref 3.5–5.2)
Alkaline Phosphatase: 54 U/L (ref 39–117)
BUN: 19 mg/dL (ref 6–23)
CO2: 27 mEq/L (ref 19–32)
Calcium: 8.8 mg/dL (ref 8.4–10.5)
Chloride: 102 mEq/L (ref 96–112)
Creatinine, Ser: 1 mg/dL (ref 0.40–1.20)
GFR: 74.47 mL/min (ref >60.00)
Glucose, Bld: 152 mg/dL — ABNORMAL HIGH (ref 70–99)
Potassium: 3.7 mEq/L (ref 3.5–5.1)
Sodium: 137 mEq/L (ref 135–145)
Total Bilirubin: 0.3 mg/dL (ref 0.2–1.2)
Total Protein: 6.8 g/dL (ref 6.0–8.3)

## 2019-03-22 LAB — LIPID PANEL
Cholesterol: 165 mg/dL (ref 0–200)
HDL: 34.9 mg/dL — ABNORMAL LOW (ref >39.00)
LDL Cholesterol: 113 mg/dL — ABNORMAL HIGH (ref 0–99)
NonHDL: 129.62
Total CHOL/HDL Ratio: 5
Triglycerides: 85 mg/dL (ref 0.0–149.0)
VLDL: 17 mg/dL (ref 0.0–40.0)

## 2019-03-22 LAB — VITAMIN D 25 HYDROXY (VIT D DEFICIENCY, FRACTURES): VITD: 25.67 ng/mL — ABNORMAL LOW (ref 30.00–100.00)

## 2019-03-22 LAB — HEMOGLOBIN A1C: Hgb A1c MFr Bld: 8.7 % — ABNORMAL HIGH (ref 4.6–6.5)

## 2019-03-25 NOTE — Progress Notes (Deleted)
Patient ID: Julie Berry, female   DOB: Feb 13, 1981, 38 y.o.   MRN: [id number]           Reason for Appointment: follow-up for Type 2 Diabetes  Referring physician: Gerarda Fraction   Today's office visit was provided via telemedicine using video technique Explained to the patient and the the limitations of evaluation and management by telemedicine and the availability of in person appointments.  The patient understood the limitations and agreed to proceed. Patient also understood that the telehealth visit is billable. . Location of the patient: Home . Location of the provider: Office Only the patient and myself were participating in the encounter     History of Present Illness:          Date of diagnosis of type 2 diabetes mellitus:   11/2013       Background history:  She thinks she had blood sugars of about 300 when she was first diagnosed; although she was complaining of some fatigue she was diagnosed with routine labs on her annual visit.  Her records indicate she had an A1c of 6.3 in 12/2011 She was started on metformin twice a day but she could not tolerate it well because of diarrhea.  She thinks she tried to take it for about 6 months with some improvement in her blood sugars, subsequently stopped.  Because she had persistently high blood sugars even with starting glipizide she was started on a regimen of Victoza and Jardiance on her initial consultation in 03/2015; her average blood sugar prior to this was 250 with baseline A1c 10.6   Recent history:        INSULIN regimen: Tresiba 70-74 units daily, Humulin R 70 in am, 80 at lunch, 70 at dinner and 25 units at bedtime.   60-70--60 times daily before meals  Non-insulin hypoglycemic regimen: None, was on Bydureon 2 mg weekly  Her A1c is last high at 9.1, has been previously down to 7.3  However fructosamine is lower at 280  Current blood sugar patterns from her glucose monitor download, current management and problems  identified:  She is using a freestyle libre in the last few days but has not synchronized this for download  Despite trying to increase her insulin her blood sugars are not any better  FASTING readings are mostly over 200 now despite increasing her Tyler Aas on the last visit  Her blood sugars tend to be somewhat lower between 4-6 PM but not consistently  She now says that she has a decreased appetite and is also eating small and variable meals usually with less carbohydrate  With this her blood sugars postprandially at night also are not consistently high with only 2 high readings recently  No hypoglycemia with lowest blood sugar 108  Most of her blood sugars midday are still high  Currently not exercising as much and only occasional walking  She does not know she has lost any weight   Side effects from medications have been: Metfomin causes diarrhea.  Candidiasis  from [place].  Nausea, palpitations from Trulicity ?  Back pain from Bydureon   Glucose monitoring:  1-2 times daily   Glucometer: One Touch Verio.       Blood Glucose readings from patient review of her meter:    PRE-MEAL Fasting Lunch Dinner Bedtime Overall  Glucose range:  203-262  178-249  108-277  128-236   Mean/median:     201   POST-MEAL PC Breakfast PC Lunch PC Dinner  Glucose range:  Mean/median:      Previous readings:  PRE-MEAL Fasting Lunch Dinner Bedtime Overall  Glucose range: 146-221 91-286 141-162  131-236   Mean/median:        POST-MEAL PC Breakfast PC Lunch PC Dinner  Glucose range:    241-253  Mean/median:        Self-care: The diet that the patient has been following is: tries to limit sodas, fast food, fried foods .      Typical meal intake: Breakfast is mostly egg/ toast.  Usually eating grilled chicken at lunch and not always consistent with diet                Dietician visit, most recent: 6/15                Weight history:  Wt Readings from Last 3 Encounters:   10/26/18 299 lb 3.2 oz (135.7 kg)  08/17/18 298 lb 9.6 oz (135.4 kg)  05/03/18 280 lb (127 kg)    Glycemic control:   Lab Results  Component Value Date   HGBA1C 8.7 (H) 03/22/2019   HGBA1C 9.1 (H) 11/30/2018   HGBA1C 8.3 (A) 08/17/2018   Lab Results  Component Value Date   MICROALBUR <0.7 11/30/2018   [place] 113 (H) 03/22/2019   CREATININE 1.00 03/22/2019    Other problems discussed today: See review of systems   Allergies as of 03/26/2019      Reactions   Amoxicillin Anaphylaxis, Other (See Comments)   Has patient had a PCN reaction causing immediate rash, facial/tongue/throat swelling, SOB or lightheadedness with hypotension: Yes Has patient had a PCN reaction causing severe rash involving mucus membranes or skin necrosis: No Has patient had a PCN reaction that required hospitalization No Has patient had a PCN reaction occurring within the last 10 years: No If all of the above answers are "NO", then may proceed with Cephalosporin use.   Penicillins Anaphylaxis, Other (See Comments)   Has patient had a PCN reaction causing immediate rash, facial/tongue/throat swelling, SOB or lightheadedness with hypotension: Yes Has patient had a PCN reaction causing severe rash involving mucus membranes or skin necrosis: No Has patient had a PCN reaction that required hospitalization No Has patient had a PCN reaction occurring within the last 10 years: No If all of the above answers are "NO", then may proceed with Cephalosporin use.   Dilaudid  [hydromorphone Hcl]    Contrast Media [iodinated Diagnostic Agents] Hives   CT Contrast, had hives for 3 days after cardiac CT   Latex Rash   Trulicity [dulaglutide] Nausea Only, Palpitations      Medication List       Accurate as of March 25, 2019  8:47 PM. If you have any questions, ask your nurse or doctor.        fluvastatin XL 80 MG 24 hr tablet Commonly known as: LESCOL XL Take 1 tablet (80 mg total) by mouth at bedtime.    FreeStyle Libre 14 Day Sensor Misc 1 Units by Does not apply route every 14 (fourteen) days.   HumuLIN R U-500 KwikPen 500 UNIT/ML kwikpen Generic drug: insulin regular human CONCENTRATED INJECT 45 UNITS AT BREAKFAST AND 35 UNITS AT LUNCH AND DINNER. What changed: See the new instructions.   Pen Needles 33G X 4 MM Misc 1 each by Other route See admin instructions. Use 1 pen needle to inject insulin 4 times daily.   Tyler Aas FlexTouch 100 UNIT/ML Sopn FlexTouch Pen Generic drug: insulin degludec INJECT 54 UNITS INTO  THE SKIN DAILY. What changed: See the new instructions.       Allergies:  Allergies  Allergen Reactions  . Amoxicillin Anaphylaxis and Other (See Comments)    Has patient had a PCN reaction causing immediate rash, facial/tongue/throat swelling, SOB or lightheadedness with hypotension: Yes Has patient had a PCN reaction causing severe rash involving mucus membranes or skin necrosis: No Has patient had a PCN reaction that required hospitalization No Has patient had a PCN reaction occurring within the last 10 years: No If all of the above answers are "NO", then may proceed with Cephalosporin use.  Marland Kitchen Penicillins Anaphylaxis and Other (See Comments)    Has patient had a PCN reaction causing immediate rash, facial/tongue/throat swelling, SOB or lightheadedness with hypotension: Yes Has patient had a PCN reaction causing severe rash involving mucus membranes or skin necrosis: No Has patient had a PCN reaction that required hospitalization No Has patient had a PCN reaction occurring within the last 10 years: No If all of the above answers are "NO", then may proceed with Cephalosporin use.  . Dilaudid  [Hydromorphone Hcl]   . Contrast Media [Iodinated Diagnostic Agents] Hives    CT Contrast, had hives for 3 days after cardiac CT  . Latex Rash  . Trulicity [Dulaglutide] Nausea Only and Palpitations    Past Medical History:  Diagnosis Date  . Complication of anesthesia     heartrate dropped with anesthesia  . Depressed   . Diabetes mellitus without complication ([place])   . Gallstones   . HTN (hypertension)   . Hyperglycemia   . Leukocytosis   . Low back pain   . Lump or mass in breast   . Malaise and fatigue   . Morbid obesity ([place])   . Sciatica   . Sleep apnea   . UTI (urinary tract infection)     Past Surgical History:  Procedure Laterality Date  . CHOLECYSTECTOMY  06/22/2012   Procedure: LAPAROSCOPIC CHOLECYSTECTOMY;  Surgeon: Jamesetta So, MD;  Location: [facility];  Service: General;  Laterality: N/A;  . ESOPHAGOGASTRODUODENOSCOPY N/A 06/09/2014   Procedure: ESOPHAGOGASTRODUODENOSCOPY (EGD);  Surgeon: Rogene Houston, MD;  Location: [facility];  Service: Endoscopy;  Laterality: N/A;  200-moved to 145 Ann to notify pt    Family History  Problem Relation Age of Onset  . Diabetes Mother   . Rheumatic fever Mother 55  . Migraines Father 8  . Hypertension Father   . Autism Brother   . Heart disease Neg Hx     Social History:  reports that she has never smoked. She has never used smokeless tobacco. She reports that she does not drink alcohol or use drugs.    Review of Systems    Lipid history:  has had muscle aches with pravastatin is being prescribed fluvastatin 80 mg However has not had adequate control  She has usually been irregular with taking her Lescol  LDL has been consistently over 100  Lab Results  Component Value Date   CHOL 165 03/22/2019   HDL 34.90 (L) 03/22/2019   LDLCALC 113 (H) 03/22/2019   LDLDIRECT 112.0 07/19/2016   TRIG 85.0 03/22/2019   CHOLHDL 5 03/22/2019            She was recommended 5000 units of Vitamin D3 for vitamin D deficiency Followed by PCP   Lab Results  Component Value Date   VD25OH 25.67 (L) 03/22/2019   VD25OH 23.05 (L) 07/19/2016   Lab Results  Component Value Date  CALCIUM 8.8 03/22/2019    She is having decreased appetite and she will wake up nauseated in the morning She  says that she was given a tablet for this previously to take 30-minute before eating but this caused constipation and she is not taking this and has not followed up with her gastroenterologist   Physical Examination:  There were no vitals taken for this visit.     ASSESSMENT:  Diabetes type 2, uncontrolled with morbid obesity     See history of present illness for detailed discussion of current diabetes management, blood sugar patterns and problems identified  Her A1c was last 9.1  However fructosamine of 280 indicates somewhat better readings  She is on insulin alone Although her blood sugars are fluctuating during the day based on her food intake she is showing consistently high readings before breakfast However unable to review her freestyle libre with a download today to enable better pattern assessment  She has been reluctant to take GLP-1 drugs, SGLT2 drugs and metformin because of side effects of various kinds She may be a good candidate for Ozempic but appears to have symptoms suggestive of gastroparesis currently  Although she does have the Omni pod insulin pump she wants to wait until it safe to come into the office for longer visits  Labs and blood sugar readings reviewed  HYPERCHOLESTEROLEMIA: She is taking Lescol and will need follow-up labs   PLAN:  Trial of 20 units of Humulin R at bedtime in addition Take consistent amount of 74 Tresiba in the morning Will not change her other insulin doses since her blood sugars during the day are variable and depending on the portions that she has eaten Adjustment may need to be done based on her intake and try postprandial injections on the next visit  She will contact her gastroenterologist to see if she can have her nausea and decreased appetite resolved May consider Ozempic if she has control of her nausea  Regular walking Follow-up in 6 weeks  There are no Patient Instructions on file for this visit.  Counseling  time on subjects discussed in assessment and plan sections is over 50% of today's 25 minute total encounter   Elayne Snare 03/25/2019, 8:47 PM   Note: This office note was prepared with Dragon voice recognition system technology. Any transcriptional errors that result from this process are unintentional.

## 2019-03-26 ENCOUNTER — Ambulatory Visit: Payer: PRIVATE HEALTH INSURANCE | Admitting: Endocrinology

## 2019-03-26 ENCOUNTER — Encounter: Payer: Self-pay | Admitting: Endocrinology

## 2019-03-26 ENCOUNTER — Ambulatory Visit (INDEPENDENT_AMBULATORY_CARE_PROVIDER_SITE_OTHER): Payer: PRIVATE HEALTH INSURANCE | Admitting: Endocrinology

## 2019-03-26 ENCOUNTER — Other Ambulatory Visit: Payer: Self-pay

## 2019-03-26 DIAGNOSIS — Z794 Long term (current) use of insulin: Secondary | ICD-10-CM | POA: Diagnosis not present

## 2019-03-26 DIAGNOSIS — E1165 Type 2 diabetes mellitus with hyperglycemia: Secondary | ICD-10-CM | POA: Diagnosis not present

## 2019-03-26 DIAGNOSIS — E782 Mixed hyperlipidemia: Secondary | ICD-10-CM | POA: Diagnosis not present

## 2019-03-26 NOTE — Progress Notes (Signed)
Patient ID: Julie Berry, female   DOB: Jun 21, 1981, 38 y.o.   MRN: [id number]           Reason for Appointment: follow-up for Type 2 Diabetes  Referring physician: Gerarda Fraction  Today's office visit was provided via telemedicine using video technique The patient was explained the limitations of evaluation and management by telemedicine and the availability of in person appointments.  The patient understood the limitations and agreed to proceed. Patient also understood that the telehealth visit is billable. . Location of the patient: Patient's home . Location of the provider: Physician office Only the patient and myself were participating in the encounter   History of Present Illness:          Date of diagnosis of type 2 diabetes mellitus:   11/2013       Background history:  She thinks she had blood sugars of about 300 when she was first diagnosed; although she was complaining of some fatigue she was diagnosed with routine labs on her annual visit.  Her records indicate she had an A1c of 6.3 in 12/2011 She was started on metformin twice a day but she could not tolerate it well because of diarrhea.  She thinks she tried to take it for about 6 months with some improvement in her blood sugars, subsequently stopped.  Because she had persistently high blood sugars even with starting glipizide she was started on a regimen of Victoza and Jardiance on her initial consultation in 03/2015; her average blood sugar prior to this was 250 with baseline A1c 10.6   Recent history:        INSULIN regimen: Tresiba 64 units daily, Humulin R units 70 breakfast, 80 with lunch and 70 dinner, also 20 units at bedtime  Non-insulin hypoglycemic regimen: None, was on Bydureon 2 mg weekly  Her A1c is now still high at 8.7, previously 9.1, has been in the past down to 7.3   Current blood sugar patterns from her glucose monitor download, current management and problems identified:  She is using a freestyle libre  since about June and finally was able to have her information uploaded  Blood sugar patterns, day-to-day management are reviewed in detail below in the CGM interpretation  Overall she has started trying to do better with her diet with cutting back on carbohydrates like bread, high-fat foods and snacks and increasing vegetables  She is also for the last 6 weeks walking 3 times a week  She thinks she may have lost a little weight also but not sure Her insulin doses have been periodically adjusted in between visits with communications on phone or my chart  Side effects from medications have been: Metfomin causes diarrhea.  Candidiasis  from [place].  Nausea, palpitations from Trulicity ?  Back pain from Bydureon   Glucose monitoring:  1-2 times daily    CONTINUOUS GLUCOSE MONITORING RECORD INTERPRETATION    Dates of Recording:   Sensor description:  Results statistics:   CGM use % of time  49  Average and SD  155, GV 34.6  Time in range       65 %  % Time Above 180  26  % Time above 250 5  % Time Below target 4    Glycemic patterns summary:   Hyperglycemic patterns are seen either between 6 AM and about 2 PM and then again inconsistently later in the evening Blood sugars start rising at around 6 AM before breakfast even and now progressively higher  until about 2 PM with mild variability Blood sugars in the afternoons are quite variable and periodically over 200 Only occasionally sugars are relatively higher on midnight  Hypoglycemic episodes have been minimal with occasional low normal sugars around 2-3 AM and once around 8-9 AM.  May be mildly symptomatic also  Overnight periods: Blood sugars are very variable especially around midnight but generally steady between 2 AM-6 AM with average sugar in the 120s and only once documented hypoglycemia which was mild  Preprandial periods: Blood sugars are rising prior to breakfast and averaging about 150 LUNCH: Blood sugars are  usually starting of higher around 180-190  Dinnertime blood sugars are quite variable but generally trending higher and averaging 170 but also just below 70 on 8/1  Postprandial periods:   After breakfast: Blood sugars are gradually rising, on average about 30 mg  After lunch: Glucose is generally flat but occasionally has relatively high readings early afternoon After dinner: Glucose readings are not consistent with not enough data available also.  Has at least 3 readings with blood sugars have gone up significantly over 200   PRE-MEAL Fasting Lunch Dinner Bedtime Overall  Glucose range:       Mean/median:  121  180  165  148  155   POST-MEAL PC Breakfast PC Lunch PC Dinner  Glucose range:     Mean/median:  152  191  167       PRE-MEAL Fasting Lunch Dinner Bedtime Overall  Glucose range:  203-262  178-249  108-277  128-236   Mean/median:     201   POST-MEAL PC Breakfast PC Lunch PC Dinner  Glucose range:     Mean/median:       Self-care: The diet that the patient has been following is: tries to limit sodas, fast food, fried foods .      Typical meal intake: Breakfast is mostly egg/ toast.  Usually eating grilled chicken at lunch and not always consistent with diet                Dietician visit, most recent: 6/15                Weight history:  Wt Readings from Last 3 Encounters:  10/26/18 299 lb 3.2 oz (135.7 kg)  08/17/18 298 lb 9.6 oz (135.4 kg)  05/03/18 280 lb (127 kg)    Glycemic control:   Lab Results  Component Value Date   HGBA1C 8.7 (H) 03/22/2019   HGBA1C 9.1 (H) 11/30/2018   HGBA1C 8.3 (A) 08/17/2018   Lab Results  Component Value Date   MICROALBUR <0.7 11/30/2018   [place] 113 (H) 03/22/2019   CREATININE 1.00 03/22/2019    Other problems discussed today: See review of systems   Allergies as of 03/26/2019      Reactions   Amoxicillin Anaphylaxis, Other (See Comments)   Has patient had a PCN reaction causing immediate rash,  facial/tongue/throat swelling, SOB or lightheadedness with hypotension: Yes Has patient had a PCN reaction causing severe rash involving mucus membranes or skin necrosis: No Has patient had a PCN reaction that required hospitalization No Has patient had a PCN reaction occurring within the last 10 years: No If all of the above answers are "NO", then may proceed with Cephalosporin use.   Penicillins Anaphylaxis, Other (See Comments)   Has patient had a PCN reaction causing immediate rash, facial/tongue/throat swelling, SOB or lightheadedness with hypotension: Yes Has patient had a PCN reaction causing severe rash involving  mucus membranes or skin necrosis: No Has patient had a PCN reaction that required hospitalization No Has patient had a PCN reaction occurring within the last 10 years: No If all of the above answers are "NO", then may proceed with Cephalosporin use.   Dilaudid  [hydromorphone Hcl]    Contrast Media [iodinated Diagnostic Agents] Hives   CT Contrast, had hives for 3 days after cardiac CT   Latex Rash   Trulicity [dulaglutide] Nausea Only, Palpitations      Medication List       Accurate as of March 26, 2019 11:59 PM. If you have any questions, ask your nurse or doctor.        fluvastatin XL 80 MG 24 hr tablet Commonly known as: LESCOL XL Take 1 tablet (80 mg total) by mouth at bedtime.   FreeStyle Libre 14 Day Sensor Misc 1 Units by Does not apply route every 14 (fourteen) days.   HumuLIN R U-500 KwikPen 500 UNIT/ML kwikpen Generic drug: insulin regular human CONCENTRATED INJECT 45 UNITS AT BREAKFAST AND 35 UNITS AT LUNCH AND DINNER. What changed: See the new instructions.   Pen Needles 33G X 4 MM Misc 1 each by Other route See admin instructions. Use 1 pen needle to inject insulin 4 times daily.   Tyler Aas FlexTouch 100 UNIT/ML Sopn FlexTouch Pen Generic drug: insulin degludec INJECT 54 UNITS INTO THE SKIN DAILY. What changed: See the new instructions.        Allergies:  Allergies  Allergen Reactions  . Amoxicillin Anaphylaxis and Other (See Comments)    Has patient had a PCN reaction causing immediate rash, facial/tongue/throat swelling, SOB or lightheadedness with hypotension: Yes Has patient had a PCN reaction causing severe rash involving mucus membranes or skin necrosis: No Has patient had a PCN reaction that required hospitalization No Has patient had a PCN reaction occurring within the last 10 years: No If all of the above answers are "NO", then may proceed with Cephalosporin use.  Marland Kitchen Penicillins Anaphylaxis and Other (See Comments)    Has patient had a PCN reaction causing immediate rash, facial/tongue/throat swelling, SOB or lightheadedness with hypotension: Yes Has patient had a PCN reaction causing severe rash involving mucus membranes or skin necrosis: No Has patient had a PCN reaction that required hospitalization No Has patient had a PCN reaction occurring within the last 10 years: No If all of the above answers are "NO", then may proceed with Cephalosporin use.  . Dilaudid  [Hydromorphone Hcl]   . Contrast Media [Iodinated Diagnostic Agents] Hives    CT Contrast, had hives for 3 days after cardiac CT  . Latex Rash  . Trulicity [Dulaglutide] Nausea Only and Palpitations    Past Medical History:  Diagnosis Date  . Complication of anesthesia    heartrate dropped with anesthesia  . Depressed   . Diabetes mellitus without complication ([place])   . Gallstones   . HTN (hypertension)   . Hyperglycemia   . Leukocytosis   . Low back pain   . Lump or mass in breast   . Malaise and fatigue   . Morbid obesity ([place])   . Sciatica   . Sleep apnea   . UTI (urinary tract infection)     Past Surgical History:  Procedure Laterality Date  . CHOLECYSTECTOMY  06/22/2012   Procedure: LAPAROSCOPIC CHOLECYSTECTOMY;  Surgeon: Jamesetta So, MD;  Location: [facility];  Service: General;  Laterality: N/A;  . ESOPHAGOGASTRODUODENOSCOPY N/A  06/09/2014   Procedure: ESOPHAGOGASTRODUODENOSCOPY (EGD);  Surgeon: Rogene Houston, MD;  Location: [facility];  Service: Endoscopy;  Laterality: N/A;  200-moved to 145 Ann to notify pt    Family History  Problem Relation Age of Onset  . Diabetes Mother   . Rheumatic fever Mother 75  . Migraines Father 26  . Hypertension Father   . Autism Brother   . Heart disease Neg Hx     Social History:  reports that she has never smoked. She has never used smokeless tobacco. She reports that she does not drink alcohol or use drugs.    Review of Systems    Lipid history:  has had muscle aches with pravastatin is being prescribed fluvastatin 80 mg However has not had adequate control  She has usually been regular with taking her Lescol  LDL has been consistently over 100 but slightly better on this visit  Lab Results  Component Value Date   CHOL 165 03/22/2019   CHOL 180 11/30/2018   CHOL 178 08/17/2018   Lab Results  Component Value Date   HDL 34.90 (L) 03/22/2019   HDL 37.00 (L) 11/30/2018   HDL 39.60 08/17/2018   Lab Results  Component Value Date   LDLCALC 113 (H) 03/22/2019   LDLCALC 124 (H) 11/30/2018   LDLCALC 118 (H) 08/17/2018   Lab Results  Component Value Date   TRIG 85.0 03/22/2019   TRIG 99.0 11/30/2018   TRIG 105.0 08/17/2018   Lab Results  Component Value Date   CHOLHDL 5 03/22/2019   CHOLHDL 5 11/30/2018   CHOLHDL 4 08/17/2018   Lab Results  Component Value Date   LDLDIRECT 112.0 07/19/2016             She was recommended 5000 units of Vitamin D3 for vitamin D deficiency Levels are still slightly low   Lab Results  Component Value Date   VD25OH 25.67 (L) 03/22/2019   VD25OH 23.05 (L) 07/19/2016   Lab Results  Component Value Date   CALCIUM 8.8 03/22/2019       Physical Examination:  There were no vitals taken for this visit.     ASSESSMENT:  Diabetes type 2, uncontrolled with morbid obesity     See history of present  illness for detailed discussion of current diabetes management, blood sugar patterns and problems identified  Her A1c was last 9.1  However fructosamine of 280 indicates somewhat better readings  She is on insulin alone Generally with better lifestyle her blood sugars are improving significantly even with requiring more insulin However blood sugars are generally higher midday and variably later in the day  Although she does have the Omni pod insulin pump she wants to wait until it safe to come into the office for longer visits  CGM results were reviewed in detail with patient and problems identified  HYPERCHOLESTEROLEMIA: She is taking Lescol and has some improvement in levels   PLAN:  Reduce bedtime dose to 15 units She will increase her morning Humulin to 80 units and take right on waking up She can reduce her lunchtime dose to 70 and continue 70 also at dinnertime No change in [place] as yet Needs to check her sugars more consistently especially in the evenings to get complete data on her freestyle libre  Consider restarting Bydureon or try Ozempic in the future if having difficulty with persistent high postprandial readings or weight loss not been achieved  There are no Patient Instructions on file for this visit.   Counseling time on subjects  discussed in assessment and plan sections is over 50% of today's 25 minute visit   Elayne Snare 03/27/2019, 6:13 PM   Note: This office note was prepared with Dragon voice recognition system technology. Any transcriptional errors that result from this process are unintentional.

## 2019-04-02 ENCOUNTER — Other Ambulatory Visit: Payer: Self-pay | Admitting: Endocrinology

## 2019-04-03 ENCOUNTER — Telehealth: Payer: PRIVATE HEALTH INSURANCE

## 2019-04-05 ENCOUNTER — Other Ambulatory Visit: Payer: Self-pay

## 2019-04-05 ENCOUNTER — Telehealth: Payer: Self-pay | Admitting: Endocrinology

## 2019-04-05 ENCOUNTER — Other Ambulatory Visit: Payer: Self-pay | Admitting: Endocrinology

## 2019-04-05 ENCOUNTER — Encounter: Payer: Self-pay | Admitting: Endocrinology

## 2019-04-05 ENCOUNTER — Ambulatory Visit (INDEPENDENT_AMBULATORY_CARE_PROVIDER_SITE_OTHER): Payer: PRIVATE HEALTH INSURANCE | Admitting: Endocrinology

## 2019-04-05 VITALS — BP 134/86 | HR 83 | Temp 97.5°F | Ht 64.0 in | Wt 304.6 lb

## 2019-04-05 DIAGNOSIS — E1165 Type 2 diabetes mellitus with hyperglycemia: Secondary | ICD-10-CM

## 2019-04-05 DIAGNOSIS — Z794 Long term (current) use of insulin: Secondary | ICD-10-CM | POA: Diagnosis not present

## 2019-04-05 LAB — GLUCOSE, POCT (MANUAL RESULT ENTRY): POC Glucose: 214 mg/dl — AB (ref 70–99)

## 2019-04-05 MED ORDER — ONDANSETRON HCL 4 MG PO TABS: 4.0000 mg | ORAL_TABLET | Freq: Three times a day (TID) | ORAL | 0 refills | Status: DC | PRN

## 2019-04-05 NOTE — Telephone Encounter (Signed)
Pt came into office today and saw Dr. Dwyane Dee as an urgent/acute visit.

## 2019-04-05 NOTE — Telephone Encounter (Signed)
FYI  You  Ronney Lion N Just now (8:20 AM)     Thank you for reaching out to [place] via MyChart. We have scheduled you for a virtual appointment with Dr.Kumar on Thursday August 20 at 8:45. Please call our office at [phone] if needing to cancel or reschedule this appointment.  Thank you, [facility]     This MyChart message has not been read.    Nira Retort  Patient Appointment Schedule Request Pool 2 days ago     Appointment Request From: Nira Retort  With Provider: Elayne Snare, MD [facility]]  Preferred Date Range: 04/03/2019 - 04/03/2019  Preferred Times: Any Time  Reason for visit: Request an Appointment  Comments: Since Thursday I have had increased in glucose levels over 200 which have made me feel weak, nauseated, shortness of breath. I would like to see a doctor asap

## 2019-04-05 NOTE — Addendum Note (Signed)
Addended by: Wyatt Haste F on: 04/05/2019 11:24 AM   Modules accepted: Orders

## 2019-04-05 NOTE — Progress Notes (Signed)
Patient ID: Julie Berry, female   DOB: 20-Sep-1980, 38 y.o.   MRN: [id number]           Reason for Appointment: Nausea and weakness  Referring physician: Gerarda Fraction   History of Present Illness:          Date of diagnosis of type 2 diabetes mellitus:   11/2013       Background history:  She thinks she had blood sugars of about 300 when she was first diagnosed; although she was complaining of some fatigue she was diagnosed with routine labs on her annual visit.  Her records indicate she had an A1c of 6.3 in 12/2011 She was started on metformin twice a day but she could not tolerate it well because of diarrhea.  She thinks she tried to take it for about 6 months with some improvement in her blood sugars, subsequently stopped.  Because she had persistently high blood sugars even with starting glipizide she was started on a regimen of Victoza and Jardiance on her initial consultation in 03/2015; her average blood sugar prior to this was 250 with baseline A1c 10.6   Recent history:        INSULIN regimen: Tresiba 64 units daily, Humulin R units 80 breakfast, 70 with lunch and 70 dinner, also 15 units at bedtime   Non-insulin hypoglycemic regimen: None, was on Bydureon 2 mg weekly  Her A1c is last 8.7, previously 9.1, has been in the past down to 7.3   Current blood sugar patterns from her glucose monitor download, current management and problems identified:  She is coming in today because of difficulty with her blood sugar control since last Thursday  She apparently had a low blood sugar on Wednesday night and she woke up with a headache and a blood sugar of 53  Subsequently she has had problems with decreased appetite, nausea and weakness and had to go back home from work on Thursday  However she did not call about her problems until this weekend  Because of her decreased appetite she has taken only sporadic doses of insulin.  However with this she is having generally much higher  blood sugars especially in the mornings  Using a freestyle libre sensor but she did not have a refill available last Thursday and is using fingersticks  No hypoglycemia since her episode on Wednesday night  She took only 25 units insulin instead of her usual 70 and blood sugar was 181 this morning before waking up time and now in the office is over 200  Side effects from medications have been: Metfomin causes diarrhea.  Candidiasis  from [place].  Nausea, palpitations from Trulicity ?  Back pain from Bydureon   Glucose monitoring:  Several times daily  Previous CGM AVERAGE is 120 as of 8/13, 77% within range and 13% below normal  Blood sugars from meter recently: PRE-MEAL Fasting Lunch Dinner  overnight Overall  Glucose range:  143-279  111, 283  128-233  181, 195   Mean/median:        POST-MEAL PC Breakfast PC Lunch PC Dinner  Glucose range:    243-284  Mean/median:       CGM average prior to her last visit was 155   Self-care: The diet that the patient has been following is: tries to limit sodas, fast food, fried foods .      Typical meal intake: Breakfast is mostly egg/ toast.  Usually eating grilled chicken at lunch and not always consistent with  diet                Dietician visit, most recent: 6/15                Weight history:  Wt Readings from Last 3 Encounters:  04/05/19 (!) 304 lb 9.6 oz (138.2 kg)  10/26/18 299 lb 3.2 oz (135.7 kg)  08/17/18 298 lb 9.6 oz (135.4 kg)    Glycemic control:   Lab Results  Component Value Date   HGBA1C 8.7 (H) 03/22/2019   HGBA1C 9.1 (H) 11/30/2018   HGBA1C 8.3 (A) 08/17/2018   Lab Results  Component Value Date   MICROALBUR <0.7 11/30/2018   LDLCALC 113 (H) 03/22/2019   CREATININE 1.00 03/22/2019    Other problems discussed today: See review of systems   Allergies as of 04/05/2019      Reactions   Amoxicillin Anaphylaxis, Other (See Comments)   Has patient had a PCN reaction causing immediate rash,  facial/tongue/throat swelling, SOB or lightheadedness with hypotension: Yes Has patient had a PCN reaction causing severe rash involving mucus membranes or skin necrosis: No Has patient had a PCN reaction that required hospitalization No Has patient had a PCN reaction occurring within the last 10 years: No If all of the above answers are "NO", then may proceed with Cephalosporin use.   Penicillins Anaphylaxis, Other (See Comments)   Has patient had a PCN reaction causing immediate rash, facial/tongue/throat swelling, SOB or lightheadedness with hypotension: Yes Has patient had a PCN reaction causing severe rash involving mucus membranes or skin necrosis: No Has patient had a PCN reaction that required hospitalization No Has patient had a PCN reaction occurring within the last 10 years: No If all of the above answers are "NO", then may proceed with Cephalosporin use.   Dilaudid  [hydromorphone Hcl]    Contrast Media [iodinated Diagnostic Agents] Hives   CT Contrast, had hives for 3 days after cardiac CT   Latex Rash   Trulicity [dulaglutide] Nausea Only, Palpitations      Medication List       Accurate as of April 05, 2019  9:39 AM. If you have any questions, ask your nurse or doctor.        fluvastatin XL 80 MG 24 hr tablet Commonly known as: LESCOL XL Take 1 tablet (80 mg total) by mouth at bedtime.   FreeStyle Libre 14 Day Sensor Misc 1 Units by Does not apply route every 14 (fourteen) days.   HumuLIN R U-500 KwikPen 500 UNIT/ML kwikpen Generic drug: insulin regular human CONCENTRATED Inject into the skin. Inject 80 units under the skin at breakfast, 70 at lunch and dinner, and 15 units at bedtime. What changed: Another medication with the same name was removed. Continue taking this medication, and follow the directions you see here. Changed by: Elayne Snare, MD   Pen Needles 33G X 4 MM Misc 1 each by Other route See admin instructions. Use 1 pen needle to inject insulin 4  times daily.   Tyler Aas FlexTouch 200 UNIT/ML Sopn Generic drug: Insulin Degludec Inject 64 Units into the skin daily. Inject 64 units under the skin once daily. What changed: Another medication with the same name was removed. Continue taking this medication, and follow the directions you see here. Changed by: Elayne Snare, MD       Allergies:  Allergies  Allergen Reactions  . Amoxicillin Anaphylaxis and Other (See Comments)    Has patient had a PCN reaction causing immediate rash,  facial/tongue/throat swelling, SOB or lightheadedness with hypotension: Yes Has patient had a PCN reaction causing severe rash involving mucus membranes or skin necrosis: No Has patient had a PCN reaction that required hospitalization No Has patient had a PCN reaction occurring within the last 10 years: No If all of the above answers are "NO", then may proceed with Cephalosporin use.  Marland Kitchen Penicillins Anaphylaxis and Other (See Comments)    Has patient had a PCN reaction causing immediate rash, facial/tongue/throat swelling, SOB or lightheadedness with hypotension: Yes Has patient had a PCN reaction causing severe rash involving mucus membranes or skin necrosis: No Has patient had a PCN reaction that required hospitalization No Has patient had a PCN reaction occurring within the last 10 years: No If all of the above answers are "NO", then may proceed with Cephalosporin use.  . Dilaudid  [Hydromorphone Hcl]   . Contrast Media [Iodinated Diagnostic Agents] Hives    CT Contrast, had hives for 3 days after cardiac CT  . Latex Rash  . Trulicity [Dulaglutide] Nausea Only and Palpitations    Past Medical History:  Diagnosis Date  . Complication of anesthesia    heartrate dropped with anesthesia  . Depressed   . Diabetes mellitus without complication ([place])   . Gallstones   . HTN (hypertension)   . Hyperglycemia   . Leukocytosis   . Low back pain   . Lump or mass in breast   . Malaise and fatigue   . Morbid  obesity ([place])   . Sciatica   . Sleep apnea   . UTI (urinary tract infection)     Past Surgical History:  Procedure Laterality Date  . CHOLECYSTECTOMY  06/22/2012   Procedure: LAPAROSCOPIC CHOLECYSTECTOMY;  Surgeon: Jamesetta So, MD;  Location: [facility];  Service: General;  Laterality: N/A;  . ESOPHAGOGASTRODUODENOSCOPY N/A 06/09/2014   Procedure: ESOPHAGOGASTRODUODENOSCOPY (EGD);  Surgeon: Rogene Houston, MD;  Location: [facility];  Service: Endoscopy;  Laterality: N/A;  200-moved to 145 Ann to notify pt    Family History  Problem Relation Age of Onset  . Diabetes Mother   . Rheumatic fever Mother 29  . Migraines Father 58  . Hypertension Father   . Autism Brother   . Heart disease Neg Hx     Social History:  reports that she has never smoked. She has never used smokeless tobacco. She reports that she does not drink alcohol or use drugs.    Review of Systems    NAUSEA: Since last Thursday she has had some nausea without vomiting, diarrhea or abdominal pain.  She has had this periodically She also said that she has been feeling weak which is not unusual for her when she has nausea and high blood sugars No fever or chills Nausea is mild today  Following is a copy of the previous note:  Lipid history:  has had muscle aches with pravastatin is being prescribed fluvastatin 80 mg However has not had adequate control  She has usually been regular with taking her Lescol  LDL has been consistently over 100 but slightly better on this visit  Lab Results  Component Value Date   CHOL 165 03/22/2019   CHOL 180 11/30/2018   CHOL 178 08/17/2018   Lab Results  Component Value Date   HDL 34.90 (L) 03/22/2019   HDL 37.00 (L) 11/30/2018   HDL 39.60 08/17/2018   Lab Results  Component Value Date   LDLCALC 113 (H) 03/22/2019   LDLCALC 124 (H)  11/30/2018   LDLCALC 118 (H) 08/17/2018   Lab Results  Component Value Date   TRIG 85.0 03/22/2019   TRIG 99.0 11/30/2018   TRIG  105.0 08/17/2018   Lab Results  Component Value Date   CHOLHDL 5 03/22/2019   CHOLHDL 5 11/30/2018   CHOLHDL 4 08/17/2018   Lab Results  Component Value Date   LDLDIRECT 112.0 07/19/2016             She was recommended 5000 units of Vitamin D3 for vitamin D deficiency Levels are still low   Lab Results  Component Value Date   VD25OH 25.67 (L) 03/22/2019   VD25OH 23.05 (L) 07/19/2016   Lab Results  Component Value Date   CALCIUM 8.8 03/22/2019      Physical Examination:  BP 134/86 (BP Location: Left Arm, Patient Position: Sitting, Cuff Size: Large)   Pulse 83   Temp (!) 97.5 F (36.4 C) (Tympanic)   Ht 5\' 4"  (1.626 m)   Wt (!) 304 lb 9.6 oz (138.2 kg)   SpO2 96%   BMI 52.28 kg/m      ASSESSMENT:  Diabetes type 2, with morbid obesity     See history of present illness for current assessment and management  Although her blood sugars were improving before last Wednesday she was starting to get occasional low sugars overnight However since she has had decreased appetite and nausea she has been afraid to take any insulin because of fear of hypoglycemia Most of her blood sugars are well over 200 now and only occasionally when she takes 70 units of insulin blood sugars may come down below 150  NAUSEA: This is improving and likely nonspecific and related to her hyperglycemia, may be mild gastroparesis   PLAN:  Since she should be able to start eating better today she will go back up on her insulin doses She will take all her recommended doses except cut on the doses down by 10 units  New insulin doses will be Humulin R 70-60-60 before meals and 10 units at bedtime  Tyler Aas will be 54 units  Restart CGM at home  Zofran as needed  There are no Patient Instructions on file for this visit.       Elayne Snare 04/05/2019, 9:39 AM   Note: This office note was prepared with Dragon voice recognition system technology. Any transcriptional errors that result  from this process are unintentional.

## 2019-04-08 ENCOUNTER — Ambulatory Visit: Payer: PRIVATE HEALTH INSURANCE | Admitting: Endocrinology

## 2019-05-16 ENCOUNTER — Other Ambulatory Visit: Payer: Self-pay | Admitting: Endocrinology

## 2019-06-07 ENCOUNTER — Telehealth: Payer: Self-pay

## 2019-06-07 ENCOUNTER — Other Ambulatory Visit: Payer: Self-pay

## 2019-06-07 MED ORDER — TRESIBA FLEXTOUCH 200 UNIT/ML ~~LOC~~ SOPN: 64.0000 [IU] | PEN_INJECTOR | Freq: Every day | SUBCUTANEOUS | 2 refills | Status: DC

## 2019-06-07 MED ORDER — HUMULIN R U-500 KWIKPEN 500 UNIT/ML ~~LOC~~ SOPN: PEN_INJECTOR | SUBCUTANEOUS | 1 refills | Status: DC

## 2019-06-07 NOTE — Telephone Encounter (Signed)
Called pt and she stated that she needed her Rx for Humulin R U500 updated to 80,70,70. This was done and resent to her pharmacy.

## 2019-06-07 NOTE — Telephone Encounter (Signed)
Patient called in stating that the dosage that's on her prescription is not the same dosage that he prescribe at the last appointment. Patient  is running out her medicine. Insurance won't approve because the new dosage doesn't match what's on file.    Please advise

## 2019-06-28 ENCOUNTER — Encounter: Payer: Self-pay | Admitting: Endocrinology

## 2019-06-28 ENCOUNTER — Other Ambulatory Visit: Payer: Self-pay

## 2019-06-28 ENCOUNTER — Ambulatory Visit (INDEPENDENT_AMBULATORY_CARE_PROVIDER_SITE_OTHER): Payer: PRIVATE HEALTH INSURANCE | Admitting: Endocrinology

## 2019-06-28 DIAGNOSIS — E1165 Type 2 diabetes mellitus with hyperglycemia: Secondary | ICD-10-CM | POA: Diagnosis not present

## 2019-06-28 DIAGNOSIS — Z794 Long term (current) use of insulin: Secondary | ICD-10-CM

## 2019-06-28 NOTE — Progress Notes (Signed)
Patient ID: Julie Berry, female   DOB: 10/16/1980, 38 y.o.   MRN: [id number]           Reason for Appointment:    Today's office visit was provided via telemedicine using video technique The patient was explained the limitations of evaluation and management by telemedicine and the availability of in person appointments.  The patient understood the limitations and agreed to proceed. Patient also understood that the telehealth visit is billable. . Location of the patient: Patient's home . Location of the provider: Physician office Only the patient and myself were participating in the encounter    Referring physician: Fusco   History of Present Illness:          Date of diagnosis of type 2 diabetes mellitus:   11/2013       Background history:  She thinks she had blood sugars of about 300 when she was first diagnosed; although she was complaining of some fatigue she was diagnosed with routine labs on her annual visit.  Her records indicate she had an A1c of 6.3 in 12/2011 She was started on metformin twice a day but she could not tolerate it well because of diarrhea.  She thinks she tried to take it for about 6 months with some improvement in her blood sugars, subsequently stopped.  Because she had persistently high blood sugars even with starting glipizide she was started on a regimen of Victoza and Jardiance on her initial consultation in 03/2015; her average blood sugar prior to this was 250 with baseline A1c 10.6   Recent history:        INSULIN regimen: Tresiba 64 units daily, Humulin R units 80 breakfast, 70 with lunch and 70 dinner, also 15 units at bedtime   Non-insulin hypoglycemic regimen: None, was on Bydureon 2 mg weekly  Her A1c is last 8.7 as of 03/2019, previously 9.1, has been in the past down to 7.3   Current blood sugar patterns from her glucose monitor download, current management and problems identified:  She did not come back for follow-up after her visit  3 months ago  Also has not had an A1c  Previously was having difficulties with decreased appetite and nausea and was taking much less insulin without her sugar going up  However her blood sugars are overall significantly high  She has not used her freestyle libre in about a week and is doing only sporadic blood sugars with fingersticks  Blood sugar patterns and problems identified are discussed below  She is exercising only a couple of days a week  Otherwise she thinks she is watching her diet but not clear if she has gained or lost any weight  Mealtimes: 9 AM, 1 PM and 6 PM  Side effects from medications have been: Metfomin causes diarrhea.  Candidiasis  from [place].  Nausea, palpitations from Trulicity ?  Back pain from Bydureon   Glucose monitoring:  Several times daily    Blood sugars from meter recently:   PRE-MEAL Fasting Lunch Dinner Bedtime Overall  Glucose range:  188-261  238  211, 273    Mean/median:        POST-MEAL PC Breakfast PC Lunch PC Dinner  Glucose range:   180  252  Mean/median:       CONTINUOUS GLUCOSE MONITORING RECORD INTERPRETATION    Dates of Recording: 10/19 through the 11/1  Sensor description: Freestyle libre  Results statistics:   CGM use % of time  83  Average and  SD  194, CV 30  Time in range    40    %  % Time Above 180  40  % Time above 250  18  % Time Below target  #2    Glycemic patterns summary: Her blood sugars are progressively higher from early morning and late night with moderate variability and tendency to low readings overnight to a variable extent  Hyperglycemic episodes are occurring primarily after meals especially breakfast and lunch and occasionally after dinner  Hypoglycemic episodes occurred only once or twice most notably on 10/12:22 and transiently another time  Overnight periods: Blood sugars decreasing gradually after midnight and lowest reading around 5 AM with a couple of low sugars as above   Preprandial periods: Blood sugars are rising even before breakfast and continue to be over 200 at lunch and dinner  Postprandial periods:   After breakfast:   Blood sugars are rising to a variable extent above preprandial numbers  After lunch:   Blood sugars are increasing to a variable extent but not considerably on an average  After dinner: Usually blood sugars are flat to lower after evening meal but quite variable   PRE-MEAL Fasting Lunch Dinner Bedtime Overall  Glucose range:       Mean/median:  155    202  194   POST-MEAL PC Breakfast PC Lunch PC Dinner  Glucose range:     Mean/median:  216  212  214   Previous readings:  PRE-MEAL Fasting Lunch Dinner  overnight Overall  Glucose range:  143-279  111, 283  128-233  181, 195   Mean/median:        POST-MEAL PC Breakfast PC Lunch PC Dinner  Glucose range:    243-284  Mean/median:         Self-care: The diet that the patient has been following is: tries to limit sodas, fast food, fried foods .      Typical meal intake: Breakfast is mostly egg/ toast.  Usually eating grilled chicken at lunch and not always consistent with diet                Dietician visit, most recent: 6/15                Weight history:  Wt Readings from Last 3 Encounters:  04/05/19 (!) 304 lb 9.6 oz (138.2 kg)  10/26/18 299 lb 3.2 oz (135.7 kg)  08/17/18 298 lb 9.6 oz (135.4 kg)    Glycemic control:   Lab Results  Component Value Date   HGBA1C 8.7 (H) 03/22/2019   HGBA1C 9.1 (H) 11/30/2018   HGBA1C 8.3 (A) 08/17/2018   Lab Results  Component Value Date   MICROALBUR <0.7 11/30/2018   LDLCALC 113 (H) 03/22/2019   CREATININE 1.00 03/22/2019    Other problems discussed today: See review of systems   Allergies as of 06/28/2019      Reactions   Amoxicillin Anaphylaxis, Other (See Comments)   Has patient had a PCN reaction causing immediate rash, facial/tongue/throat swelling, SOB or lightheadedness with hypotension: Yes Has patient  had a PCN reaction causing severe rash involving mucus membranes or skin necrosis: No Has patient had a PCN reaction that required hospitalization No Has patient had a PCN reaction occurring within the last 10 years: No If all of the above answers are "NO", then may proceed with Cephalosporin use.   Penicillins Anaphylaxis, Other (See Comments)   Has patient had a PCN reaction causing immediate rash, facial/tongue/throat swelling,  SOB or lightheadedness with hypotension: Yes Has patient had a PCN reaction causing severe rash involving mucus membranes or skin necrosis: No Has patient had a PCN reaction that required hospitalization No Has patient had a PCN reaction occurring within the last 10 years: No If all of the above answers are "NO", then may proceed with Cephalosporin use.   Dilaudid  [hydromorphone Hcl]    Contrast Media [iodinated Diagnostic Agents] Hives   CT Contrast, had hives for 3 days after cardiac CT   Latex Rash   Trulicity [dulaglutide] Nausea Only, Palpitations      Medication List       Accurate as of June 28, 2019  2:45 PM. If you have any questions, ask your nurse or doctor.        fluvastatin XL 80 MG 24 hr tablet Commonly known as: LESCOL XL Take 1 tablet (80 mg total) by mouth at bedtime.   FreeStyle Libre 14 Day Sensor Misc 1 Units by Does not apply route every 14 (fourteen) days.   HumuLIN R U-500 KwikPen 500 UNIT/ML kwikpen Generic drug: insulin regular human CONCENTRATED Inject 80 units under the skin before breakfast and 70 before lunch and dinner..   ondansetron 4 MG tablet Commonly known as: Zofran Take 1 tablet (4 mg total) by mouth every 8 (eight) hours as needed for nausea or vomiting.   Pen Needles 33G X 4 MM Misc 1 each by Other route See admin instructions. Use 1 pen needle to inject insulin 4 times daily.   Tyler Aas FlexTouch 200 UNIT/ML Sopn Generic drug: Insulin Degludec Inject 64 Units into the skin daily. Inject 64 units under  the skin once daily.       Allergies:  Allergies  Allergen Reactions  . Amoxicillin Anaphylaxis and Other (See Comments)    Has patient had a PCN reaction causing immediate rash, facial/tongue/throat swelling, SOB or lightheadedness with hypotension: Yes Has patient had a PCN reaction causing severe rash involving mucus membranes or skin necrosis: No Has patient had a PCN reaction that required hospitalization No Has patient had a PCN reaction occurring within the last 10 years: No If all of the above answers are "NO", then may proceed with Cephalosporin use.  Marland Kitchen Penicillins Anaphylaxis and Other (See Comments)    Has patient had a PCN reaction causing immediate rash, facial/tongue/throat swelling, SOB or lightheadedness with hypotension: Yes Has patient had a PCN reaction causing severe rash involving mucus membranes or skin necrosis: No Has patient had a PCN reaction that required hospitalization No Has patient had a PCN reaction occurring within the last 10 years: No If all of the above answers are "NO", then may proceed with Cephalosporin use.  . Dilaudid  [Hydromorphone Hcl]   . Contrast Media [Iodinated Diagnostic Agents] Hives    CT Contrast, had hives for 3 days after cardiac CT  . Latex Rash  . Trulicity [Dulaglutide] Nausea Only and Palpitations    Past Medical History:  Diagnosis Date  . Complication of anesthesia    heartrate dropped with anesthesia  . Depressed   . Diabetes mellitus without complication ([place])   . Gallstones   . HTN (hypertension)   . Hyperglycemia   . Leukocytosis   . Low back pain   . Lump or mass in breast   . Malaise and fatigue   . Morbid obesity ([place])   . Sciatica   . Sleep apnea   . UTI (urinary tract infection)     Past Surgical History:  Procedure Laterality Date  . CHOLECYSTECTOMY  06/22/2012   Procedure: LAPAROSCOPIC CHOLECYSTECTOMY;  Surgeon: Jamesetta So, MD;  Location: [facility];  Service: General;  Laterality: N/A;  .  ESOPHAGOGASTRODUODENOSCOPY N/A 06/09/2014   Procedure: ESOPHAGOGASTRODUODENOSCOPY (EGD);  Surgeon: Rogene Houston, MD;  Location: [facility];  Service: Endoscopy;  Laterality: N/A;  200-moved to 145 Ann to notify pt    Family History  Problem Relation Age of Onset  . Diabetes Mother   . Rheumatic fever Mother 62  . Migraines Father 60  . Hypertension Father   . Autism Brother   . Heart disease Neg Hx     Social History:  reports that she has never smoked. She has never used smokeless tobacco. She reports that she does not drink alcohol or use drugs.    Review of Systems    NAUSEA: Since last Thursday she has had some nausea without vomiting, diarrhea or abdominal pain.  She has had this periodically She also said that she has been feeling weak which is not unusual for her when she has nausea and high blood sugars No fever or chills Nausea is mild today  Following is a copy of the previous note:  Lipid history:  has had muscle aches with pravastatin is being prescribed fluvastatin 80 mg However has not had adequate control  She has usually been regular with taking her Lescol  LDL has been consistently over 100 but slightly better on this visit  Lab Results  Component Value Date   CHOL 165 03/22/2019   CHOL 180 11/30/2018   CHOL 178 08/17/2018   Lab Results  Component Value Date   HDL 34.90 (L) 03/22/2019   HDL 37.00 (L) 11/30/2018   HDL 39.60 08/17/2018   Lab Results  Component Value Date   LDLCALC 113 (H) 03/22/2019   LDLCALC 124 (H) 11/30/2018   LDLCALC 118 (H) 08/17/2018   Lab Results  Component Value Date   TRIG 85.0 03/22/2019   TRIG 99.0 11/30/2018   TRIG 105.0 08/17/2018   Lab Results  Component Value Date   CHOLHDL 5 03/22/2019   CHOLHDL 5 11/30/2018   CHOLHDL 4 08/17/2018   Lab Results  Component Value Date   LDLDIRECT 112.0 07/19/2016             She was recommended 5000 units of Vitamin D3 for vitamin D deficiency Levels are still  low   Lab Results  Component Value Date   VD25OH 25.67 (L) 03/22/2019   VD25OH 23.05 (L) 07/19/2016   Lab Results  Component Value Date   CALCIUM 8.8 03/22/2019      Physical Examination:  There were no vitals taken for this visit.     ASSESSMENT:  Diabetes type 2, with morbid obesity     See history of present illness for detailed discussion of current diabetes management, blood sugar patterns and problems identified   Her blood sugars are consistently high except early morning especially seen on her CGM Also recently has not done her CGM for the last 10 days As before she has postprandial hyperglycemia but mostly after waking up and at least until dinnertime   PLAN:  Encourage her to exercise and watch her diet consistently Need to increase her insulin doses especially to control her blood sugars rising between about 6 AM and 2 PM Discussed not adjusting the [place] at bedtime based on the actual bedtime reading Also since she has occasional low sugars overnight she will not take any  extra Humulin R at bedtime now  New Humulin U-500 doses will be as follows Morning insulin needs to be taken on waking up and she will go up to 95 units 30 minutes before lunch and dinner she will take 80 units and 60 units respectively No insulin at bedtime for now  Tyler Aas will be increased to 74 units and she will not reduce this based on the bedtime blood sugar  She will need to come back in a month for A1c  Restart CGM  Start exercise at least every other day on her exercise bike or other aerobic activity  There are no Patient Instructions on file for this visit.     Counseling time on subjects discussed in assessment and plan sections is over 50% of today's 25 minute visit   Elayne Snare 06/28/2019, 2:45 PM   Note: This office note was prepared with Dragon voice recognition system technology. Any transcriptional errors that result from this process are unintentional.

## 2019-07-13 ENCOUNTER — Telehealth: Payer: Self-pay | Admitting: Endocrinology

## 2019-07-13 ENCOUNTER — Other Ambulatory Visit: Payer: Self-pay

## 2019-07-13 MED ORDER — HUMULIN R U-500 KWIKPEN 500 UNIT/ML ~~LOC~~ SOPN: PEN_INJECTOR | SUBCUTANEOUS | 1 refills | Status: DC

## 2019-07-13 NOTE — Telephone Encounter (Signed)
MEDICATION: Humulin R Kwik Pen  PHARMACY:  [facility] in [place], [place]  IS THIS A 90 DAY SUPPLY :   IS PATIENT OUT OF MEDICATION: yes  IF NOT; HOW MUCH IS LEFT:   LAST APPOINTMENT DATE: @11 /04/2019  NEXT APPOINTMENT DATE:@12 /17/2020  DO WE HAVE YOUR PERMISSION TO LEAVE A DETAILED MESSAGE: yes  OTHER COMMENTS:    **Let patient know to contact pharmacy at the end of the day to make sure medication is ready. **  ** Please notify patient to allow 48-72 hours to process**  **Encourage patient to contact the pharmacy for refills or they can request refills through [facility]**

## 2019-07-13 NOTE — Telephone Encounter (Signed)
Rx sent 

## 2019-07-27 ENCOUNTER — Other Ambulatory Visit: Payer: Self-pay | Admitting: Endocrinology

## 2019-07-27 ENCOUNTER — Telehealth: Payer: Self-pay | Admitting: Endocrinology

## 2019-07-27 DIAGNOSIS — Z794 Long term (current) use of insulin: Secondary | ICD-10-CM

## 2019-07-27 DIAGNOSIS — E1165 Type 2 diabetes mellitus with hyperglycemia: Secondary | ICD-10-CM

## 2019-07-27 NOTE — Telephone Encounter (Signed)
Labs have been ordered

## 2019-07-27 NOTE — Telephone Encounter (Signed)
Patient is unable to come to [facility] to get labs done prior to virtual appointment scheduled on 08/10/19. Patient requests that Lab Orders be sent to [facility] on [address] in [place] Fax# [phone] (patient will have labs drawn at the above Lab on 08/05/19)

## 2019-07-27 NOTE — Telephone Encounter (Signed)
Will you order whatever labs you would like the pt to have?

## 2019-07-28 NOTE — Telephone Encounter (Signed)
Labs have been faxed.

## 2019-08-04 ENCOUNTER — Other Ambulatory Visit: Payer: Self-pay

## 2019-08-04 ENCOUNTER — Telehealth: Payer: Self-pay | Admitting: Endocrinology

## 2019-08-04 ENCOUNTER — Other Ambulatory Visit: Payer: Self-pay | Admitting: Endocrinology

## 2019-08-04 MED ORDER — HUMULIN R U-500 KWIKPEN 500 UNIT/ML ~~LOC~~ SOPN: PEN_INJECTOR | SUBCUTANEOUS | 1 refills | Status: DC

## 2019-08-04 NOTE — Telephone Encounter (Signed)
Patient called to advise that her insurance is changing 08/2019 - Humulin R is no longer covered but Novolin will be.  Currently she needs a 90 day RX for Humulin R sent to the [facility] in [place].  Will need RX updated to [facility] [place] for 2021   Patient also wanted to advise that she had her labs done at [facility] in [place] 08/04/2019 - virtual visit 08/10/2019

## 2019-08-04 NOTE — Telephone Encounter (Signed)
Rx sent 

## 2019-08-05 ENCOUNTER — Other Ambulatory Visit: Payer: PRIVATE HEALTH INSURANCE

## 2019-08-05 LAB — COMPREHENSIVE METABOLIC PANEL
ALT: 17 IU/L (ref 0–32)
AST: 16 IU/L (ref 0–40)
Albumin/Globulin Ratio: 1.2 (ref 1.2–2.2)
Albumin: 3.7 g/dL — ABNORMAL LOW (ref 3.8–4.8)
Alkaline Phosphatase: 81 IU/L (ref 39–117)
BUN/Creatinine Ratio: 18 (ref 9–23)
BUN: 14 mg/dL (ref 6–20)
Bilirubin Total: 0.2 mg/dL (ref 0.0–1.2)
CO2: 25 mmol/L (ref 20–29)
Calcium: 9.1 mg/dL (ref 8.7–10.2)
Chloride: 101 mmol/L (ref 96–106)
Creatinine, Ser: 0.79 mg/dL (ref 0.57–1.00)
GFR calc Af Amer: 109 mL/min/{1.73_m2} (ref >59)
GFR calc non Af Amer: 95 mL/min/{1.73_m2} (ref >59)
Globulin, Total: 3.2 g/dL (ref 1.5–4.5)
Glucose: 231 mg/dL — ABNORMAL HIGH (ref 65–99)
Potassium: 4 mmol/L (ref 3.5–5.2)
Sodium: 138 mmol/L (ref 134–144)
Total Protein: 6.9 g/dL (ref 6.0–8.5)

## 2019-08-05 LAB — HGB A1C W/O EAG: Hgb A1c MFr Bld: 9.5 % — ABNORMAL HIGH (ref 4.8–5.6)

## 2019-08-06 ENCOUNTER — Other Ambulatory Visit: Payer: Self-pay | Admitting: Endocrinology

## 2019-08-09 ENCOUNTER — Other Ambulatory Visit: Payer: Self-pay

## 2019-08-10 ENCOUNTER — Other Ambulatory Visit: Payer: Self-pay

## 2019-08-10 ENCOUNTER — Encounter: Payer: Self-pay | Admitting: Endocrinology

## 2019-08-10 ENCOUNTER — Ambulatory Visit (INDEPENDENT_AMBULATORY_CARE_PROVIDER_SITE_OTHER): Payer: PRIVATE HEALTH INSURANCE | Admitting: Endocrinology

## 2019-08-10 DIAGNOSIS — E7800 Pure hypercholesterolemia, unspecified: Secondary | ICD-10-CM | POA: Diagnosis not present

## 2019-08-10 DIAGNOSIS — E782 Mixed hyperlipidemia: Secondary | ICD-10-CM

## 2019-08-10 DIAGNOSIS — Z794 Long term (current) use of insulin: Secondary | ICD-10-CM | POA: Diagnosis not present

## 2019-08-10 DIAGNOSIS — E1165 Type 2 diabetes mellitus with hyperglycemia: Secondary | ICD-10-CM | POA: Diagnosis not present

## 2019-08-10 MED ORDER — OZEMPIC (0.25 OR 0.5 MG/DOSE) 2 MG/1.5ML ~~LOC~~ SOPN: 0.5000 mg | PEN_INJECTOR | SUBCUTANEOUS | 2 refills | Status: DC

## 2019-08-10 NOTE — Patient Instructions (Signed)
Increase your Humulin R, U-500 up to 110 units on waking up, 100 units, 30 minutes before breakfast and 70 units, 30 minutes before dinner  If after 1 week your morning sugars are still over 150 increase the Tresiba to 70 units  I will be sending prescription for [facility]  Start OZEMPIC injections, 0.25 mg on the pen once weekly on the same day of the week. You will feel fullness of the stomach with starting the medication and should try to keep the portions at meals small.  You may experience nausea in the first few days which usually gets better over time, may use Zofran  After 4 weeks increase the dose to 0.5 mg weekly if no nausea  Useful website: Ozempicsupport.com Also can get a co-pay card on [email or web address]  Since my nurse educator is not available please contact [employer] for the T-insulin pump at [phone].  You can also talk to. Christopher at [phone]

## 2019-08-10 NOTE — Progress Notes (Signed)
Patient ID: Julie Berry, female   DOB: Nov 23, 1980, 38 y.o.   MRN: [id number]           Reason for Appointment:    Today's office visit was provided via telemedicine using video technique The patient was explained the limitations of evaluation and management by telemedicine and the availability of in person appointments.  The patient understood the limitations and agreed to proceed. Patient also understood that the telehealth visit is billable. . Location of the patient: Patient's home . Location of the provider: Physician office Only the patient and myself were participating in the encounter   Referring physician: F[place]co   History of Present Illness:          Date of diagnosis of type 2 diabetes mellitus:   11/2013       Background history:  She thinks she had blood sugars of about 300 when she was first diagnosed; although she was complaining of some fatigue she was diagnosed with routine labs on her annual visit.  Her records indicate she had an A1c of 6.3 in 12/2011 She was started on metformin twice a day but she could not tolerate it well because of diarrhea.  She thinks she tried to take it for about 6 months with some improvement in her blood sugars, subsequently stopped.  Because she had persistently high blood sugars even with starting glipizide she was started on a regimen of Victoza and Jardiance on her initial consultation in 03/2015; her average blood sugar prior to this was 250 with baseline A1c 10.6   Recent history:        INSULIN regimen: Tresiba 64 units daily, Humulin R units 80 breakfast, 70 with lunch and 70 dinner, also 15 units at bedtime   Non-insulin hypoglycemic regimen: None  Her A1c is higher at 9.5, last 1 previously was 8.7 as of 03/2019   Current blood sugar patterns from her glucose monitor download, current management and problems identified:  She did not increase her insulin as directed on her last visit  She thinks she was starting to get  some low sugars after the initial adjustment but now her blood sugars are markedly increased and averaging about 200 overall but consistently over 200 during the day  CGM patterns are discussed below  As before she has significant hypoglycemia during the day even though she thinks she is watching her diet  She is trying to do a little walking in the neighborhood but not losing weight  She has not pursued the OmniPod pump that was discussed previously  She does adjust her mealtime dose mostly based on the Premeal blood sugar rather than what she is eating and has only a few readings recently on fingersticks  Mealtimes: 9 AM, 1 PM and 6 PM  Side effects from medications have been: Metfomin causes diarrhea.  Candidiasis  from [place].  Nausea, palpitations from Trulicity ?  Back pain from Bydureon   Glucose monitoring:  Several times daily   CONTINUOUS GLUCOSE MONITORING RECORD INTERPRETATION    Dates of Recording: 11/30 through 08/01/2019  Sensor description: [employer]  Results statistics:   CGM use % of time  76  Average and SD  200, CV 23  Time in range     36%, was 40  % Time Above 180  51  % Time above 250  13  % Time Below target  0   Averages with CGM and glucose range from fingersticks  PRE-MEAL Fasting [facility]  4-6 AM Overall  Glucose range:       Mean/median:  176  222  222  164  200   POST-MEAL PC Breakfast PC Lunch PC Dinner  Glucose range:     Mean/median:  202  211  225    Glycemic patterns summary: Blood sugars are THE highest around midday However blood sugars are above target range throughout the day and only are below 180 between midnight-7 AM No hypoglycemia Also no consistent postprandial spikes  Hyperglycemic episodes are occurring daily although significant spikes occur only periodically after different meals Blood sugars are generally rising in the morning hours after about 6-7 AM and only start coming down after about 9  PM  Hypoglycemic episodes not occurring  Overnight periods: Blood sugars are relatively stable overall but feels some variability and averaging about 160-190  Preprandial periods: Blood sugars are mostly mildly increased at breakfast, averaging about 160-180 However blood sugars at lunch and dinnertime are averaging over 200 fairly consistently  Postprandial periods:   After breakfast: Blood sugars already are rising at breakfast time and continue to go up until midday and then averaging over 200  After lunch and dinner blood sugars are generally flat with mildly increased blood sugars after dinner with some variability    Self-care: The diet that the patient has been following is: tries to limit sodas, fast food, fried foods .      Typical meal intake: Breakfast is mostly egg/ toast.  Usually eating grilled chicken at lunch and not always consistent with diet                Dietician visit, most recent: 6/15                Weight history:  Wt Readings from Last 3 Encounters:  04/05/19 (!) 304 lb 9.6 oz (138.2 kg)  10/26/18 299 lb 3.2 oz (135.7 kg)  08/17/18 298 lb 9.6 oz (135.4 kg)    Glycemic control:   Lab Results  Component Value Date   HGBA1C 9.5 (H) 08/04/2019   HGBA1C 8.7 (H) 03/22/2019   HGBA1C 9.1 (H) 11/30/2018   Lab Results  Component Value Date   MICROALBUR <0.7 11/30/2018   LDLCALC 113 (H) 03/22/2019   CREATININE 0.79 08/04/2019    Other problems discussed today: See review of systems   Allergies as of 08/10/2019      Reactions   Amoxicillin Anaphylaxis, Other (See Comments)   Has patient had a PCN reaction causing immediate rash, facial/tongue/throat swelling, SOB or lightheadedness with hypotension: Yes Has patient had a PCN reaction causing severe rash involving mucus membranes or skin necrosis: No Has patient had a PCN reaction that required hospitalization No Has patient had a PCN reaction occurring within the last 10 years: No If all of the  above answers are "NO", then may proceed with Cephalosporin use.   Penicillins Anaphylaxis, Other (See Comments)   Has patient had a PCN reaction causing immediate rash, facial/tongue/throat swelling, SOB or lightheadedness with hypotension: Yes Has patient had a PCN reaction causing severe rash involving mucus membranes or skin necrosis: No Has patient had a PCN reaction that required hospitalization No Has patient had a PCN reaction occurring within the last 10 years: No If all of the above answers are "NO", then may proceed with Cephalosporin use.   Dilaudid  [hydromorphone Hcl]    Contrast Media [iodinated Diagnostic Agents] Hives   CT Contrast, had hives for 3 days after cardiac CT  Latex Rash   Trulicity [dulaglutide] Nausea Only, Palpitations      Medication List       Accurate as of August 10, 2019 11:00 AM. If you have any questions, ask your nurse or doctor.        fluvastatin XL 80 MG 24 hr tablet Commonly known as: LESCOL XL Take 1 tablet (80 mg total) by mouth at bedtime.   FreeStyle Libre 14 Day Sensor Misc 1 Units by Does not apply route every 14 (fourteen) days.   HumuLIN R U-500 KwikPen 500 UNIT/ML kwikpen Generic drug: insulin regular human CONCENTRATED INJECT 45 UNITS AT BREAKFAST AND 35 UNITS AT LUNCH AND DINNER.   ondansetron 4 MG tablet Commonly known as: Zofran Take 1 tablet (4 mg total) by mouth every 8 (eight) hours as needed for nausea or vomiting.   Pen Needles 33G X 4 MM Misc 1 each by Other route See admin instructions. Use 1 pen needle to inject insulin 4 times daily.   Tyler Aas FlexTouch 200 UNIT/ML Sopn Generic drug: Insulin Degludec Inject 64 Units into the skin daily. Inject 64 units under the skin once daily. What changed:   how much to take  additional instructions       Allergies:  Allergies  Allergen Reactions  . Amoxicillin Anaphylaxis and Other (See Comments)    Has patient had a PCN reaction causing immediate rash,  facial/tongue/throat swelling, SOB or lightheadedness with hypotension: Yes Has patient had a PCN reaction causing severe rash involving mucus membranes or skin necrosis: No Has patient had a PCN reaction that required hospitalization No Has patient had a PCN reaction occurring within the last 10 years: No If all of the above answers are "NO", then may proceed with Cephalosporin use.  Marland Kitchen Penicillins Anaphylaxis and Other (See Comments)    Has patient had a PCN reaction causing immediate rash, facial/tongue/throat swelling, SOB or lightheadedness with hypotension: Yes Has patient had a PCN reaction causing severe rash involving mucus membranes or skin necrosis: No Has patient had a PCN reaction that required hospitalization No Has patient had a PCN reaction occurring within the last 10 years: No If all of the above answers are "NO", then may proceed with Cephalosporin use.  . Dilaudid  [Hydromorphone Hcl]   . Contrast Media [Iodinated Diagnostic Agents] Hives    CT Contrast, had hives for 3 days after cardiac CT  . Latex Rash  . Trulicity [Dulaglutide] Nausea Only and Palpitations    Past Medical History:  Diagnosis Date  . Complication of anesthesia    heartrate dropped with anesthesia  . Depressed   . Diabetes mellitus without complication ([place])   . Gallstones   . HTN (hypertension)   . Hyperglycemia   . Leukocytosis   . Low back pain   . Lump or mass in breast   . Malaise and fatigue   . Morbid obesity ([place])   . Sciatica   . Sleep apnea   . UTI (urinary tract infection)     Past Surgical History:  Procedure Laterality Date  . CHOLECYSTECTOMY  06/22/2012   Procedure: LAPAROSCOPIC CHOLECYSTECTOMY;  Surgeon: Jamesetta So, MD;  Location: [facility];  Service: General;  Laterality: N/A;  . ESOPHAGOGASTRODUODENOSCOPY N/A 06/09/2014   Procedure: ESOPHAGOGASTRODUODENOSCOPY (EGD);  Surgeon: Rogene Houston, MD;  Location: [facility];  Service: Endoscopy;  Laterality: N/A;  200-moved  to 145 Ann to notify pt    Family History  Problem Relation Age of Onset  . Diabetes  Mother   . Rheumatic fever Mother 12  . Migraines Father 71  . Hypertension Father   . Autism Brother   . Heart disease Neg Hx     Social History:  reports that she has never smoked. She has never used smokeless tobacco. She reports that she does not drink alcohol or use drugs.    Review of Systems     Lipid history:  has had muscle aches with pravastatin is being prescribed fluvastatin 80 mg However has not had adequate control  She has usually been regular with taking her Lescol  LDL has been consistently over 100 but slightly better on this visit  Lab Results  Component Value Date   CHOL 165 03/22/2019   CHOL 180 11/30/2018   CHOL 178 08/17/2018   Lab Results  Component Value Date   HDL 34.90 (L) 03/22/2019   HDL 37.00 (L) 11/30/2018   HDL 39.60 08/17/2018   Lab Results  Component Value Date   LDLCALC 113 (H) 03/22/2019   LDLCALC 124 (H) 11/30/2018   LDLCALC 118 (H) 08/17/2018   Lab Results  Component Value Date   TRIG 85.0 03/22/2019   TRIG 99.0 11/30/2018   TRIG 105.0 08/17/2018   Lab Results  Component Value Date   CHOLHDL 5 03/22/2019   CHOLHDL 5 11/30/2018   CHOLHDL 4 08/17/2018   Lab Results  Component Value Date   LDLDIRECT 112.0 07/19/2016              She was recommended 5000 units of Vitamin D3 for vitamin D deficiency Levels are still low   Lab Results  Component Value Date   VD25OH 25.67 (L) 03/22/2019   VD25OH 23.05 (L) 07/19/2016   Lab Results  Component Value Date   CALCIUM 9.1 08/04/2019      Physical Examination:  There were no vitals taken for this visit.     ASSESSMENT:  Diabetes type 2, with morbid obesity     See history of present illness for detailed discussion of current diabetes management, blood sugar patterns and problems identified  A1c is 9.5  She is on insulin alone and very insulin resistant Her blood  sugars are somewhat variable although this may be because of using U-500 insulin Most of her high sugars are during the day and she is usually is not able to comply with increasing her insulin as needed and continues to decrease her insulin arbitrarily Although she has previously tried GLP-1 drugs without success it may be worthwhile trying this again since there are no other options   PLAN:    Insulin will be increased consistently during the day May also increase basal insulin after 1 week New doses for the U-500 will be 110-100-70 She may reduce her dose only if the blood sugars are low within 5 or 6 hours of the Humulin doses Increase walking for exercise Trial of Ozempic 0.25 mg weekly and may increase the dose if tolerated She can use Zofran if needed for nausea  Detailed information given Discussed actions of GLP-1 drugs again  Since she has somewhat variable blood sugars will do better with the closed-loop insulin pump and discussed advantages of the T-insulin pump which will be simpler to use considering that it has a Dexcom sensor needing no calibration  HYPERLIPIDEMIA: Check lipids on the next visit, she thinks she is compliant with her Lescol XL   Patient Instructions  Increase your Humulin R, U-500 up to 110 units on waking up, 100  units, 30 minutes before breakfast and 70 units, 30 minutes before dinner  If after 1 week your morning sugars are still over 150 increase the Tresiba to 70 units  I will be sending prescription for [facility]  Start OZEMPIC injections, 0.25 mg on the pen once weekly on the same day of the week. You will feel fullness of the stomach with starting the medication and should try to keep the portions at meals small.  You may experience nausea in the first few days which usually gets better over time, may use Zofran  After 4 weeks increase the dose to 0.5 mg weekly if no nausea  Useful website: Ozempicsupport.com Also can get a co-pay card on  [email or web address]  Since my nurse educator is not available please contact [employer] for the T-insulin pump at [phone].  You can also talk to. Christopher at [phone]          Counseling time on subjects discussed in assessment and plan sections is over 50% of today's 25 minute visit   Elayne Snare 08/10/2019, 11:00 AM   Note: This office note was prepared with Dragon voice recognition system technology. Any transcriptional errors that result from this process are unintentional.

## 2019-08-11 ENCOUNTER — Telehealth: Payer: Self-pay

## 2019-08-11 NOTE — Telephone Encounter (Signed)
Received fax from [email or web address] indicating that a PA is needed for Ozempic. Attempted to do this pa and it was not able to be completed through the online platform. Then attempted to complete it by calling [employer], the pharmacy benefits manager for her insurance company. They stated that the plan deals with their PA requests directly, and I was transferred to the plan. They stated that all PA requests are done via fax and they were given the fax number to this office and will be faxing the PA request in order to complete it and fax it back.

## 2019-08-16 ENCOUNTER — Telehealth: Payer: Self-pay | Admitting: Endocrinology

## 2019-08-16 NOTE — Telephone Encounter (Signed)
Patient called and requested that MD Orders for pump set up and education be sent to [facility] for her.  Their contact number [phone] and the fax number is [phone]

## 2019-08-17 NOTE — Telephone Encounter (Signed)
Called pt and left detailed voicemail with her informing her to contact [facility] and have them fax whatever request they need in order to provide her with whatever supplies she is in need of.

## 2019-08-17 NOTE — Telephone Encounter (Signed)
Attempted to call again and answer was received but then phone was disconnected. Attempted to call back two more times after this with no success at getting anyone.

## 2019-08-17 NOTE — Telephone Encounter (Signed)
Attempted to call twice. Both times I was on hold for 15 minutes and then it diverted to a voicemail system. No voicemail was left. Will attempt to call back at a later time.

## 2019-08-23 ENCOUNTER — Telehealth: Payer: Self-pay | Admitting: Endocrinology

## 2019-08-23 NOTE — Telephone Encounter (Signed)
Cover my meds called and the PA they received is not completely viewable.  The top half of each sheet is unreadable.  Please resend fax to [phone]

## 2019-08-24 NOTE — Telephone Encounter (Signed)
Pt's prior authorization requests can only be completed via fax, according to pt's health insurance plan which was contacted in December. This was faxed to them on 08/22/2018. It will be refaxed to [employer], pt's insurance company to complete PA.

## 2019-08-25 NOTE — Telephone Encounter (Signed)
Received fax from [facility], pts insurance company stating that the pt has been approved for Ozempic. This PA expires on 08/23/2020.

## 2019-09-15 ENCOUNTER — Other Ambulatory Visit: Payer: Self-pay

## 2019-09-15 ENCOUNTER — Telehealth: Payer: Self-pay | Admitting: Endocrinology

## 2019-09-15 MED ORDER — FLUCONAZOLE 150 MG PO TABS: 150.0000 mg | ORAL_TABLET | Freq: Every day | ORAL | 1 refills | Status: DC

## 2019-09-15 NOTE — Telephone Encounter (Signed)
Patient requests a new RX for (patient states Dr. Dwyane Dee has prescribed this before-patient is experiencing irritation/discomfort-blood sugars have been running a little high (321 once). Blood sugars currently 263. Patient has been under stress:  MEDICATION: Fluconazole (Diflucan)  PHARMACY:   [facility], [address] Phone:  [phone]  Fax:  [phone]       IS THIS A 90 DAY SUPPLY : No  IS PATIENT OUT OF MEDICATION: Yes  IF NOT; HOW MUCH IS LEFT: 0  LAST APPOINTMENT DATE: @1 /11/2019  NEXT APPOINTMENT DATE:@Visit  date not found  DO WE HAVE YOUR PERMISSION TO LEAVE A DETAILED MESSAGE: Yes  OTHER COMMENTS: Patient requests to be called at ph# [phone] re: when RX has been sent to [facility]   **Let patient know to contact pharmacy at the end of the day to make sure medication is ready. **  ** Please notify patient to allow 48-72 hours to process**  **Encourage patient to contact the pharmacy for refills or they can request refills through [facility]**

## 2019-09-15 NOTE — Telephone Encounter (Signed)
Called pt and left voicemail letting her know that Rx was sent and requested that she call and schedule a f/u visit with labs prior.

## 2019-09-15 NOTE — Telephone Encounter (Signed)
Ok to send

## 2019-09-15 NOTE — Telephone Encounter (Signed)
Okay to send prescription. Also needs to set up a follow-up visit preferably with labs as soon as possible for high sugars

## 2019-09-17 NOTE — Telephone Encounter (Signed)
Attempted to schedule with the patient but she was not able to do it at the time she was taking care of her mother in the hospital-patient has agreed to call the office to schedule f/u either VV or in-person-

## 2019-12-29 ENCOUNTER — Encounter: Payer: Self-pay | Admitting: Endocrinology

## 2020-01-05 ENCOUNTER — Telehealth: Payer: Self-pay | Admitting: Endocrinology

## 2020-01-05 NOTE — Telephone Encounter (Signed)
Patient dismissed from [facility] by Elayne Snare, MD, effective 12/29/19. Dismissal Letter sent out by 1st class mail. [employer]

## 2020-01-18 ENCOUNTER — Encounter: Payer: Self-pay | Admitting: Emergency Medicine

## 2020-01-18 ENCOUNTER — Ambulatory Visit (INDEPENDENT_AMBULATORY_CARE_PROVIDER_SITE_OTHER)
Admission: RE | Admit: 2020-01-18 | Discharge: 2020-01-18 | Disposition: A | Discharge status: Other Acute Inpatient Hospital | Payer: PRIVATE HEALTH INSURANCE | Source: Ambulatory Visit

## 2020-01-18 ENCOUNTER — Ambulatory Visit
Admission: EM | Admit: 2020-01-18 | Discharge: 2020-01-18 | Disposition: A | Discharge status: Home / Self Care | Payer: PRIVATE HEALTH INSURANCE | Attending: Emergency Medicine | Admitting: Emergency Medicine

## 2020-01-18 ENCOUNTER — Inpatient Hospital Stay: Admission: RE | Admit: 2020-01-18 | Payer: PRIVATE HEALTH INSURANCE | Source: Ambulatory Visit

## 2020-01-18 ENCOUNTER — Other Ambulatory Visit: Payer: Self-pay

## 2020-01-18 DIAGNOSIS — G629 Polyneuropathy, unspecified: Secondary | ICD-10-CM | POA: Diagnosis not present

## 2020-01-18 DIAGNOSIS — M542 Cervicalgia: Secondary | ICD-10-CM

## 2020-01-18 DIAGNOSIS — R202 Paresthesia of skin: Secondary | ICD-10-CM

## 2020-01-18 DIAGNOSIS — M25519 Pain in unspecified shoulder: Secondary | ICD-10-CM

## 2020-01-18 DIAGNOSIS — M25511 Pain in right shoulder: Secondary | ICD-10-CM

## 2020-01-18 MED ORDER — METHYLPREDNISOLONE SODIUM SUCC 125 MG IJ SOLR
60.0000 mg | Freq: Once | INTRAMUSCULAR | Status: AC
  Administered 2020-01-18: 60 mg via INTRAMUSCULAR

## 2020-01-18 MED ORDER — ACETAMINOPHEN 500 MG PO TABS: 500.0000 mg | ORAL_TABLET | Freq: Four times a day (QID) | ORAL | 0 refills | Status: DC | PRN

## 2020-01-18 MED ORDER — PREDNISONE 10 MG PO TABS: 10.0000 mg | ORAL_TABLET | Freq: Every day | ORAL | 0 refills | Status: AC

## 2020-01-18 MED ORDER — GABAPENTIN 100 MG PO CAPS: 100.0000 mg | ORAL_CAPSULE | Freq: Three times a day (TID) | ORAL | 0 refills | Status: DC

## 2020-01-18 NOTE — ED Triage Notes (Signed)
Pain to RT shoulder shoulder blade that started last night, hurts worse when pt moves her neck or takes deep breath.  Pain radiates down her arm and is causing some numbness in her fingers. Denies any injury.

## 2020-01-18 NOTE — Discharge Instructions (Addendum)
Come to the Urgent Care for in-person evaluation.

## 2020-01-18 NOTE — Discharge Instructions (Addendum)
Prednisone and Tylenol were prescribed for neck and shoulder pain Gabapentin was prescribed for tingling numbness Was advised to follow-up with PCP Call 911 or go to ED if you having any new or worsening symptoms
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
## 2020-01-18 NOTE — ED Provider Notes (Signed)
Virtual Visit via Video Note:  Julie Berry  initiated request for Telemedicine visit with [facility] team. I connected with Julie Berry  on 01/18/2020 at 4:14 PM  for a synchronized telemedicine visit using a video enabled HIPPA compliant telemedicine application. I verified that I am speaking with Julie Berry  using two identifiers. Sharion Balloon, NP  was physically located in a [facility] Urgent care site and KARINNA HANSARD was located at a different location.   The limitations of evaluation and management by telemedicine as well as the availability of in-person appointments were discussed. Patient was informed that she  may incur a bill ( including co-pay) for this virtual visit encounter. Julie Berry  expressed understanding and gave verbal consent to proceed with virtual visit.     History of Present Illness:Julie Berry  is a 39 y.o. female presents for evaluation of right shoulder and upper back pain since last night.  She also reports numbness and tingling in her right arm. The pain is worse with raising up arm, turning her neck, and deep breath.  It feels like "burning" and like muscle pain.  No falls or injury.  She denies fever, chills, chest pain, shortness of breath, nausea, diaphoresis, dizziness, or other symptoms.     Allergies  Allergen Reactions  . Amoxicillin Anaphylaxis and Other (See Comments)    Has patient had a PCN reaction causing immediate rash, facial/tongue/throat swelling, SOB or lightheadedness with hypotension: Yes Has patient had a PCN reaction causing severe rash involving mucus membranes or skin necrosis: No Has patient had a PCN reaction that required hospitalization No Has patient had a PCN reaction occurring within the last 10 years: No If all of the above answers are "NO", then may proceed with Cephalosporin use.  Marland Kitchen Penicillins Anaphylaxis and Other (See Comments)    Has patient had a PCN reaction causing immediate  rash, facial/tongue/throat swelling, SOB or lightheadedness with hypotension: Yes Has patient had a PCN reaction causing severe rash involving mucus membranes or skin necrosis: No Has patient had a PCN reaction that required hospitalization No Has patient had a PCN reaction occurring within the last 10 years: No If all of the above answers are "NO", then may proceed with Cephalosporin use.  . Dilaudid  [Hydromorphone Hcl]   . Contrast Media [Iodinated Diagnostic Agents] Hives    CT Contrast, had hives for 3 days after cardiac CT  . Latex Rash  . Trulicity [Dulaglutide] Nausea Only and Palpitations     Past Medical History:  Diagnosis Date  . Complication of anesthesia    heartrate dropped with anesthesia  . Depressed   . Diabetes mellitus without complication ([place])   . Gallstones   . HTN (hypertension)   . Hyperglycemia   . Leukocytosis   . Low back pain   . Lump or mass in breast   . Malaise and fatigue   . Morbid obesity ([place])   . Sciatica   . Sleep apnea   . UTI (urinary tract infection)      Social History   Tobacco Use  . Smoking status: Never Smoker  . Smokeless tobacco: Never Used  Substance Use Topics  . Alcohol use: No  . Drug use: No    ROS: as stated in HPI.  All other systems reviewed and negative.      Observations/Objective: Physical Exam  VITALS: Patient denies fever. GENERAL: Alert, appears well and in no acute distress.  HEENT: Atraumatic. NECK: Normal movements of the head and neck. CARDIOPULMONARY: No increased WOB. Speaking in clear sentences. I:E ratio WNL.  MS: Limited ROM in RUE due to discomfort. PSYCH: Pleasant and cooperative, well-groomed. Speech normal rate and rhythm. Affect is appropriate. Insight and judgement are appropriate. Attention is focused, linear, and appropriate.  NEURO: CN grossly intact. Oriented as arrived to appointment on time with no prompting. Moves both UE equally.  SKIN: No obvious lesions, wounds, erythema, or  cyanosis noted on face or hands.   Assessment and Plan:    ICD-10-CM   1. Pain in joint of right shoulder  M25.511   2. Paresthesia of right arm  R20.2        Follow Up Instructions: Instructed patient to come for in-person evaluation at [facility].  She agrees to this plan of care.      I discussed the assessment and treatment plan with the patient. The patient was provided an opportunity to ask questions and all were answered. The patient agreed with the plan and demonstrated an understanding of the instructions.   The patient was advised to call back or seek an in-person evaluation if the symptoms worsen or if the condition fails to improve as anticipated.      Sharion Balloon, NP  01/18/2020 4:14 PM         Sharion Balloon, NP 01/18/20 1614

## 2020-05-11 ENCOUNTER — Other Ambulatory Visit: Payer: Self-pay

## 2020-05-11 ENCOUNTER — Ambulatory Visit
Admission: EM | Admit: 2020-05-11 | Discharge: 2020-05-11 | Disposition: A | Discharge status: Home / Self Care | Payer: PRIVATE HEALTH INSURANCE | Attending: Emergency Medicine | Admitting: Emergency Medicine

## 2020-05-11 DIAGNOSIS — S46819A Strain of other muscles, fascia and tendons at shoulder and upper arm level, unspecified arm, initial encounter: Secondary | ICD-10-CM

## 2020-05-11 DIAGNOSIS — M542 Cervicalgia: Secondary | ICD-10-CM

## 2020-05-11 DIAGNOSIS — M62838 Other muscle spasm: Secondary | ICD-10-CM

## 2020-05-11 MED ORDER — MELOXICAM 15 MG PO TABS: 15.0000 mg | ORAL_TABLET | Freq: Every day | ORAL | 0 refills | Status: DC

## 2020-05-11 MED ORDER — CYCLOBENZAPRINE HCL 10 MG PO TABS: 10.0000 mg | ORAL_TABLET | Freq: Every day | ORAL | 0 refills | Status: DC
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
## 2020-05-11 NOTE — ED Triage Notes (Signed)
Pt presents with c/o neck and shoulder pain that radiates to mid back, pt states she has a headache and dizziness, asked how long she has been dizzy and replied that "it just a little nausea "

## 2020-05-11 NOTE — Discharge Instructions (Signed)
Continue conservative management of rest, ice, and gentle stretches Take mobic as needed for pain relief (may cause abdominal discomfort, ulcers, and GI bleeds avoid taking with other NSAIDs) Take cyclobenzaprine at nighttime for symptomatic relief. Avoid driving or operating heavy machinery while using medication. Follow up with PCP if symptoms persist Return or go to the ER if you have any new or worsening symptoms (fever, chills, chest pain, abdominal pain, changes in bowel or bladder habits, pain radiating into lower legs, etc...)

## 2020-07-25 ENCOUNTER — Ambulatory Visit
Admission: EM | Admit: 2020-07-25 | Discharge: 2020-07-25 | Disposition: A | Discharge status: Home / Self Care | Payer: PRIVATE HEALTH INSURANCE | Attending: Internal Medicine | Admitting: Internal Medicine

## 2020-07-25 ENCOUNTER — Other Ambulatory Visit: Payer: Self-pay

## 2020-07-25 DIAGNOSIS — J029 Acute pharyngitis, unspecified: Secondary | ICD-10-CM | POA: Diagnosis present

## 2020-07-25 DIAGNOSIS — Z1152 Encounter for screening for COVID-19: Secondary | ICD-10-CM | POA: Insufficient documentation

## 2020-07-25 LAB — POCT RAPID STREP A (OFFICE): Rapid Strep A Screen: NEGATIVE
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
## 2020-07-25 NOTE — ED Triage Notes (Signed)
Pt presents with  Sore throat that began yesterday, states tonsils are swollen

## 2020-07-25 NOTE — Discharge Instructions (Signed)
Warm salt water gargle Tylenol as needed for pain Cepacol lozenges as needed If your results are significant we will call you with recommendations

## 2020-07-27 LAB — CULTURE, GROUP A STREP (THRC)

## 2020-07-27 LAB — COVID-19, FLU A+B AND RSV
Influenza A, NAA: NOT DETECTED
Influenza B, NAA: NOT DETECTED
RSV, NAA: NOT DETECTED
SARS-CoV-2, NAA: NOT DETECTED

## 2020-09-08 ENCOUNTER — Other Ambulatory Visit: Payer: Self-pay

## 2020-09-08 ENCOUNTER — Ambulatory Visit
Admission: RE | Admit: 2020-09-08 | Discharge: 2020-09-08 | Disposition: A | Discharge status: Home / Self Care | Payer: PRIVATE HEALTH INSURANCE | Source: Ambulatory Visit | Attending: Family Medicine | Admitting: Family Medicine

## 2020-09-08 VITALS — BP 136/87 | HR 87 | Temp 98.0°F | Resp 19

## 2020-09-08 DIAGNOSIS — B349 Viral infection, unspecified: Secondary | ICD-10-CM | POA: Diagnosis not present

## 2020-09-08 DIAGNOSIS — Z1152 Encounter for screening for COVID-19: Secondary | ICD-10-CM | POA: Diagnosis not present

## 2020-09-08 DIAGNOSIS — R059 Cough, unspecified: Secondary | ICD-10-CM

## 2020-09-08 MED ORDER — PROMETHAZINE-DM 6.25-15 MG/5ML PO SYRP: 5.0000 mL | ORAL_SOLUTION | Freq: Four times a day (QID) | ORAL | 0 refills | Status: DC | PRN

## 2020-09-08 NOTE — Discharge Instructions (Signed)
I have sent in cough syrup for you to take. This medication can make you sleepy. Do not drive while taking this medication. ° °Your COVID test is pending. You should self quarantine until the test result is back.   ° °Take Tylenol or ibuprofen as needed for fever or discomfort. Rest and keep yourself hydrated.   ° °Follow-up with your primary care provider if your symptoms are not improving.  ° ° °

## 2020-09-08 NOTE — ED Triage Notes (Signed)
Dry cough that started last night.
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
## 2020-09-12 LAB — COVID-19, FLU A+B NAA
Influenza A, NAA: NOT DETECTED
Influenza B, NAA: NOT DETECTED
SARS-CoV-2, NAA: DETECTED — AB

## 2021-03-09 ENCOUNTER — Other Ambulatory Visit (HOSPITAL_COMMUNITY): Payer: Self-pay | Admitting: Internal Medicine

## 2021-03-09 DIAGNOSIS — Z1231 Encounter for screening mammogram for malignant neoplasm of breast: Secondary | ICD-10-CM

## 2021-03-16 ENCOUNTER — Other Ambulatory Visit: Payer: Self-pay

## 2021-03-16 ENCOUNTER — Ambulatory Visit (HOSPITAL_COMMUNITY)
Admission: RE | Admit: 2021-03-16 | Discharge: 2021-03-16 | Disposition: A | Discharge status: Home / Self Care | Payer: PRIVATE HEALTH INSURANCE | Source: Ambulatory Visit

## 2021-03-16 DIAGNOSIS — Z1231 Encounter for screening mammogram for malignant neoplasm of breast: Secondary | ICD-10-CM

## 2021-03-22 ENCOUNTER — Other Ambulatory Visit (HOSPITAL_COMMUNITY): Payer: Self-pay | Admitting: Internal Medicine

## 2021-03-22 DIAGNOSIS — Z1231 Encounter for screening mammogram for malignant neoplasm of breast: Secondary | ICD-10-CM

## 2021-03-26 ENCOUNTER — Encounter (HOSPITAL_COMMUNITY): Payer: PRIVATE HEALTH INSURANCE

## 2021-03-26 ENCOUNTER — Encounter (HOSPITAL_COMMUNITY): Payer: Self-pay

## 2021-03-30 ENCOUNTER — Ambulatory Visit (HOSPITAL_COMMUNITY)
Admission: RE | Admit: 2021-03-30 | Discharge: 2021-03-30 | Disposition: A | Discharge status: Home / Self Care | Payer: PRIVATE HEALTH INSURANCE | Source: Ambulatory Visit | Attending: Internal Medicine | Admitting: Internal Medicine

## 2021-03-30 ENCOUNTER — Other Ambulatory Visit: Payer: Self-pay

## 2021-03-30 DIAGNOSIS — Z1231 Encounter for screening mammogram for malignant neoplasm of breast: Secondary | ICD-10-CM | POA: Diagnosis not present

## 2022-01-19 ENCOUNTER — Ambulatory Visit
Admission: EM | Admit: 2022-01-19 | Discharge: 2022-01-19 | Disposition: A | Discharge status: Home / Self Care | Payer: BC Managed Care – PPO | Attending: Family Medicine | Admitting: Family Medicine

## 2022-01-19 ENCOUNTER — Ambulatory Visit: Payer: PRIVATE HEALTH INSURANCE

## 2022-01-19 ENCOUNTER — Encounter: Payer: Self-pay | Admitting: Emergency Medicine

## 2022-01-19 DIAGNOSIS — R21 Rash and other nonspecific skin eruption: Secondary | ICD-10-CM

## 2022-01-19 DIAGNOSIS — B350 Tinea barbae and tinea capitis: Secondary | ICD-10-CM

## 2022-01-19 MED ORDER — KETOCONAZOLE 2 % EX CREA: 1.0000 "application " | TOPICAL_CREAM | Freq: Two times a day (BID) | CUTANEOUS | 0 refills | Status: DC | PRN

## 2022-01-19 MED ORDER — KETOCONAZOLE 2 % EX SHAM: 1.0000 "application " | MEDICATED_SHAMPOO | CUTANEOUS | 0 refills | Status: DC

## 2022-01-19 NOTE — ED Provider Notes (Signed)
[facility]    CSN: [id number] Arrival date & time: 01/19/22  4193      History   Chief Complaint Chief Complaint  Patient presents with   Rash    HPI Julie Berry is a 41 y.o. female.   Presenting today with 2-week history of itchy rash, hair loss due to rash the top of her scalp.  The rashes on her scalp and both of her hands.  She denies any new products, new medications, new outdoor exposures.  Husband tried jock itch cream with no relief.  Denies fever, chills, insect bites, chest pain, shortness of breath, nausea, vomiting.   Past Medical History:  Diagnosis Date   Complication of anesthesia    heartrate dropped with anesthesia   Depressed    Diabetes mellitus without complication ([place])    Gallstones    HTN (hypertension)    Hyperglycemia    Leukocytosis    Low back pain    Lump or mass in breast    Malaise and fatigue    Morbid obesity ([place])    Sciatica    Sleep apnea    UTI (urinary tract infection)     Patient Active Problem List   Diagnosis Date Noted   GERD (gastroesophageal reflux disease) 05/27/2014   Knee pain, right 03/21/2014   Difficulty in walking(719.7) 03/21/2014   Weakness of left leg 03/21/2014   OSA (obstructive sleep apnea) 01/25/2014   NEOPLASM OF UNCERTAIN BEHAVIOR OF SKIN 05/01/2009   DYSURIA 04/13/2009   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   LOW HDL 09/30/2008   ADULT SEXUAL ABUSE NEC 06/16/2008   HYPERTENSION 02/26/2007   HYPERGLYCEMIA 02/26/2007   MORBID OBESITY 02/13/2007   DEPRESSION 02/13/2007    Past Surgical History:  Procedure Laterality Date   CHOLECYSTECTOMY  06/22/2012   Procedure: LAPAROSCOPIC CHOLECYSTECTOMY;  Surgeon: Jamesetta So, MD;  Location: [facility];  Service: General;  Laterality: N/A;   ESOPHAGOGASTRODUODENOSCOPY N/A 06/09/2014   Procedure: ESOPHAGOGASTRODUODENOSCOPY (EGD);  Surgeon: Rogene Houston, MD;  Location: [facility];  Service: Endoscopy;  Laterality: N/A;   200-moved to 145 Ann to notify pt    OB History   No obstetric history on file.      Home Medications    Prior to Admission medications   Medication Sig Start Date End Date Taking? Authorizing Provider  insulin regular human CONCENTRATED (HUMULIN R U-500 KWIKPEN) 500 UNIT/ML kwikpen INJECT 45 UNITS AT BREAKFAST AND 35 UNITS AT LUNCH AND DINNER. 08/09/19  Yes Elayne Snare, MD  ketoconazole (NIZORAL) 2 % cream Apply 1 application. topically 2 (two) times daily as needed for irritation. 01/19/22  Yes Volney American, PA-C  ketoconazole (NIZORAL) 2 % shampoo Apply 1 application. topically 2 (two) times a week. 01/21/22  Yes Volney American, PA-C  ondansetron (ZOFRAN) 4 MG tablet Take 1 tablet (4 mg total) by mouth every 8 (eight) hours as needed for nausea or vomiting. 04/05/19  Yes Elayne Snare, MD  Semaglutide,0.25 or 0.'5MG'$ /DOS, (OZEMPIC, 0.25 OR 0.5 MG/DOSE,) 2 MG/1.5ML SOPN Inject 0.5 mg into the skin once a week. 08/10/19  Yes Elayne Snare, MD  acetaminophen (TYLENOL) 500 MG tablet Take 1 tablet (500 mg total) by mouth every 6 (six) hours as needed. 01/18/20   Avegno, Darrelyn Hillock, FNP  Continuous Blood Gluc Sensor (FREESTYLE LIBRE 14 DAY SENSOR) MISC 1 Units by Does not apply route every 14 (fourteen) days. 10/26/18   Elayne Snare, MD  cyclobenzaprine (FLEXERIL) 10 MG  tablet Take 1 tablet (10 mg total) by mouth at bedtime. 05/11/20   Wurst, [place], PA-C  fluconazole (DIFLUCAN) 150 MG tablet Take 1 tablet (150 mg total) by mouth daily. Take 1 tablet by mouth one time. 09/15/19   Elayne Snare, MD  fluvastatin XL (LESCOL XL) 80 MG 24 hr tablet Take 1 tablet (80 mg total) by mouth at bedtime. 04/24/18   Elayne Snare, MD  gabapentin (NEURONTIN) 100 MG capsule Take 1 capsule (100 mg total) by mouth 3 (three) times daily. 01/18/20   Avegno, Darrelyn Hillock, FNP  Insulin Degludec (TRESIBA FLEXTOUCH) 200 UNIT/ML SOPN Inject 64 Units into the skin daily. Inject 64 units under the skin once daily. Patient taking  differently: Inject 65 Units into the skin daily. Inject 65 units under the skin once daily. 06/07/19   Elayne Snare, MD  Insulin Pen Needle (PEN NEEDLES) 33G X 4 MM MISC 1 each by Other route See admin instructions. Use 1 pen needle to inject insulin 4 times daily. 01/27/19   Elayne Snare, MD  meloxicam (MOBIC) 15 MG tablet Take 1 tablet (15 mg total) by mouth daily. 05/11/20   Wurst, [place], PA-C  promethazine-dextromethorphan (PROMETHAZINE-DM) 6.25-15 MG/5ML syrup Take 5 mLs by mouth 4 (four) times daily as needed for cough. 09/08/20   Faustino Congress, NP    Family History Family History  Problem Relation Age of Onset   Diabetes Mother    Rheumatic fever Mother 110   Migraines Father 11   Hypertension Father    Autism Brother    Heart disease Neg Hx     Social History Social History   Tobacco Use   Smoking status: Never   Smokeless tobacco: Never  Vaping Use   Vaping Use: Never used  Substance Use Topics   Alcohol use: No   Drug use: No     Allergies   Amoxicillin, Penicillins, Dilaudid  [hydromorphone hcl], Contrast media [iodinated contrast media], Latex, and Trulicity [dulaglutide]   Review of Systems Review of Systems Per HPI  Physical Exam Triage Vital Signs ED Triage Vitals  Enc Vitals Group     BP 01/19/22 0834 114/73     Pulse Rate 01/19/22 0834 69     Resp 01/19/22 0834 16     Temp 01/19/22 0834 98.4 F (36.9 C)     Temp Source 01/19/22 0834 Oral     SpO2 01/19/22 0834 98 %     Weight --      Height --      Head Circumference --      Peak Flow --      Pain Score 01/19/22 0837 0     Pain Loc --      Pain Edu? --      Excl. in [place]? --    No data found.  Updated Vital Signs BP 114/73 (BP Location: Right Arm)   Pulse 69   Temp 98.4 F (36.9 C) (Oral)   Resp 16   LMP 01/14/2022 (Exact Date)   SpO2 98%   Visual Acuity Right Eye Distance:   Left Eye Distance:   Bilateral Distance:    Right Eye Near:   Left Eye Near:    Bilateral Near:      Physical Exam Vitals and nursing note reviewed.  Constitutional:      Appearance: Normal appearance. She is not ill-appearing.  HENT:     Head: Atraumatic.  Eyes:     Extraocular Movements: Extraocular movements intact.  Conjunctiva/sclera: Conjunctivae normal.  Cardiovascular:     Rate and Rhythm: Normal rate and regular rhythm.     Heart sounds: Normal heart sounds.  Pulmonary:     Effort: Pulmonary effort is normal.     Breath sounds: Normal breath sounds.  Musculoskeletal:        General: Normal range of motion.     Cervical back: Normal range of motion and neck supple.  Skin:    General: Skin is warm and dry.     Findings: Rash present.     Comments: Circular thickened area of scalp superiorly with no hair growth.  Thickened scaly areas to bilateral hands sporadically  Neurological:     Mental Status: She is alert and oriented to person, place, and time.  Psychiatric:        Mood and Affect: Mood normal.        Thought Content: Thought content normal.        Judgment: Judgment normal.     UC Treatments / Results  Labs (all labs ordered are listed, but only abnormal results are displayed) Labs Reviewed - No data to display  EKG   Radiology No results found.  Procedures Procedures (including critical care time)  Medications Ordered in UC Medications - No data to display  Initial Impression / Assessment and Plan / UC Course  I have reviewed the triage vital signs and the nursing notes.  Pertinent labs & imaging results that were available during my care of the patient were reviewed by me and considered in my medical decision making (see chart for details).     Suspect fungal infection, treat with ketoconazole shampoo and cream, tea tree oil and close PCP follow-up.  Return for worsening symptoms.  Final Clinical Impressions(s) / UC Diagnoses   Final diagnoses:  Tinea capitis  Rash   Discharge Instructions   None    ED Prescriptions      Medication Sig Dispense Auth. Provider   ketoconazole (NIZORAL) 2 % shampoo Apply 1 application. topically 2 (two) times a week. 120 mL Volney American, PA-C   ketoconazole (NIZORAL) 2 % cream Apply 1 application. topically 2 (two) times daily as needed for irritation. 80 g Volney American, [place]      PDMP not reviewed this encounter.   Volney American, [place] 01/19/22 1153

## 2022-01-19 NOTE — ED Triage Notes (Signed)
Patient c/o rash x 2 weeks.   Patient endorses itchiness. Patient endorses hair loss due to rash.   Patient endorses rash on bilateral hands and scalp.   Patient endorses fatigue.   Patient has used " jock itch cream" with no relief of symptoms.

## 2022-02-16 ENCOUNTER — Other Ambulatory Visit: Payer: Self-pay | Admitting: Family Medicine

## 2022-02-18 NOTE — Telephone Encounter (Signed)
Requested Prescriptions  Pending Prescriptions Disp Refills  . ketoconazole (NIZORAL) 2 % shampoo [Pharmacy Med Name: KETOCONAZOLE 2% SHAMPOO] 120 mL 0    Sig: APPLY 1 APPLICATION. TOPICALLY 2 (TWO) TIMES A WEEK.     There is no refill protocol information for this order

## 2022-05-11 ENCOUNTER — Other Ambulatory Visit: Payer: Self-pay | Admitting: Endocrinology

## 2022-06-29 ENCOUNTER — Encounter (INDEPENDENT_AMBULATORY_CARE_PROVIDER_SITE_OTHER): Payer: Self-pay | Admitting: Gastroenterology

## 2022-07-13 ENCOUNTER — Other Ambulatory Visit: Payer: Self-pay | Admitting: Endocrinology

## 2023-02-26 ENCOUNTER — Telehealth: Payer: Self-pay | Admitting: Nutrition

## 2023-02-28 ENCOUNTER — Ambulatory Visit
Admission: EM | Admit: 2023-02-28 | Discharge: 2023-02-28 | Disposition: A | Discharge status: Home / Self Care | Payer: BC Managed Care – PPO | Attending: Family Medicine | Admitting: Family Medicine

## 2023-02-28 DIAGNOSIS — J069 Acute upper respiratory infection, unspecified: Secondary | ICD-10-CM | POA: Diagnosis not present

## 2023-02-28 DIAGNOSIS — R52 Pain, unspecified: Secondary | ICD-10-CM | POA: Insufficient documentation

## 2023-02-28 DIAGNOSIS — Z1152 Encounter for screening for COVID-19: Secondary | ICD-10-CM | POA: Insufficient documentation

## 2023-02-28 MED ORDER — FLUTICASONE PROPIONATE 50 MCG/ACT NA SUSP: 1.0000 | Freq: Two times a day (BID) | NASAL | 2 refills | Status: DC

## 2023-02-28 MED ORDER — BENZONATATE 100 MG PO CAPS: 100.0000 mg | ORAL_CAPSULE | Freq: Three times a day (TID) | ORAL | 0 refills | Status: DC

## 2023-02-28 NOTE — ED Provider Notes (Signed)
[facility]    CSN: [id number] Arrival date & time: 02/28/23  0815      History   Chief Complaint No chief complaint on file.   HPI Julie Berry is a 42 y.o. female.   Patient presenting today with 1 day history of headache, lightheadedness, nausea, body aches, sinus drainage, fatigue.  Denies fever, chills, chest pain, shortness of breath, abdominal pain, nausea vomiting or diarrhea.  So far not trying anything over-the-counter for symptoms.  Concerned regarding COVID as there is outbreaks at work.  No known history of chronic pulmonary disease.    Past Medical History:  Diagnosis Date   Complication of anesthesia    heartrate dropped with anesthesia   Depressed    Diabetes mellitus without complication ([facility])    Gallstones    HTN (hypertension)    Hyperglycemia    Leukocytosis    Low back pain    Lump or mass in breast    Malaise and fatigue    Morbid obesity ([facility])    Sciatica    Sleep apnea    UTI (urinary tract infection)     Patient Active Problem List   Diagnosis Date Noted   GERD (gastroesophageal reflux disease) 05/27/2014   Knee pain, right 03/21/2014   Difficulty in walking(719.7) 03/21/2014   Weakness of left leg 03/21/2014   OSA (obstructive sleep apnea) 01/25/2014   NEOPLASM OF UNCERTAIN BEHAVIOR OF SKIN 05/01/2009   DYSURIA 04/13/2009   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   LOW HDL 09/30/2008   ADULT SEXUAL ABUSE NEC 06/16/2008   HYPERTENSION 02/26/2007   HYPERGLYCEMIA 02/26/2007   MORBID OBESITY 02/13/2007   DEPRESSION 02/13/2007    Past Surgical History:  Procedure Laterality Date   CHOLECYSTECTOMY  06/22/2012   Procedure: LAPAROSCOPIC CHOLECYSTECTOMY;  Surgeon: Dalia Heading, MD;  Location: [facility];  Service: General;  Laterality: N/A;   ESOPHAGOGASTRODUODENOSCOPY N/A 06/09/2014   Procedure: ESOPHAGOGASTRODUODENOSCOPY (EGD);  Surgeon: Malissa Hippo, MD;  Location: [facility];  Service: Endoscopy;   Laterality: N/A;  200-moved to 145 Ann to notify pt    OB History   No obstetric history on file.      Home Medications    Prior to Admission medications   Medication Sig Start Date End Date Taking? Authorizing Provider  benzonatate (TESSALON) 100 MG capsule Take 1 capsule (100 mg total) by mouth every 8 (eight) hours. 02/28/23  Yes Particia Nearing, PA-C  fluticasone [facility]) 50 MCG/ACT nasal spray Place 1 spray into both nostrils 2 (two) times daily. 02/28/23  Yes Particia Nearing, PA-C  acetaminophen (TYLENOL) 500 MG tablet Take 1 tablet (500 mg total) by mouth every 6 (six) hours as needed. 01/18/20   Avegno, Zachery Dakins, FNP  Continuous Blood Gluc Sensor (FREESTYLE LIBRE 14 DAY SENSOR) MISC 1 Units by Does not apply route every 14 (fourteen) days. 10/26/18   Reather Littler, MD  cyclobenzaprine (FLEXERIL) 10 MG tablet Take 1 tablet (10 mg total) by mouth at bedtime. 05/11/20   Wurst, [place], PA-C  fluconazole (DIFLUCAN) 150 MG tablet Take 1 tablet (150 mg total) by mouth daily. Take 1 tablet by mouth one time. 09/15/19   Reather Littler, MD  fluvastatin XL (LESCOL XL) 80 MG 24 hr tablet Take 1 tablet (80 mg total) by mouth at bedtime. 04/24/18   Reather Littler, MD  gabapentin (NEURONTIN) 100 MG capsule Take 1 capsule (100 mg total) by mouth 3 (three) times daily. 01/18/20   Avegno,  Zachery Dakins, FNP  Insulin Degludec (TRESIBA FLEXTOUCH) 200 UNIT/ML SOPN Inject 64 Units into the skin daily. Inject 64 units under the skin once daily. Patient taking differently: Inject 65 Units into the skin daily. Inject 65 units under the skin once daily. 06/07/19   Reather Littler, MD  Insulin Pen Needle (PEN NEEDLES) 33G X 4 MM MISC 1 each by Other route See admin instructions. Use 1 pen needle to inject insulin 4 times daily. 01/27/19   Reather Littler, MD  insulin regular human CONCENTRATED (HUMULIN R U-500 KWIKPEN) 500 UNIT/ML kwikpen INJECT 45 UNITS AT BREAKFAST AND 35 UNITS AT LUNCH AND DINNER. 08/09/19   Reather Littler,  MD  ketoconazole (NIZORAL) 2 % cream Apply 1 application. topically 2 (two) times daily as needed for irritation. 01/19/22   Particia Nearing, PA-C  ketoconazole (NIZORAL) 2 % shampoo Apply 1 application. topically 2 (two) times a week. 01/21/22   Particia Nearing, PA-C  meloxicam (MOBIC) 15 MG tablet Take 1 tablet (15 mg total) by mouth daily. 05/11/20   Wurst, [place], PA-C  ondansetron (ZOFRAN) 4 MG tablet Take 1 tablet (4 mg total) by mouth every 8 (eight) hours as needed for nausea or vomiting. 04/05/19   Reather Littler, MD  promethazine-dextromethorphan (PROMETHAZINE-DM) 6.25-15 MG/5ML syrup Take 5 mLs by mouth 4 (four) times daily as needed for cough. 09/08/20   Moshe Cipro, NP  Semaglutide,0.25 or 0.5MG /DOS, (OZEMPIC, 0.25 OR 0.5 MG/DOSE,) 2 MG/1.5ML SOPN Inject 0.5 mg into the skin once a week. 08/10/19   Reather Littler, MD    Family History Family History  Problem Relation Age of Onset   Diabetes Mother    Rheumatic fever Mother 84   Migraines Father 1   Hypertension Father    Autism Brother    Heart disease Neg Hx     Social History Social History   Tobacco Use   Smoking status: Never   Smokeless tobacco: Never  Vaping Use   Vaping status: Never Used  Substance Use Topics   Alcohol use: No   Drug use: No     Allergies   Amoxicillin, Penicillins, Dilaudid  [hydromorphone hcl], Contrast media [iodinated contrast media], Latex, and Trulicity [dulaglutide]   Review of Systems Review of Systems Per HPI  Physical Exam Triage Vital Signs ED Triage Vitals  Encounter Vitals Group     BP 02/28/23 0823 111/68     Systolic BP Percentile --      Diastolic BP Percentile --      Pulse --      Resp 02/28/23 0823 14     Temp 02/28/23 0823 98 F (36.7 C)     Temp Source 02/28/23 0823 Oral     SpO2 02/28/23 0823 96 %     Weight --      Height --      Head Circumference --      Peak Flow --      Pain Score 02/28/23 0826 8     Pain Loc --      Pain  Education --      Exclude from Growth Chart --    No data found.  Updated Vital Signs BP 111/68 (BP Location: Right Arm)   Temp 98 F (36.7 C) (Oral)   Resp 14   LMP 02/22/2023 (Exact Date)   SpO2 96%   Visual Acuity Right Eye Distance:   Left Eye Distance:   Bilateral Distance:    Right Eye Near:   Left Eye  Near:    Bilateral Near:     Physical Exam Vitals and nursing note reviewed.  Constitutional:      Appearance: Normal appearance. She is not ill-appearing.  HENT:     Head: Atraumatic.     Right Ear: Tympanic membrane and external ear normal.     Left Ear: Tympanic membrane and external ear normal.     Nose: Nose normal.     Mouth/Throat:     Mouth: Mucous membranes are moist.     Pharynx: Posterior oropharyngeal erythema present.  Eyes:     Extraocular Movements: Extraocular movements intact.     Conjunctiva/sclera: Conjunctivae normal.  Cardiovascular:     Rate and Rhythm: Normal rate and regular rhythm.     Heart sounds: Normal heart sounds.  Pulmonary:     Effort: Pulmonary effort is normal.     Breath sounds: Normal breath sounds. No wheezing or rales.  Musculoskeletal:        General: Normal range of motion.     Cervical back: Normal range of motion and neck supple.  Skin:    General: Skin is warm and dry.  Neurological:     Mental Status: She is alert and oriented to person, place, and time.  Psychiatric:        Mood and Affect: Mood normal.        Thought Content: Thought content normal.        Judgment: Judgment normal.      UC Treatments / Results  Labs (all labs ordered are listed, but only abnormal results are displayed) Labs Reviewed  SARS CORONAVIRUS 2 (TAT 6-24 HRS)    EKG   Radiology No results found.  Procedures Procedures (including critical care time)  Medications Ordered in UC Medications - No data to display  Initial Impression / Assessment and Plan / UC Course  I have reviewed the triage vital signs and the  nursing notes.  Pertinent labs & imaging results that were available during my care of the patient were reviewed by me and considered in my medical decision making (see chart for details).     Overall vitals and exam reassuring and suggestive of a viral upper respiratory infection.  COVID testing pending, treat with Flonase, Tessalon, supportive over-the-counter medications and home care.  Work note given.  Return for worsening symptoms.  Final Clinical Impressions(s) / UC Diagnoses   Final diagnoses:  Viral URI  Generalized body aches     Discharge Instructions      We have tested you for COVID today and should have those results back in the morning.  Someone will call if your results are positive.  I have sent over Flonase to help with your sinus drainage and Tessalon Perles in case you start coughing.  These will not impact her blood sugars.  You may also try over-the-counter remedies such as plain Mucinex or Sudafed or a cough syrup that is diabetic friendly.  Ibuprofen and Tylenol as needed for the body aches and headache.    ED Prescriptions     Medication Sig Dispense Auth. Provider   fluticasone (FLONASE) 50 MCG/ACT nasal spray Place 1 spray into both nostrils 2 (two) times daily. 16 g Particia Nearing, PA-C   benzonatate (TESSALON) 100 MG capsule Take 1 capsule (100 mg total) by mouth every 8 (eight) hours. 21 capsule Particia Nearing, [place]      PDMP not reviewed this encounter.   Particia Nearing, [place] 02/28/23 1934

## 2023-02-28 NOTE — Discharge Instructions (Signed)
We have tested you for COVID today and should have those results back in the morning.  Someone will call if your results are positive.  I have sent over Flonase to help with your sinus drainage and Tessalon Perles in case you start coughing.  These will not impact her blood sugars.  You may also try over-the-counter remedies such as plain Mucinex or Sudafed or a cough syrup that is diabetic friendly.  Ibuprofen and Tylenol as needed for the body aches and headache.

## 2023-02-28 NOTE — ED Triage Notes (Signed)
Pt c/o headache,dizziness, lightheadedness, nausea, body aches. Sinus drainage. Fatigue. 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

## 2023-03-01 LAB — SARS CORONAVIRUS 2 (TAT 6-24 HRS): SARS Coronavirus 2: NEGATIVE

## 2023-03-13 ENCOUNTER — Ambulatory Visit
Admission: EM | Admit: 2023-03-13 | Discharge: 2023-03-13 | Disposition: A | Discharge status: Home / Self Care | Payer: BC Managed Care – PPO

## 2023-03-13 ENCOUNTER — Encounter: Payer: Self-pay | Admitting: Emergency Medicine

## 2023-03-13 DIAGNOSIS — L03012 Cellulitis of left finger: Secondary | ICD-10-CM

## 2023-03-13 MED ORDER — DOXYCYCLINE HYCLATE 100 MG PO CAPS: 100.0000 mg | ORAL_CAPSULE | Freq: Two times a day (BID) | ORAL | 0 refills | Status: DC

## 2023-03-13 MED ORDER — FLUCONAZOLE 150 MG PO TABS: 150.0000 mg | ORAL_TABLET | Freq: Every day | ORAL | 0 refills | Status: DC

## 2023-03-13 NOTE — Discharge Instructions (Signed)
Soak your finger in warm Epson salts bath for 15 minutes 3-4 times a day for 4 days If the redness and swelling get worse, make sure to be seen again

## 2023-03-13 NOTE — ED Triage Notes (Signed)
Left ring finger swollen and painful 2 to 3 days.  States pain shoots up arm.  Currently taking Cipro for yeast.

## 2023-03-13 NOTE — ED Provider Notes (Signed)
[facility]    CSN: [id number] Arrival date & time: 03/13/23  1916      History   Chief Complaint No chief complaint on file.   HPI Julie Berry is a 42 y.o. female who presents with L lateral ring distal finger x 2 days and noticed redness on side. Has not injured it or had nails done or pulled a hand nail off this area. Pain radiates down this finger to her palm     Past Medical History:  Diagnosis Date   Complication of anesthesia    heartrate dropped with anesthesia   Depressed    Diabetes mellitus without complication (HCC)    Gallstones    HTN (hypertension)    Hyperglycemia    Leukocytosis    Low back pain    Lump or mass in breast    Malaise and fatigue    Morbid obesity (HCC)    Sciatica    Sleep apnea    UTI (urinary tract infection)     Patient Active Problem List   Diagnosis Date Noted   GERD (gastroesophageal reflux disease) 05/27/2014   Knee pain, right 03/21/2014   Difficulty in walking(719.7) 03/21/2014   Weakness of left leg 03/21/2014   OSA (obstructive sleep apnea) 01/25/2014   NEOPLASM OF UNCERTAIN BEHAVIOR OF SKIN 05/01/2009   DYSURIA 04/13/2009   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   LOW HDL 09/30/2008   ADULT SEXUAL ABUSE NEC 06/16/2008   HYPERTENSION 02/26/2007   HYPERGLYCEMIA 02/26/2007   MORBID OBESITY 02/13/2007   DEPRESSION 02/13/2007    Past Surgical History:  Procedure Laterality Date   CHOLECYSTECTOMY  06/22/2012   Procedure: LAPAROSCOPIC CHOLECYSTECTOMY;  Surgeon: Dalia Heading, MD;  Location: [facility];  Service: General;  Laterality: N/A;   ESOPHAGOGASTRODUODENOSCOPY N/A 06/09/2014   Procedure: ESOPHAGOGASTRODUODENOSCOPY (EGD);  Surgeon: Malissa Hippo, MD;  Location: [facility];  Service: Endoscopy;  Laterality: N/A;  200-moved to 145 Ann to notify pt    OB History   No obstetric history on file.      Home Medications    Prior to Admission medications   Medication Sig  Start Date End Date Taking? Authorizing Provider  ciprofloxacin (CIPRO) 500 MG tablet Take 500 mg by mouth 2 (two) times daily.   Yes [provider]  doxycycline (VIBRAMYCIN) 100 MG capsule Take 1 capsule (100 mg total) by mouth 2 (two) times daily. 03/13/23  Yes Rodriguez-Southworth, Nettie Elm, PA-C  acetaminophen (TYLENOL) 500 MG tablet Take 1 tablet (500 mg total) by mouth every 6 (six) hours as needed. 01/18/20   Avegno, Zachery Dakins, FNP  Continuous Blood Gluc Sensor (FREESTYLE LIBRE 14 DAY SENSOR) MISC 1 Units by Does not apply route every 14 (fourteen) days. 10/26/18   Reather Littler, MD  cyclobenzaprine (FLEXERIL) 10 MG tablet Take 1 tablet (10 mg total) by mouth at bedtime. 05/11/20   Wurst, [place], PA-C  fluconazole (DIFLUCAN) 150 MG tablet Take 1 tablet (150 mg total) by mouth daily. Take 1 tablet by mouth one time. 03/13/23   Rodriguez-Southworth, Nettie Elm, PA-C  fluticasone (FLONASE) 50 MCG/ACT nasal spray Place 1 spray into both nostrils 2 (two) times daily. 02/28/23   Particia Nearing, PA-C  fluvastatin XL (LESCOL XL) 80 MG 24 hr tablet Take 1 tablet (80 mg total) by mouth at bedtime. 04/24/18   Reather Littler, MD  gabapentin (NEURONTIN) 100 MG capsule Take 1 capsule (100 mg total) by mouth 3 (three) times daily. 01/18/20  Avegno, Zachery Dakins, FNP  Insulin Degludec (TRESIBA FLEXTOUCH) 200 UNIT/ML SOPN Inject 64 Units into the skin daily. Inject 64 units under the skin once daily. Patient taking differently: Inject 65 Units into the skin daily. Inject 65 units under the skin once daily. 06/07/19   Reather Littler, MD  Insulin Pen Needle (PEN NEEDLES) 33G X 4 MM MISC 1 each by Other route See admin instructions. Use 1 pen needle to inject insulin 4 times daily. 01/27/19   Reather Littler, MD  insulin regular human CONCENTRATED (HUMULIN R U-500 KWIKPEN) 500 UNIT/ML kwikpen INJECT 45 UNITS AT BREAKFAST AND 35 UNITS AT LUNCH AND DINNER. 08/09/19   Reather Littler, MD  ketoconazole (NIZORAL) 2 % cream Apply 1  application. topically 2 (two) times daily as needed for irritation. 01/19/22   Particia Nearing, PA-C  ketoconazole (NIZORAL) 2 % shampoo Apply 1 application. topically 2 (two) times a week. 01/21/22   Particia Nearing, PA-C  meloxicam (MOBIC) 15 MG tablet Take 1 tablet (15 mg total) by mouth daily. 05/11/20   Wurst, [place], PA-C  ondansetron (ZOFRAN) 4 MG tablet Take 1 tablet (4 mg total) by mouth every 8 (eight) hours as needed for nausea or vomiting. 04/05/19   Reather Littler, MD  promethazine-dextromethorphan (PROMETHAZINE-DM) 6.25-15 MG/5ML syrup Take 5 mLs by mouth 4 (four) times daily as needed for cough. 09/08/20   Moshe Cipro, NP  Semaglutide,0.25 or 0.5MG /DOS, (OZEMPIC, 0.25 OR 0.5 MG/DOSE,) 2 MG/1.5ML SOPN Inject 0.5 mg into the skin once a week. 08/10/19   Reather Littler, MD    Family History Family History  Problem Relation Age of Onset   Diabetes Mother    Rheumatic fever Mother 9   Migraines Father 90   Hypertension Father    Autism Brother    Heart disease Neg Hx     Social History Social History   Tobacco Use   Smoking status: Never   Smokeless tobacco: Never  Vaping Use   Vaping status: Never Used  Substance Use Topics   Alcohol use: No   Drug use: No     Allergies   Amoxicillin, Penicillins, Dilaudid  [hydromorphone hcl], Peanut (diagnostic), Contrast media [iodinated contrast media], Latex, and Trulicity [dulaglutide]   Review of Systems Review of Systems As noted in HPI  Physical Exam Triage Vital Signs ED Triage Vitals [03/13/23 1921]  Encounter Vitals Group     BP 109/73     Systolic BP Percentile      Diastolic BP Percentile      Pulse Rate 94     Resp 18     Temp 98 F (36.7 C)     Temp Source Oral     SpO2 96 %     Weight      Height      Head Circumference      Peak Flow      Pain Score      Pain Loc      Pain Education      Exclude from Growth Chart    No data found.  Updated Vital Signs BP 109/73 (BP Location:  Right Arm)   Pulse 94   Temp 98 F (36.7 C) (Oral)   Resp 18   LMP 02/22/2023 (Exact Date)   SpO2 96%   Visual Acuity Right Eye Distance:   Left Eye Distance:   Bilateral Distance:    Right Eye Near:   Left Eye Near:    Bilateral Near:  Physical Exam Vitals and nursing note reviewed.  Constitutional:      General: She is not in acute distress.    Appearance: She is obese. She is not toxic-appearing.  Eyes:     General: No scleral icterus.    Conjunctiva/sclera: Conjunctivae normal.  Cardiovascular:     Pulses: Normal pulses.  Pulmonary:     Effort: Pulmonary effort is normal.  Musculoskeletal:        General: Normal range of motion.     Cervical back: Neck supple.     Comments: L RING FINGER- distal lateral area with erythema, mild swelling and warmth, but no streaking down her finger.   Skin:    General: Skin is warm and dry.     Capillary Refill: Capillary refill takes less than 2 seconds.     Findings: Erythema present. No bruising, lesion or rash.  Neurological:     Mental Status: She is alert and oriented to person, place, and time.     Gait: Gait normal.  Psychiatric:        Behavior: Behavior normal.        Thought Content: Thought content normal.        Judgment: Judgment normal.      UC Treatments / Results  Labs (all labs ordered are listed, but only abnormal results are displayed) Labs Reviewed - No data to display  EKG   Radiology No results found.  Procedures Procedures (including critical care time)  Medications Ordered in UC Medications - No data to display  Initial Impression / Assessment and Plan / UC Course  I have reviewed the triage vital signs and the nursing notes.  Paronychia L ring finger  I placed her on Doxy and Diflucan as noted. See instructions     Final Clinical Impressions(s) / UC Diagnoses   Final diagnoses:  Paronychia, finger, left     Discharge Instructions      Soak your finger in warm Epson  salts bath for 15 minutes 3-4 times a day for 4 days If the redness and swelling get worse, make sure to be seen again     ED Prescriptions     Medication Sig Dispense Auth. Provider   doxycycline (VIBRAMYCIN) 100 MG capsule Take 1 capsule (100 mg total) by mouth 2 (two) times daily. 20 capsule Rodriguez-Southworth, Nettie Elm, PA-C   fluconazole (DIFLUCAN) 150 MG tablet Take 1 tablet (150 mg total) by mouth daily. Take 1 tablet by mouth one time. 2 tablet Rodriguez-Southworth, Nettie Elm, PA-C      PDMP not reviewed this encounter.   Garey Ham, [place] 03/13/23 1952

## 2023-03-13 NOTE — ED Notes (Signed)
No known injury. States finger is hot to the touch and red.

## 2023-03-22 ENCOUNTER — Ambulatory Visit
Admission: EM | Admit: 2023-03-22 | Discharge: 2023-03-22 | Disposition: A | Discharge status: Home / Self Care | Payer: BC Managed Care – PPO | Attending: Nurse Practitioner | Admitting: Nurse Practitioner

## 2023-03-22 ENCOUNTER — Telehealth: Payer: Self-pay

## 2023-03-22 DIAGNOSIS — H1013 Acute atopic conjunctivitis, bilateral: Secondary | ICD-10-CM

## 2023-03-22 MED ORDER — PATADAY 0.7 % OP SOLN: OPHTHALMIC | 0 refills | Status: DC

## 2023-03-22 NOTE — Discharge Instructions (Signed)
Use eyedrops as prescribed.  Cool compresses to the eyes to help with pain or swelling. May use over-the-counter Visine or Clear Eyes to help keep the eyes moist and lubricated. Strict handwashing when applying medication.  Avoid rubbing or manipulating the eyes while symptoms persist. Discard any eye make-up that were using prior to your symptoms starting. Follow-up in this clinic or with your eye doctor if you experience worsening symptoms, or new symptoms of drainage, redness, or swelling. Follow-up as needed.

## 2023-03-22 NOTE — ED Provider Notes (Signed)
[facility]    CSN: [id number] Arrival date & time: 03/22/23  1308      History   Chief Complaint No chief complaint on file.   HPI Julie Berry is a 42 y.o. female.   The history is provided by the Julie Berry.   The Julie Berry presents for complaints of irritation and itching in the left eye.  Julie Berry states symptoms have been present for approximately 1 week.  Julie Berry states that Julie Berry got some make-up in her eye and that is when symptoms started.  Julie Berry states over the past 24 hours, Julie Berry also has the same or similar symptoms in the right eye, but symptoms are not as severe.  Julie Berry complains of blurred vision and mild light sensitivity.  Julie Berry also complains of the eyes feeling "dry".  Julie Berry denies fever, chills, purulent drainage from the eyes, loss of vision, feeling of a foreign object in the eye, headache, or dizziness.  Julie Berry reports that Julie Berry did use compresses to the eyes with some relief, but symptoms returned.  Julie Berry states that Julie Berry does not have a history of seasonal allergies.  Julie Berry states that Julie Berry wears eyeglasses.  Past Medical History:  Diagnosis Date   Complication of anesthesia    heartrate dropped with anesthesia   Depressed    Diabetes mellitus without complication (HCC)    Gallstones    HTN (hypertension)    Hyperglycemia    Leukocytosis    Low back pain    Lump or mass in breast    Malaise and fatigue    Morbid obesity (HCC)    Sciatica    Sleep apnea    UTI (urinary tract infection)     Julie Berry Active Problem List   Diagnosis Date Noted   GERD (gastroesophageal reflux disease) 05/27/2014   Knee pain, right 03/21/2014   Difficulty in walking(719.7) 03/21/2014   Weakness of left leg 03/21/2014   OSA (obstructive sleep apnea) 01/25/2014   NEOPLASM OF UNCERTAIN BEHAVIOR OF SKIN 05/01/2009   DYSURIA 04/13/2009   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   LOW HDL 09/30/2008   ADULT SEXUAL ABUSE NEC 06/16/2008   HYPERTENSION  02/26/2007   HYPERGLYCEMIA 02/26/2007   MORBID OBESITY 02/13/2007   DEPRESSION 02/13/2007    Past Surgical History:  Procedure Laterality Date   CHOLECYSTECTOMY  06/22/2012   Procedure: LAPAROSCOPIC CHOLECYSTECTOMY;  Surgeon: Dalia Heading, MD;  Location: [facility];  Service: General;  Laterality: N/A;   ESOPHAGOGASTRODUODENOSCOPY N/A 06/09/2014   Procedure: ESOPHAGOGASTRODUODENOSCOPY (EGD);  Surgeon: Malissa Hippo, MD;  Location: [facility];  Service: Endoscopy;  Laterality: N/A;  200-moved to 145 Ann to notify pt    OB History   No obstetric history on file.      Home Medications    Prior to Admission medications   Medication Sig Start Date End Date Taking? Authorizing Provider  Olopatadine HCl (PATADAY) 0.7 % SOLN Apply 1 drop to both eyes twice daily for 7 days. 03/22/23  Yes Griffin Dewilde-Warren, Sadie Haber, NP  acetaminophen (TYLENOL) 500 MG tablet Take 1 tablet (500 mg total) by mouth every 6 (six) hours as needed. 01/18/20   Avegno, Zachery Dakins, FNP  ciprofloxacin (CIPRO) 500 MG tablet Take 500 mg by mouth 2 (two) times daily.    [provider]  Continuous Blood Gluc Sensor (FREESTYLE LIBRE 14 DAY SENSOR) MISC 1 Units by Does not apply route every 14 (fourteen) days. 10/26/18   Reather Littler, MD  cyclobenzaprine (FLEXERIL) 10 MG  tablet Take 1 tablet (10 mg total) by mouth at bedtime. 05/11/20   Wurst, [place], PA-C  doxycycline (VIBRAMYCIN) 100 MG capsule Take 1 capsule (100 mg total) by mouth 2 (two) times daily. 03/13/23   Rodriguez-Southworth, Nettie Elm, PA-C  fluconazole (DIFLUCAN) 150 MG tablet Take 1 tablet (150 mg total) by mouth daily. Take 1 tablet by mouth one time. 03/13/23   Rodriguez-Southworth, Nettie Elm, PA-C  fluticasone (FLONASE) 50 MCG/ACT nasal spray Place 1 spray into both nostrils 2 (two) times daily. 02/28/23   Particia Nearing, PA-C  fluvastatin XL (LESCOL XL) 80 MG 24 hr tablet Take 1 tablet (80 mg total) by mouth at bedtime. 04/24/18   Reather Littler, MD   gabapentin (NEURONTIN) 100 MG capsule Take 1 capsule (100 mg total) by mouth 3 (three) times daily. 01/18/20   Avegno, Zachery Dakins, FNP  Insulin Degludec (TRESIBA FLEXTOUCH) 200 UNIT/ML SOPN Inject 64 Units into the skin daily. Inject 64 units under the skin once daily. Julie Berry taking differently: Inject 65 Units into the skin daily. Inject 65 units under the skin once daily. 06/07/19   Reather Littler, MD  Insulin Pen Needle (PEN NEEDLES) 33G X 4 MM MISC 1 each by Other route See admin instructions. Use 1 pen needle to inject insulin 4 times daily. 01/27/19   Reather Littler, MD  insulin regular human CONCENTRATED (HUMULIN R U-500 KWIKPEN) 500 UNIT/ML kwikpen INJECT 45 UNITS AT BREAKFAST AND 35 UNITS AT LUNCH AND DINNER. 08/09/19   Reather Littler, MD  ketoconazole (NIZORAL) 2 % cream Apply 1 application. topically 2 (two) times daily as needed for irritation. 01/19/22   Particia Nearing, PA-C  ketoconazole (NIZORAL) 2 % shampoo Apply 1 application. topically 2 (two) times a week. 01/21/22   Particia Nearing, PA-C  meloxicam (MOBIC) 15 MG tablet Take 1 tablet (15 mg total) by mouth daily. 05/11/20   Wurst, [place], PA-C  ondansetron (ZOFRAN) 4 MG tablet Take 1 tablet (4 mg total) by mouth every 8 (eight) hours as needed for nausea or vomiting. 04/05/19   Reather Littler, MD  promethazine-dextromethorphan (PROMETHAZINE-DM) 6.25-15 MG/5ML syrup Take 5 mLs by mouth 4 (four) times daily as needed for cough. 09/08/20   Moshe Cipro, NP  Semaglutide,0.25 or 0.5MG /DOS, (OZEMPIC, 0.25 OR 0.5 MG/DOSE,) 2 MG/1.5ML SOPN Inject 0.5 mg into the skin once a week. 08/10/19   Reather Littler, MD    Family History Family History  Problem Relation Age of Onset   Diabetes Mother    Rheumatic fever Mother 67   Migraines Father 24   Hypertension Father    Autism Brother    Heart disease Neg Hx     Social History Social History   Tobacco Use   Smoking status: Never   Smokeless tobacco: Never  Vaping Use   Vaping  status: Never Used  Substance Use Topics   Alcohol use: No   Drug use: No     Allergies   Amoxicillin, Penicillins, Dilaudid  [hydromorphone hcl], Meloxicam, Peanut (diagnostic), Contrast media [iodinated contrast media], Latex, and Trulicity [dulaglutide]   Review of Systems Review of Systems Per HPI  Physical Exam Triage Vital Signs ED Triage Vitals  Encounter Vitals Group     BP 03/22/23 0934 106/66     Systolic BP Percentile --      Diastolic BP Percentile --      Pulse Rate 03/22/23 0934 77     Resp 03/22/23 0934 12     Temp 03/22/23 0934 98.2 F (36.8 C)  Temp Source 03/22/23 0934 Oral     SpO2 03/22/23 0934 96 %     Weight --      Height --      Head Circumference --      Peak Flow --      Pain Score 03/22/23 0937 5     Pain Loc --      Pain Education --      Exclude from Growth Chart --    No data found.  Updated Vital Signs BP 106/66 (BP Location: Right Arm)   Pulse 77   Temp 98.2 F (36.8 C) (Oral)   Resp 12   LMP 02/22/2023 (Exact Date)   SpO2 96%   Visual Acuity Right Eye Distance:   Left Eye Distance:   Bilateral Distance:    Right Eye Near: R Near: 20/20 Left Eye Near:  L Near: 20/20 Bilateral Near:  20/20  Physical Exam Vitals and nursing note reviewed.  Constitutional:      General: Julie Berry is not in acute distress.    Appearance: Normal appearance.  HENT:     Head: Normocephalic.     Right Ear: Tympanic membrane, ear canal and external ear normal.     Left Ear: Tympanic membrane, ear canal and external ear normal.     Nose: Nose normal.     Mouth/Throat:     Mouth: Mucous membranes are moist.  Eyes:     General: Lids are normal. Vision grossly intact. No visual field deficit.       Right eye: No foreign body, discharge or hordeolum.        Left eye: No foreign body, discharge or hordeolum.     Extraocular Movements: Extraocular movements intact.     Right eye: Normal extraocular motion and no nystagmus.     Left eye: Normal  extraocular motion and no nystagmus.     Conjunctiva/sclera: Conjunctivae normal.     Right eye: Right conjunctiva is not injected. No chemosis, exudate or hemorrhage.    Left eye: Left conjunctiva is not injected. No chemosis, exudate or hemorrhage.    Pupils: Pupils are equal, round, and reactive to light.  Cardiovascular:     Rate and Rhythm: Normal rate and regular rhythm.     Pulses: Normal pulses.     Heart sounds: Normal heart sounds.  Pulmonary:     Effort: Pulmonary effort is normal. No respiratory distress.     Breath sounds: Normal breath sounds. No stridor. No wheezing, rhonchi or rales.  Abdominal:     General: Bowel sounds are normal.     Palpations: Abdomen is soft.     Tenderness: There is no abdominal tenderness.  Musculoskeletal:     Cervical back: Normal range of motion.  Skin:    General: Skin is warm and dry.  Neurological:     General: No focal deficit present.     Mental Status: Julie Berry is alert and oriented to person, place, and time.  Psychiatric:        Mood and Affect: Mood normal.        Behavior: Behavior normal.      UC Treatments / Results  Labs (all labs ordered are listed, but only abnormal results are displayed) Labs Reviewed - No data to display  EKG   Radiology No results found.  Procedures Procedures (including critical care time)  Medications Ordered in UC Medications - No data to display  Initial Impression / Assessment and Plan / UC Course  I have reviewed the triage vital signs and the nursing notes.  Pertinent labs & imaging results that were available during my care of the Julie Berry were reviewed by me and considered in my medical decision making (see chart for details).  The Julie Berry is well-appearing, Julie Berry is in no acute distress, vital signs are stable.  On exam, Julie Berry does not have any erythema or injection of the conjunctiva, nor does Julie Berry have any obvious drainage or tearing.  Visual acuity is 20/20 in each eye and in  both eyes.  Suspect symptoms are consistent with allergic conjunctivitis as Julie Berry states the eyes feel "itchy and irritated".  Julie Berry now states that symptoms are present in the right eye.  Will treat with Pataday 0.7% solution.  Supportive care recommendations were provided and discussed with the Julie Berry to include over-the-counter antihistamines for itching such as Zyrtec or Benadryl, cool compresses to the eyes to help with pain or swelling, warm compresses for discomfort, and the use of Visine or Clear Eyes to help keep the eyes moist and lubricated.  Julie Berry was advised to follow-up if Julie Berry experiences redness, purulent drainage, or other concerns.  Julie Berry is in agreement with this plan of care and verbalizes understanding.  All questions were answered.  Julie Berry stable for discharge.   Final Clinical Impressions(s) / UC Diagnoses   Final diagnoses:  Allergic conjunctivitis of both eyes     Discharge Instructions      Use eyedrops as prescribed.  Cool compresses to the eyes to help with pain or swelling. May use over-the-counter Visine or Clear Eyes to help keep the eyes moist and lubricated. Strict handwashing when applying medication.  Avoid rubbing or manipulating the eyes while symptoms persist. Discard any eye make-up that were using prior to your symptoms starting. Follow-up in this clinic or with your eye doctor if you experience worsening symptoms, or new symptoms of drainage, redness, or swelling. Follow-up as needed.     ED Prescriptions     Medication Sig Dispense Auth. Provider   Olopatadine HCl (PATADAY) 0.7 % SOLN Apply 1 drop to both eyes twice daily for 7 days. 5 mL Elektra Wartman-Warren, Sadie Haber, NP      PDMP not reviewed this encounter.   Abran Cantor, NP 03/22/23 1019

## 2023-03-22 NOTE — ED Notes (Addendum)
Visual acuity completed with patients corrective lenses

## 2023-03-22 NOTE — ED Triage Notes (Signed)
Pt c/o left eye pain,  pt states she thinks may have gotten her eye make up into her eye making a scratching feeling, but also itch x 1  weeks difficulty seeing out of left

## 2023-03-22 NOTE — Telephone Encounter (Signed)
Pt called stating that [facility] told her that the medication sent in was not a prescription I informed that patietn that this may be because insurance may not cover this medication or that the medication is purchased cheaper OTC. Pt verbalized understanding.

## 2023-05-24 ENCOUNTER — Ambulatory Visit: Payer: Self-pay

## 2023-05-24 ENCOUNTER — Telehealth: Payer: BC Managed Care – PPO | Admitting: Family Medicine

## 2023-05-24 DIAGNOSIS — M79605 Pain in left leg: Secondary | ICD-10-CM

## 2023-05-24 MED ORDER — NAPROXEN 500 MG PO TABS
500.0000 mg | ORAL_TABLET | Freq: Two times a day (BID) | ORAL | 0 refills | Status: AC
Start: 2023-05-24 — End: 2023-05-31

## 2023-05-24 MED ORDER — CYCLOBENZAPRINE HCL 10 MG PO TABS
10.0000 mg | ORAL_TABLET | Freq: Three times a day (TID) | ORAL | 0 refills | Status: AC | PRN
Start: 2023-05-24 — End: 2023-05-31

## 2023-05-24 NOTE — Progress Notes (Signed)
Virtual Visit Consent   Vonzell Schlatter, you are scheduled for a virtual visit with a [facility] provider today. Just as with appointments in the office, your consent must be obtained to participate. Your consent will be active for this visit and any virtual visit you may have with one of our providers in the next 365 days. If you have a MyChart account, a copy of this consent can be sent to you electronically.  As this is a virtual visit, video technology does not allow for your provider to perform a traditional examination. This may limit your provider's ability to fully assess your condition. If your provider identifies any concerns that need to be evaluated in person or the need to arrange testing (such as labs, EKG, etc.), we will make arrangements to do so. Although advances in technology are sophisticated, we cannot ensure that it will always work on either your end or our end. If the connection with a video visit is poor, the visit may have to be switched to a telephone visit. With either a video or telephone visit, we are not always able to ensure that we have a secure connection.  By engaging in this virtual visit, you consent to the provision of healthcare and authorize for your insurance to be billed (if applicable) for the services provided during this visit. Depending on your insurance coverage, you may receive a charge related to this service.  I need to obtain your verbal consent now. Are you willing to proceed with your visit today? Julie Berry has provided verbal consent on 05/24/2023 for a virtual visit (video or telephone). Reed Pandy, [place]  Date: 05/24/2023 8:29 AM  Virtual Visit via Video Note   I, Reed Pandy, connected with  Julie Berry  ([id number], August 17, 1981) on 05/24/23 at  8:30 AM EDT by a video-enabled telemedicine application and verified that I am speaking with the correct person using two identifiers.  Location: Patient: Virtual Visit Location  Patient: Home Provider: Virtual Visit Location Provider: Home Office   I discussed the limitations of evaluation and management by telemedicine and the availability of in person appointments. The patient expressed understanding and agreed to proceed.    History of Present Illness: Julie Berry is a 42 y.o. who identifies as a female who was assigned female at birth, and is being seen today for c/o I have been experiencing pain in left leg that started on her back and has radiated down  her leg to her feet.  Pt states symptoms started two days ago.  Pt states she has been taking some over the counter Aleve and Ibuprofen was not helpful. Pt does not report bladder or bowel issues.   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   ADULT SEXUAL ABUSE NEC 06/16/2008   Essential hypertension 02/26/2007   HYPERGLYCEMIA 02/26/2007   MORBID OBESITY 02/13/2007   DEPRESSION 02/13/2007    Allergies:  Allergies  Allergen Reactions   Amoxicillin Anaphylaxis and Other (See Comments)    Has patient had a PCN reaction causing immediate rash, facial/tongue/throat swelling, SOB or lightheadedness with hypotension:  Yes Has patient had a PCN reaction causing severe rash involving mucus membranes or skin necrosis: No Has patient had a PCN reaction that required hospitalization No Has patient had a PCN reaction occurring within the last 10 years: No If all of the above answers are "NO", then may proceed with Cephalosporin use.   Penicillins Anaphylaxis and Other (See Comments)    Has patient had a PCN reaction  causing immediate rash, facial/tongue/throat swelling, SOB or lightheadedness with hypotension: Yes Has patient had a PCN reaction causing severe rash involving mucus membranes or skin necrosis: No Has patient had a PCN reaction that required hospitalization No Has patient had a PCN reaction occurring within the last 10 years: No If all of the above answers are "NO", then may proceed with Cephalosporin use.   Dilaudid  [Hydromorphone Hcl]    Meloxicam Other (See Comments)    Caused migraine headache   Peanut (Diagnostic)    Contrast Media [Iodinated Contrast Media] Hives    CT Contrast, had hives for 3 days after cardiac CT   Latex Rash   Trulicity [Dulaglutide] Nausea Only and Palpitations   Medications:  Current Outpatient Medications:    cyclobenzaprine (FLEXERIL) 10 MG tablet, Take 1 tablet (10 mg total) by mouth 3 (three) times daily as needed for up to 7 days for muscle spasms., Disp: 21 tablet, Rfl: 0   naproxen (NAPROSYN) 500 MG tablet, Take 1 tablet (500 mg total) by mouth 2 (two) times daily with a meal for 7 days., Disp: 14 tablet, Rfl: 0   acetaminophen (TYLENOL) 500 MG tablet, Take 1 tablet (500 mg total) by mouth every 6 (six) hours as needed., Disp: 30 tablet, Rfl: 0   ciprofloxacin (CIPRO) 500 MG tablet, Take 500 mg by mouth 2 (two) times daily., Disp: , Rfl:    Continuous Blood Gluc Sensor (FREESTYLE LIBRE 14 DAY SENSOR) MISC, 1 Units by Does not apply route every 14 (fourteen) days., Disp: 2 each, Rfl: 4   doxycycline (VIBRAMYCIN) 100 MG capsule, Take 1 capsule (100 mg total) by mouth 2 (two) times daily., Disp: 20 capsule, Rfl: 0   fluconazole (DIFLUCAN) 150 MG tablet, Take 1 tablet (150 mg total) by mouth daily. Take 1 tablet by mouth one time., Disp: 2 tablet, Rfl: 0   fluticasone (FLONASE) 50 MCG/ACT nasal spray, Place 1 spray into both nostrils 2 (two) times daily., Disp: 16 g, Rfl: 2   fluvastatin XL (LESCOL XL) 80 MG 24 hr tablet, Take 1 tablet (80 mg total) by mouth  at bedtime., Disp: 30 tablet, Rfl: 2   gabapentin (NEURONTIN) 100 MG capsule, Take 1 capsule (100 mg total) by mouth 3 (three) times daily., Disp: 90 capsule, Rfl: 0   Insulin Degludec (TRESIBA FLEXTOUCH) 200 UNIT/ML SOPN, Inject 64 Units into the skin daily. Inject 64 units under the skin once daily. (Patient taking differently: Inject 65 Units into the skin daily. Inject 65 units under the skin once daily.), Disp: 4 pen, Rfl: 2   Insulin Pen Needle (PEN NEEDLES) 33G X 4 MM MISC, 1 each by Other route See admin instructions. Use 1 pen needle to inject insulin 4 times daily., Disp: 120 each, Rfl: 3   insulin regular human CONCENTRATED (HUMULIN R U-500 KWIKPEN) 500 UNIT/ML kwikpen, INJECT 45 UNITS AT BREAKFAST AND 35 UNITS AT LUNCH AND DINNER., Disp: 6 mL, Rfl: 0   ketoconazole (NIZORAL) 2 % cream, Apply 1 application. topically 2 (two) times daily as needed for irritation., Disp: 80 g, Rfl:  0   ketoconazole (NIZORAL) 2 % shampoo, Apply 1 application. topically 2 (two) times a week., Disp: 120 mL, Rfl: 0   meloxicam (MOBIC) 15 MG tablet, Take 1 tablet (15 mg total) by mouth daily., Disp: 20 tablet, Rfl: 0   Olopatadine HCl (PATADAY) 0.7 % SOLN, Apply 1 drop to both eyes twice daily for 7 days., Disp: 5 mL, Rfl: 0   ondansetron (ZOFRAN) 4 MG tablet, Take 1 tablet (4 mg total) by mouth every 8 (eight) hours as needed for nausea or vomiting., Disp: 20 tablet, Rfl: 0   promethazine-dextromethorphan (PROMETHAZINE-DM) 6.25-15 MG/5ML syrup, Take 5 mLs by mouth 4 (four) times daily as needed for cough., Disp: 118 mL, Rfl: 0   Semaglutide,0.25 or 0.5MG /DOS, (OZEMPIC, 0.25 OR 0.5 MG/DOSE,) 2 MG/1.5ML SOPN, Inject 0.5 mg into the skin once a week., Disp: 1 pen, Rfl: 2  Observations/Objective: Patient is well-developed, well-nourished in no acute distress.  Resting comfortably at home.  Head is normocephalic, atraumatic.  No labored breathing.  Speech is clear and coherent with logical content.  Patient is  alert and oriented at baseline.    Assessment and Plan: 1. Pain of left lower extremity - naproxen (NAPROSYN) 500 MG tablet; Take 1 tablet (500 mg total) by mouth 2 (two) times daily with a meal for 7 days.  Dispense: 14 tablet; Refill: 0 - cyclobenzaprine (FLEXERIL) 10 MG tablet; Take 1 tablet (10 mg total) by mouth 3 (three) times daily as needed for up to 7 days for muscle spasms.  Dispense: 21 tablet; Refill: 0  -Pt stated she was ok to take Naprosyn and verbalized she was not pregnant  -Advised Pt if symptoms persisted to follow up with urgent care or with PCP  -Pt verbalized understanding   Follow Up Instructions: I discussed the assessment and treatment plan with the patient. The patient was provided an opportunity to ask questions and all were answered. The patient agreed with the plan and demonstrated an understanding of the instructions.  A copy of instructions were sent to the patient via MyChart unless otherwise noted below.     The patient was advised to call back or seek an in-person evaluation if the symptoms worsen or if the condition fails to improve as anticipated.  Time:  I spent 10 minutes with the patient via telehealth technology discussing the above problems/concerns.    Reed Pandy, PA-C

## 2023-05-24 NOTE — Patient Instructions (Signed)
Julie Berry, thank you for joining Reed Pandy, PA-C for today's virtual visit.  While this provider is not your primary care provider (PCP), if your PCP is located in our provider database this encounter information will be shared with them immediately following your visit.   A [facility] MyChart account gives you access to today's visit and all your visits, tests, and labs performed at [facility] " click here if you don't have a [facility] MyChart account or go to [email or web address]  Consent: (Patient) Julie Berry provided verbal consent for this virtual visit at the beginning of the encounter.  Current Medications:  Current Outpatient Medications:    cyclobenzaprine (FLEXERIL) 10 MG tablet, Take 1 tablet (10 mg total) by mouth 3 (three) times daily as needed for up to 7 days for muscle spasms., Disp: 21 tablet, Rfl: 0   naproxen (NAPROSYN) 500 MG tablet, Take 1 tablet (500 mg total) by mouth 2 (two) times daily with a meal for 7 days., Disp: 14 tablet, Rfl: 0   acetaminophen (TYLENOL) 500 MG tablet, Take 1 tablet (500 mg total) by mouth every 6 (six) hours as needed., Disp: 30 tablet, Rfl: 0   ciprofloxacin (CIPRO) 500 MG tablet, Take 500 mg by mouth 2 (two) times daily., Disp: , Rfl:    Continuous Blood Gluc Sensor (FREESTYLE LIBRE 14 DAY SENSOR) MISC, 1 Units by Does not apply route every 14 (fourteen) days., Disp: 2 each, Rfl: 4   doxycycline (VIBRAMYCIN) 100 MG capsule, Take 1 capsule (100 mg total) by mouth 2 (two) times daily., Disp: 20 capsule, Rfl: 0   fluconazole (DIFLUCAN) 150 MG tablet, Take 1 tablet (150 mg total) by mouth daily. Take 1 tablet by mouth one time., Disp: 2 tablet, Rfl: 0   fluticasone (FLONASE) 50 MCG/ACT nasal spray, Place 1 spray into both nostrils 2 (two) times daily., Disp: 16 g, Rfl: 2   fluvastatin XL (LESCOL XL) 80 MG 24 hr tablet, Take 1 tablet (80 mg total) by mouth at bedtime., Disp: 30 tablet, Rfl: 2   gabapentin  (NEURONTIN) 100 MG capsule, Take 1 capsule (100 mg total) by mouth 3 (three) times daily., Disp: 90 capsule, Rfl: 0   Insulin Degludec (TRESIBA FLEXTOUCH) 200 UNIT/ML SOPN, Inject 64 Units into the skin daily. Inject 64 units under the skin once daily. (Patient taking differently: Inject 65 Units into the skin daily. Inject 65 units under the skin once daily.), Disp: 4 pen, Rfl: 2   Insulin Pen Needle (PEN NEEDLES) 33G X 4 MM MISC, 1 each by Other route See admin instructions. Use 1 pen needle to inject insulin 4 times daily., Disp: 120 each, Rfl: 3   insulin regular human CONCENTRATED (HUMULIN R U-500 KWIKPEN) 500 UNIT/ML kwikpen, INJECT 45 UNITS AT BREAKFAST AND 35 UNITS AT LUNCH AND DINNER., Disp: 6 mL, Rfl: 0   ketoconazole (NIZORAL) 2 % cream, Apply 1 application. topically 2 (two) times daily as needed for irritation., Disp: 80 g, Rfl: 0   ketoconazole (NIZORAL) 2 % shampoo, Apply 1 application. topically 2 (two) times a week., Disp: 120 mL, Rfl: 0   meloxicam (MOBIC) 15 MG tablet, Take 1 tablet (15 mg total) by mouth daily., Disp: 20 tablet, Rfl: 0   Olopatadine HCl (PATADAY) 0.7 % SOLN, Apply 1 drop to both eyes twice daily for 7 days., Disp: 5 mL, Rfl: 0   ondansetron (ZOFRAN) 4 MG tablet, Take 1 tablet (4 mg total) by mouth every 8 (eight) hours as needed  for nausea or vomiting., Disp: 20 tablet, Rfl: 0   promethazine-dextromethorphan (PROMETHAZINE-DM) 6.25-15 MG/5ML syrup, Take 5 mLs by mouth 4 (four) times daily as needed for cough., Disp: 118 mL, Rfl: 0   Semaglutide,0.25 or 0.5MG /DOS, (OZEMPIC, 0.25 OR 0.5 MG/DOSE,) 2 MG/1.5ML SOPN, Inject 0.5 mg into the skin once a week., Disp: 1 pen, Rfl: 2   Medications ordered in this encounter:  Meds ordered this encounter  Medications   naproxen (NAPROSYN) 500 MG tablet    Sig: Take 1 tablet (500 mg total) by mouth 2 (two) times daily with a meal for 7 days.    Dispense:  14 tablet    Refill:  0   cyclobenzaprine (FLEXERIL) 10 MG tablet     Sig: Take 1 tablet (10 mg total) by mouth 3 (three) times daily as needed for up to 7 days for muscle spasms.    Dispense:  21 tablet    Refill:  0     *If you need refills on other medications prior to your next appointment, please contact your pharmacy*  Follow-Up: Call back or seek an in-person evaluation if the symptoms worsen or if the condition fails to improve as anticipated.  [facility] [phone]  Other Instructions Sciatica  Sciatica is pain, numbness, weakness, or tingling along the path of the sciatic nerve. The sciatic nerve starts in the lower back and runs down the back of each leg. The nerve controls the muscles in the lower leg and in the back of the knee. It also provides feeling (sensation) to the back of the thigh, the lower leg, and the sole of the foot. Sciatica is a symptom of another medical condition that pinches or puts pressure on the sciatic nerve. Sciatica most often only affects one side of the body. Sciatica usually goes away on its own or with treatment. In some cases, sciatica may come back (recur). What are the causes? This condition is caused by pressure on the sciatic nerve or pinching of the nerve. This may be the result of: A disk in between the bones of the spine bulging out too far (herniated disk). Age-related changes in the spinal disks. A pain disorder that affects a muscle in the buttock. Extra bone growth near the sciatic nerve. A break (fracture) of the pelvis. Pregnancy. Tumor. This is rare. What increases the risk? The following factors may make you more likely to develop this condition: Playing sports that place pressure or stress on the spine. Having poor strength and flexibility. A history of back injury or surgery. Sitting for long periods of time. Doing activities that involve repetitive bending or lifting. Obesity. What are the signs or symptoms? Symptoms can vary from mild to very severe. They may  include: Any of the following problems in the lower back, leg, hip, or buttock: Mild tingling, numbness, or dull aches. Burning sensations. Sharp pains. Numbness in the back of the calf or the sole of the foot. Leg weakness. Severe back pain that makes movement difficult. Symptoms may get worse when you cough, sneeze, or laugh, or when you sit or stand for long periods of time. How is this diagnosed? This condition may be diagnosed based on: Your symptoms and medical history. A physical exam. Blood tests. Imaging tests, such as: X-rays. An MRI. A CT scan. How is this treated? In many cases, this condition improves on its own without treatment. However, treatment may include: Reducing or modifying physical activity. Exercising, including strengthening and stretching.  Icing and applying heat to the affected area. Medicines that help to: Relieve pain and swelling. Relax your muscles. Injections of medicines that help to relieve pain and inflammation (steroids) around the sciatic nerve. Surgery. Follow these instructions at home: Medicines Take over-the-counter and prescription medicines only as told by your health care provider. Ask your health care provider if the medicine prescribed to you requires you to avoid driving or using heavy machinery. Managing pain     If directed, put ice on the affected area. To do this: Put ice in a plastic bag. Place a towel between your skin and the bag. Leave the ice on for 20 minutes, 2-3 times a day. If your skin turns bright red, remove the ice right away to prevent skin damage. The risk of skin damage is higher if you cannot feel pain, heat, or cold. If directed, apply heat to the affected area as often as told by your health care provider. Use the heat source that your health care provider recommends, such as a moist heat pack or a heating pad. Place a towel between your skin and the heat source. Leave the heat on for 20-30 minutes. If  your skin turns bright red, remove the heat right away to prevent burns. The risk of burns is higher if you cannot feel pain, heat, or cold. Activity  Return to your normal activities as told by your health care provider. Ask your health care provider what activities are safe for you. Avoid activities that make your symptoms worse. Take brief periods of rest throughout the day. When you rest for longer periods, mix in some mild activity or stretching between periods of rest. This will help to prevent stiffness and pain. Avoid sitting for long periods of time without moving. Get up and move around at least one time each hour. Exercise and stretch regularly as told by your health care provider. Do not lift anything that is heavier than 10 lb (4.5 kg) until your health care provider says that it is safe. When you do not have symptoms, you should still avoid heavy lifting, especially repetitive heavy lifting. When you lift objects, always use proper lifting technique, which includes: Bending your knees. Keeping the load close to your body. Avoiding twisting. General instructions Maintain a healthy weight. Excess weight puts extra stress on your back. Wear supportive, comfortable shoes. Avoid wearing high heels. Avoid sleeping on a mattress that is too soft or too hard. A mattress that is firm enough to support your back when you sleep may help to reduce your pain. Contact a health care provider if: Your pain is not controlled by medicine. Your pain does not improve or gets worse. Your pain lasts longer than 4 weeks. You have unexplained weight loss. Get help right away if: You are not able to control when you urinate or have bowel movements (incontinence). You have: Weakness in your lower back, pelvis, buttocks, or legs that gets worse. Redness or swelling of your back. A burning sensation when you urinate. Summary Sciatica is pain, numbness, weakness, or tingling along the path of the  sciatic nerve, which may include the lower back, legs, hips, and buttocks. This condition is caused by pressure on the sciatic nerve or pinching of the nerve. Treatment often includes rest, exercise, medicines, and applying ice or heat. This information is not intended to replace advice given to you by your health care provider. Make sure you discuss any questions you have with your health care provider. Document  Revised: 11/12/2021 Document Reviewed: 11/12/2021 Elsevier Patient Education  2024 [employer]    If you have been instructed to have an in-person evaluation today at a local Urgent Care facility, please use the link below. It will take you to a list of all of our available [facility], including address, phone number and hours of operation. Please do not delay care.  [facility]  If you or a family member do not have a primary care provider, use the link below to schedule a visit and establish care. When you choose a [facility] primary care physician or advanced practice provider, you gain a long-term partner in health. Find a Primary Care Provider  Learn more about [facility]'s in-office and virtual care options: [facility] - Get Care Now

## 2023-05-28 NOTE — Telephone Encounter (Signed)
Opened in error

## 2023-06-03 ENCOUNTER — Other Ambulatory Visit (HOSPITAL_COMMUNITY): Payer: Self-pay | Admitting: Internal Medicine

## 2023-06-03 DIAGNOSIS — Z1231 Encounter for screening mammogram for malignant neoplasm of breast: Secondary | ICD-10-CM

## 2023-06-06 ENCOUNTER — Ambulatory Visit (HOSPITAL_COMMUNITY)
Admission: RE | Admit: 2023-06-06 | Discharge: 2023-06-06 | Disposition: A | Discharge status: Home / Self Care | Payer: BC Managed Care – PPO | Source: Ambulatory Visit | Attending: Internal Medicine | Admitting: Internal Medicine

## 2023-06-06 ENCOUNTER — Inpatient Hospital Stay (HOSPITAL_COMMUNITY): Admission: RE | Admit: 2023-06-06 | Payer: BC Managed Care – PPO | Source: Ambulatory Visit

## 2023-06-06 ENCOUNTER — Encounter (HOSPITAL_COMMUNITY): Payer: Self-pay

## 2023-06-06 DIAGNOSIS — Z1231 Encounter for screening mammogram for malignant neoplasm of breast: Secondary | ICD-10-CM | POA: Diagnosis present

## 2023-10-30 ENCOUNTER — Emergency Department (HOSPITAL_COMMUNITY)

## 2023-10-30 ENCOUNTER — Other Ambulatory Visit: Payer: Self-pay

## 2023-10-30 ENCOUNTER — Encounter (HOSPITAL_COMMUNITY): Payer: Self-pay | Admitting: *Deleted

## 2023-10-30 ENCOUNTER — Inpatient Hospital Stay (HOSPITAL_COMMUNITY)
Admission: EM | Admit: 2023-10-30 | Discharge: 2023-11-02 | DRG: 872 | Disposition: A | Discharge status: Home / Self Care | Attending: Family Medicine | Admitting: Family Medicine

## 2023-10-30 DIAGNOSIS — E785 Hyperlipidemia, unspecified: Secondary | ICD-10-CM | POA: Diagnosis present

## 2023-10-30 DIAGNOSIS — Z833 Family history of diabetes mellitus: Secondary | ICD-10-CM | POA: Diagnosis not present

## 2023-10-30 DIAGNOSIS — I10 Essential (primary) hypertension: Secondary | ICD-10-CM | POA: Diagnosis present

## 2023-10-30 DIAGNOSIS — G4733 Obstructive sleep apnea (adult) (pediatric): Secondary | ICD-10-CM | POA: Diagnosis present

## 2023-10-30 DIAGNOSIS — Z886 Allergy status to analgesic agent status: Secondary | ICD-10-CM

## 2023-10-30 DIAGNOSIS — Z880 Allergy status to penicillin: Secondary | ICD-10-CM | POA: Diagnosis not present

## 2023-10-30 DIAGNOSIS — L03317 Cellulitis of buttock: Secondary | ICD-10-CM | POA: Diagnosis present

## 2023-10-30 DIAGNOSIS — K611 Rectal abscess: Secondary | ICD-10-CM | POA: Diagnosis present

## 2023-10-30 DIAGNOSIS — E88819 Insulin resistance, unspecified: Secondary | ICD-10-CM | POA: Diagnosis present

## 2023-10-30 DIAGNOSIS — Z8249 Family history of ischemic heart disease and other diseases of the circulatory system: Secondary | ICD-10-CM

## 2023-10-30 DIAGNOSIS — L0231 Cutaneous abscess of buttock: Secondary | ICD-10-CM | POA: Diagnosis present

## 2023-10-30 DIAGNOSIS — E871 Hypo-osmolality and hyponatremia: Secondary | ICD-10-CM | POA: Diagnosis present

## 2023-10-30 DIAGNOSIS — Z885 Allergy status to narcotic agent status: Secondary | ICD-10-CM

## 2023-10-30 DIAGNOSIS — A419 Sepsis, unspecified organism: Secondary | ICD-10-CM | POA: Diagnosis present

## 2023-10-30 DIAGNOSIS — Z6841 Body Mass Index (BMI) 40.0 and over, adult: Secondary | ICD-10-CM

## 2023-10-30 DIAGNOSIS — Z91041 Radiographic dye allergy status: Secondary | ICD-10-CM

## 2023-10-30 DIAGNOSIS — E8881 Metabolic syndrome: Secondary | ICD-10-CM | POA: Diagnosis present

## 2023-10-30 DIAGNOSIS — Z794 Long term (current) use of insulin: Secondary | ICD-10-CM

## 2023-10-30 DIAGNOSIS — K219 Gastro-esophageal reflux disease without esophagitis: Secondary | ICD-10-CM | POA: Insufficient documentation

## 2023-10-30 DIAGNOSIS — Z888 Allergy status to other drugs, medicaments and biological substances status: Secondary | ICD-10-CM

## 2023-10-30 DIAGNOSIS — E878 Other disorders of electrolyte and fluid balance, not elsewhere classified: Secondary | ICD-10-CM | POA: Diagnosis present

## 2023-10-30 DIAGNOSIS — R935 Abnormal findings on diagnostic imaging of other abdominal regions, including retroperitoneum: Secondary | ICD-10-CM

## 2023-10-30 DIAGNOSIS — E1165 Type 2 diabetes mellitus with hyperglycemia: Secondary | ICD-10-CM | POA: Diagnosis present

## 2023-10-30 DIAGNOSIS — Z91010 Allergy to peanuts: Secondary | ICD-10-CM | POA: Diagnosis not present

## 2023-10-30 DIAGNOSIS — Z91040 Latex allergy status: Secondary | ICD-10-CM

## 2023-10-30 LAB — COMPREHENSIVE METABOLIC PANEL
ALT: 15 U/L (ref 0–44)
AST: 17 U/L (ref 15–41)
Albumin: 3.5 g/dL (ref 3.5–5.0)
Alkaline Phosphatase: 73 U/L (ref 38–126)
Anion gap: 12 (ref 5–15)
BUN: 13 mg/dL (ref 6–20)
CO2: 25 mmol/L (ref 22–32)
Calcium: 9.1 mg/dL (ref 8.9–10.3)
Chloride: 97 mmol/L — ABNORMAL LOW (ref 98–111)
Creatinine, Ser: 0.85 mg/dL (ref 0.44–1.00)
GFR, Estimated: >60 mL/min (ref >60)
Glucose, Bld: 308 mg/dL — ABNORMAL HIGH (ref 70–99)
Potassium: 3.6 mmol/L (ref 3.5–5.1)
Sodium: 134 mmol/L — ABNORMAL LOW (ref 135–145)
Total Bilirubin: 0.6 mg/dL (ref 0.0–1.2)
Total Protein: 7.8 g/dL (ref 6.5–8.1)

## 2023-10-30 LAB — CBC WITH DIFFERENTIAL/PLATELET
Abs Immature Granulocytes: 0.1 10*3/uL — ABNORMAL HIGH (ref 0.00–0.07)
Basophils Absolute: 0.1 10*3/uL (ref 0.0–0.1)
Basophils Relative: 0 %
Eosinophils Absolute: 0.5 10*3/uL (ref 0.0–0.5)
Eosinophils Relative: 3 %
HCT: 43 % (ref 36.0–46.0)
Hemoglobin: 13.9 g/dL (ref 12.0–15.0)
Immature Granulocytes: 1 %
Lymphocytes Relative: 14 %
Lymphs Abs: 2.3 10*3/uL (ref 0.7–4.0)
MCH: 27.7 pg (ref 26.0–34.0)
MCHC: 32.3 g/dL (ref 30.0–36.0)
MCV: 85.7 fL (ref 80.0–100.0)
Monocytes Absolute: 1.1 10*3/uL — ABNORMAL HIGH (ref 0.1–1.0)
Monocytes Relative: 7 %
Neutro Abs: 12 10*3/uL — ABNORMAL HIGH (ref 1.7–7.7)
Neutrophils Relative %: 75 %
Platelets: 307 10*3/uL (ref 150–400)
RBC: 5.02 MIL/uL (ref 3.87–5.11)
RDW: 12.6 % (ref 11.5–15.5)
WBC: 16 10*3/uL — ABNORMAL HIGH (ref 4.0–10.5)
nRBC: 0 % (ref 0.0–0.2)

## 2023-10-30 LAB — HCG, SERUM, QUALITATIVE: Preg, Serum: NEGATIVE

## 2023-10-30 LAB — CBG MONITORING, ED: Glucose-Capillary: 294 mg/dL — ABNORMAL HIGH (ref 70–99)

## 2023-10-30 MED ORDER — CIPROFLOXACIN IN D5W 400 MG/200ML IV SOLN
400.0000 mg | Freq: Once | INTRAVENOUS | Status: AC
  Administered 2023-10-31: 400 mg via INTRAVENOUS
  Filled 2023-10-30: qty 200

## 2023-10-30 MED ORDER — OXYCODONE-ACETAMINOPHEN 5-325 MG PO TABS
1.0000 | ORAL_TABLET | Freq: Once | ORAL | Status: DC
  Filled 2023-10-30: qty 1

## 2023-10-30 MED ORDER — LIDOCAINE-EPINEPHRINE (PF) 2 %-1:200000 IJ SOLN
10.0000 mL | Freq: Once | INTRAMUSCULAR | Status: AC
  Administered 2023-10-30: 10 mL
  Filled 2023-10-30: qty 20

## 2023-10-30 MED ORDER — METRONIDAZOLE 500 MG/100ML IV SOLN
500.0000 mg | Freq: Once | INTRAVENOUS | Status: AC
  Administered 2023-10-31: 500 mg via INTRAVENOUS
  Filled 2023-10-30: qty 100

## 2023-10-30 NOTE — ED Notes (Signed)
 Pt given a ginger ale and a sandwich per PA.

## 2023-10-30 NOTE — ED Provider Notes (Incomplete)
 [place] EMERGENCY DEPARTMENT AT [facility] Provider Note   CSN: [id number] Arrival date & time: 10/30/23  1426     History {Add pertinent medical, surgical, social history, OB history to HPI:1} Chief Complaint  Patient presents with  . Abscess    Julie Berry is a 43 y.o. female.  Patient with history of diabetes, hypertension, morbid obesity presents today with complaints of abscess. She states that same is located in her left inner buttock and has been present for the past week. Denies history of similar previously. She saw surgery through [facility] yesterday who scheduled her to have an outpatient CT scan to look at it better. She states that her pain worsened today and presents for same. Denies fevers or chills. No abdominal pain.  The history is provided by the patient. No language interpreter was used.  Abscess      Home Medications Prior to Admission medications   Medication Sig Start Date End Date Taking? Authorizing Provider  acetaminophen (TYLENOL) 500 MG tablet Take 1 tablet (500 mg total) by mouth every 6 (six) hours as needed. 01/18/20  Yes Avegno, Zachery Dakins, FNP  clindamycin (CLEOCIN) 300 MG capsule 1 capsule (300 mg total) four (4) times a day. 10/26/23  Yes [provider]  fluconazole (DIFLUCAN) 100 MG tablet Take 1 tablet by mouth daily. 10/24/23  Yes [provider]  insulin regular human CONCENTRATED (HUMULIN R U-500 KWIKPEN) 500 UNIT/ML kwikpen INJECT 45 UNITS AT BREAKFAST AND 35 UNITS AT LUNCH AND DINNER. Patient taking differently: 90-100 Units. INJECT 100 UNITS AT BREAKFAST AND 90 UNITS AT  DINNER. 08/09/19  Yes Reather Littler, MD  naproxen (NAPROSYN) 500 MG tablet Take by mouth. 10/29/23 10/28/24 Yes [provider]  ondansetron (ZOFRAN) 4 MG tablet Take 1 tablet (4 mg total) by mouth every 8 (eight) hours as needed for nausea or vomiting. 04/05/19  Yes Reather Littler, MD  Continuous Blood Gluc Sensor (FREESTYLE LIBRE 14 DAY  SENSOR) MISC 1 Units by Does not apply route every 14 (fourteen) days. 10/26/18   Reather Littler, MD  Insulin Pen Needle (PEN NEEDLES) 33G X 4 MM MISC 1 each by Other route See admin instructions. Use 1 pen needle to inject insulin 4 times daily. 01/27/19   Reather Littler, MD  OZEMPIC, 1 MG/DOSE, 4 MG/3ML SOPN Inject into the skin. Patient not taking: Reported on 10/30/2023 05/27/22   [provider]  pantoprazole (PROTONIX) 40 MG tablet Take 1 tablet every day by oral route.    [provider]  sulfamethoxazole-trimethoprim (BACTRIM DS) 800-160 MG tablet Take by mouth. Patient not taking: Reported on 10/30/2023 10/24/23   [provider]      Allergies    Amoxicillin, Penicillins, Dilaudid  [hydromorphone hcl], Meloxicam, Peanut (diagnostic), Contrast media [iodinated contrast media], Latex, and Trulicity [dulaglutide]    Review of Systems   Review of Systems  All other systems reviewed and are negative.   Physical Exam Updated Vital Signs BP 110/64   Pulse 87   Temp 98.2 F (36.8 C) (Oral)   Resp 17   Ht 5\' 6"  (1.676 m)   Wt 132.9 kg   LMP 10/25/2023   SpO2 98%   BMI 47.29 kg/m  Physical Exam Vitals and nursing note reviewed. Exam conducted with a chaperone present.  Constitutional:      General: She is not in acute distress.    Appearance: Normal appearance. She is normal weight. She is not ill-appearing, toxic-appearing or diaphoretic.  HENT:  Head: Normocephalic and atraumatic.  Cardiovascular:     Rate and Rhythm: Normal rate.  Pulmonary:     Effort: Pulmonary effort is normal. No respiratory distress.  Abdominal:     General: Abdomen is flat.     Palpations: Abdomen is soft.     Tenderness: There is no abdominal tenderness.  Genitourinary:    Comments: Small area of fluctuance with diffuse induration noted to he left perirectal region. No active drainage Musculoskeletal:        General: Normal range of motion.     Cervical back: Normal range of  motion.  Skin:    General: Skin is warm and dry.  Neurological:     General: No focal deficit present.     Mental Status: She is alert.  Psychiatric:        Mood and Affect: Mood normal.        Behavior: Behavior normal.     ED Results / Procedures / Treatments   Labs (all labs ordered are listed, but only abnormal results are displayed) Labs Reviewed  CBC WITH DIFFERENTIAL/PLATELET - Abnormal; Notable for the following components:      Result Value   WBC 16.0 (*)    Neutro Abs 12.0 (*)    Monocytes Absolute 1.1 (*)    Abs Immature Granulocytes 0.10 (*)    All other components within normal limits  COMPREHENSIVE METABOLIC PANEL - Abnormal; Notable for the following components:   Sodium 134 (*)    Chloride 97 (*)    Glucose, Bld 308 (*)    All other components within normal limits  CBG MONITORING, ED - Abnormal; Notable for the following components:   Glucose-Capillary 294 (*)    All other components within normal limits  HCG, SERUM, QUALITATIVE    EKG None  Radiology CT PELVIS WO CONTRAST Result Date: 10/30/2023 CLINICAL DATA:  Abscess to the left buttocks x1 week. EXAM: CT PELVIS WITHOUT CONTRAST TECHNIQUE: Multidetector CT imaging of the pelvis was performed following the standard protocol without intravenous contrast. RADIATION DOSE REDUCTION: This exam was performed according to the departmental dose-optimization program which includes automated exposure control, adjustment of the mA and/or kV according to patient size and/or use of iterative reconstruction technique. COMPARISON:  None Available. FINDINGS: Urinary Tract:  No abnormality visualized. Bowel:  Unremarkable visualized pelvic bowel loops. Vascular/Lymphatic: No pathologically enlarged lymph nodes. No significant vascular abnormality seen. Reproductive: A 6.3 cm x 4.7 cm x 6.2 cm well-defined area of soft tissue attenuation (approximately 53.33 Hounsfield units) is seen within the right adnexa. This represents a  new finding when compared to the prior study. Other:  None. Musculoskeletal: Moderate severity left-sided perirectal inflammatory fat stranding is seen. This extends inferiorly along the left gluteal fold. A 6.5 mm x 12.6 mm x 11.9 mm ill-defined area of fluid attenuation (approximately 23.07 Hounsfield units) is seen within the perirectal region on the left (axial CT image 121, CT series 5/coronal reformatted image 110, CT series 8). No acute osseous abnormalities are identified. IMPRESSION: 1. Moderate severity left-sided perirectal inflammatory fat stranding with a small left perirectal abscess. 2. 6.3 cm x 4.7 cm x 6.2 cm well-defined area of soft tissue attenuation within the right adnexa. While this may represent a hemorrhagic cyst or exophytic uterine fibroid, further evaluation with pelvic ultrasound is recommended. Electronically Signed   By: Aram Candela M.D.   On: 10/30/2023 21:49    Procedures Procedures  {Document cardiac monitor, telemetry assessment procedure when appropriate:1}  Medications  Ordered in ED Medications  oxyCODONE-acetaminophen (PERCOCET/ROXICET) 5-325 MG per tablet 1 tablet (1 tablet Oral Not Given 10/30/23 1901)  lidocaine-EPINEPHrine (XYLOCAINE W/EPI) 2 %-1:200000 (PF) injection 10 mL (has no administration in time range)  ciprofloxacin (CIPRO) IVPB 400 mg (has no administration in time range)  metroNIDAZOLE (FLAGYL) IVPB 500 mg (has no administration in time range)    ED Course/ Medical Decision Making/ A&P   {   Click here for ABCD2, HEART and other calculatorsREFRESH Note before signing :1}                              Medical Decision Making Amount and/or Complexity of Data Reviewed Labs: ordered. Radiology: ordered.  Risk Prescription drug management.   This patient is a 43 y.o. female who presents to the ED for concern of abscess, this involves an extensive number of treatment options, and is a complaint that carries with it a high risk of  complications and morbidity. The emergent differential diagnosis prior to evaluation includes, but is not limited to,  sepsis, necrotizing fascitis, abscess, fistula . This is not an exhaustive differential.   Past Medical History / Co-morbidities / Social History:  has a past medical history of Complication of anesthesia, Depressed, Diabetes mellitus without complication (HCC), Gallstones, HTN (hypertension), Hyperglycemia, Leukocytosis, Low back pain, Lump or mass in breast, Malaise and fatigue, Morbid obesity (HCC), Sciatica, Sleep apnea, and UTI (urinary tract infection).  Additional history: Chart reviewed. Pertinent results include: seen by surgery yesterday, no clean area to drain seen on physical exam. Plan was for outpatient CT and I&D after if needed  Physical Exam: Physical exam performed. The pertinent findings include: ***  Lab Tests: I ordered, and personally interpreted labs.  The pertinent results include:  ***   Imaging Studies: I ordered imaging studies including ***. I independently visualized and interpreted imaging which showed ***. I agree with the radiologist interpretation.   Cardiac Monitoring:  The patient was maintained on a cardiac monitor.  My attending physician Dr. Marland Kitchen viewed and interpreted the cardiac monitored which showed an underlying rhythm of: ***. I agree with this interpretation.   Medications: I ordered medication including ***  for ***. Reevaluation of the patient after these medicines showed that the patient {resolved/improved/worsened:23923::"improved"}. I have reviewed the patients home medicines and have made adjustments as needed.  Consultations Obtained: I requested consultation with the ***,  and discussed lab and imaging findings as well as pertinent plan - they recommend: ***   Disposition: After consideration of the diagnostic results and the patients response to treatment, I feel that *** .   ***emergency department workup does not  suggest an emergent condition requiring admission or immediate intervention beyond what has been performed at this time. The plan is: ***. The patient is safe for discharge and has been instructed to return immediately for worsening symptoms, change in symptoms or any other concerns.  I discussed this case with my attending physician Dr. Marland Kitchen who cosigned this note including patient's presenting symptoms, physical exam, and planned diagnostics and interventions. Attending physician stated agreement with plan or made changes to plan which were implemented.     {Document critical care time when appropriate:1} {Document review of labs and clinical decision tools ie heart score, Chads2Vasc2 etc:1}  {Document your independent review of radiology images, and any outside records:1} {Document your discussion with family members, caretakers, and with consultants:1} {Document social determinants of health affecting pt's care:1} {  Document your decision making why or why not admission, treatments were needed:1} Final Clinical Impression(s) / ED Diagnoses Final diagnoses:  None    Rx / DC Orders ED Discharge Orders     None

## 2023-10-30 NOTE — ED Notes (Signed)
 Patient asked if she could have something to eat b/c she is a diabetic and her sugar was low, CBG obtained result 294

## 2023-10-30 NOTE — ED Notes (Signed)
 Pt changed into gown.

## 2023-10-30 NOTE — H&P (Signed)
 [place]   PATIENT NAME: Julie Berry    MR#:  [id number]  DATE OF BIRTH:  September 11, 1980  DATE OF ADMISSION:  10/30/2023  PRIMARY CARE PHYSICIAN: Elfredia Nevins, MD   Patient is coming from: Home  REQUESTING/REFERRING PHYSICIAN: Smoot, Meriel Flavors  CHIEF COMPLAINT:   Chief Complaint  Patient presents with  . Abscess    HISTORY OF PRESENT ILLNESS:  RANEY ANTWINE is a 43 y.o. African-American female with medical history significant for type diabetes mellitus, hypertension, dyslipidemia, OSA, who presented to emergency room with acute onset of left gluteal pain with swelling, warmth.  It has become throbbing before she came to the ER.  She denied any fever or chills.  No nausea or vomiting or abdominal pain.  No blood glucose levels have been elevated.  No chest pain or palpitations.  No dysuria, oliguria or hematuria or flank pain.  ED Course: When she came to the ER, vital signs were within normal.  Labs revealed Hyponatremia 134 with hypochloremia of 97 and blood glucose of 308 with otherwise unremarkable CMP.  CBC showed leukocytosis of 16 with neutrophilia.  Urine pregnancy test was negative. EKG as reviewed by me : None Imaging: Pelvic CT without contrast revealed the following: 1. Moderate severity left-sided perirectal inflammatory fat stranding with a small left perirectal abscess. 2. 6.3 cm x 4.7 cm x 6.2 cm well-defined area of soft tissue attenuation within the right adnexa. While this may represent a hemorrhagic cyst or exophytic uterine fibroid, further evaluation with pelvic ultrasound is recommended.  The patient was given IV Cipro and Flagyl, a gram of p.o. Tylenol 1 p.o. Percocet, and lidocaine with epi and had a I&D by the ER provider.  Contact was made with Dr. Robyne Peers who recommended admission for further IV antibiotic therapy.  She will be admitted to a medical bed for further evaluation and management.  PAST MEDICAL HISTORY:   Past Medical  History:  Diagnosis Date  . Complication of anesthesia    heartrate dropped with anesthesia  . Depressed   . Diabetes mellitus without complication (HCC)   . Gallstones   . HTN (hypertension)    pt denies  . Hyperglycemia   . Leukocytosis   . Low back pain   . Lump or mass in breast   . Malaise and fatigue   . Morbid obesity (HCC)   . Sciatica   . Sleep apnea   . UTI (urinary tract infection)     PAST SURGICAL HISTORY:   Past Surgical History:  Procedure Laterality Date  . CHOLECYSTECTOMY  06/22/2012   Procedure: LAPAROSCOPIC CHOLECYSTECTOMY;  Surgeon: Dalia Heading, MD;  Location: [facility] ORS;  Service: General;  Laterality: N/A;  . ESOPHAGOGASTRODUODENOSCOPY N/A 06/09/2014   Procedure: ESOPHAGOGASTRODUODENOSCOPY (EGD);  Surgeon: Malissa Hippo, MD;  Location: [facility] ENDO SUITE;  Service: Endoscopy;  Laterality: N/A;  200-moved to 145 Ann to notify pt    SOCIAL HISTORY:   Social History   Tobacco Use  . Smoking status: Never  . Smokeless tobacco: Never  Substance Use Topics  . Alcohol use: No    FAMILY HISTORY:   Family History  Problem Relation Age of Onset  . Diabetes Mother   . Rheumatic fever Mother 47  . Migraines Father 102  . Hypertension Father   . Autism Brother   . Heart disease Neg Hx     DRUG ALLERGIES:   Allergies  Allergen Reactions  . Amoxicillin Anaphylaxis and Other (See  Comments)    Has patient had a PCN reaction causing immediate rash, facial/tongue/throat swelling, SOB or lightheadedness with hypotension: Yes Has patient had a PCN reaction causing severe rash involving mucus membranes or skin necrosis: No Has patient had a PCN reaction that required hospitalization No Has patient had a PCN reaction occurring within the last 10 years: No If all of the above answers are "NO", then may proceed with Cephalosporin use.  Marland Kitchen Penicillins Anaphylaxis and Other (See Comments)    Has patient had a PCN reaction causing immediate rash,  facial/tongue/throat swelling, SOB or lightheadedness with hypotension: Yes Has patient had a PCN reaction causing severe rash involving mucus membranes or skin necrosis: No Has patient had a PCN reaction that required hospitalization No Has patient had a PCN reaction occurring within the last 10 years: No If all of the above answers are "NO", then may proceed with Cephalosporin use.  . Dilaudid  [Hydromorphone Hcl]   . Meloxicam Other (See Comments)    Caused migraine headache  . Peanut (Diagnostic)   . Contrast Media [Iodinated Contrast Media] Hives    CT Contrast, had hives for 3 days after cardiac CT  . Latex Rash  . Trulicity [Dulaglutide] Nausea Only and Palpitations    REVIEW OF SYSTEMS:   ROS As per history of present illness. All pertinent systems were reviewed above. Constitutional, HEENT, cardiovascular, respiratory, GI, GU, musculoskeletal, neuro, psychiatric, endocrine, integumentary and hematologic systems were reviewed and are otherwise negative/unremarkable except for positive findings mentioned above in the HPI.   MEDICATIONS AT HOME:   Prior to Admission medications   Medication Sig Start Date End Date Taking? Authorizing Provider  acetaminophen (TYLENOL) 500 MG tablet Take 1 tablet (500 mg total) by mouth every 6 (six) hours as needed. 01/18/20  Yes Avegno, Zachery Dakins, FNP  clindamycin (CLEOCIN) 300 MG capsule 1 capsule (300 mg total) four (4) times a day. 10/26/23  Yes [provider]  fluconazole (DIFLUCAN) 100 MG tablet Take 1 tablet by mouth daily. 10/24/23  Yes [provider]  insulin regular human CONCENTRATED (HUMULIN R U-500 KWIKPEN) 500 UNIT/ML kwikpen INJECT 45 UNITS AT BREAKFAST AND 35 UNITS AT LUNCH AND DINNER. Patient taking differently: 90-100 Units. INJECT 100 UNITS AT BREAKFAST AND 90 UNITS AT  DINNER. 08/09/19  Yes Reather Littler, MD  naproxen (NAPROSYN) 500 MG tablet Take by mouth. 10/29/23 10/28/24 Yes [provider]  ondansetron  (ZOFRAN) 4 MG tablet Take 1 tablet (4 mg total) by mouth every 8 (eight) hours as needed for nausea or vomiting. 04/05/19  Yes Reather Littler, MD  Continuous Blood Gluc Sensor (FREESTYLE LIBRE 14 DAY SENSOR) MISC 1 Units by Does not apply route every 14 (fourteen) days. 10/26/18   Reather Littler, MD  Insulin Pen Needle (PEN NEEDLES) 33G X 4 MM MISC 1 each by Other route See admin instructions. Use 1 pen needle to inject insulin 4 times daily. 01/27/19   Reather Littler, MD  OZEMPIC, 1 MG/DOSE, 4 MG/3ML SOPN Inject into the skin. Patient not taking: Reported on 10/30/2023 05/27/22   [provider]  pantoprazole (PROTONIX) 40 MG tablet Take 1 tablet every day by oral route.    [provider]  sulfamethoxazole-trimethoprim (BACTRIM DS) 800-160 MG tablet Take by mouth. Patient not taking: Reported on 10/30/2023 10/24/23   [provider]      VITAL SIGNS:  Blood pressure 107/65, pulse 76, temperature 98 F (36.7 C), temperature source Oral, resp. rate 18, height 5\' 6"  (1.676  m), weight 131.6 kg, last menstrual period 10/25/2023, SpO2 97%.  PHYSICAL EXAMINATION:  Physical Exam  GENERAL:  43 y.o.-year-old patient lying in the bed with no acute distress.  EYES: Pupils equal, round, reactive to light and accommodation. No scleral icterus. Extraocular muscles intact.  HEENT: Head atraumatic, normocephalic. Oropharynx and nasopharynx clear.  NECK:  Supple, no jugular venous distention. No thyroid enlargement, no tenderness.  LUNGS: Normal breath sounds bilaterally, no wheezing, rales,rhonchi or crepitation. No use of accessory muscles of respiration.  CARDIOVASCULAR: Regular rate and rhythm, S1, S2 normal. No murmurs, rubs, or gallops.  ABDOMEN: Soft, nondistended, nontender. Bowel sounds present. No organomegaly or mass.  EXTREMITIES: No pedal edema, cyanosis, or clubbing.  NEUROLOGIC: Cranial nerves II through XII are intact. Muscle strength 5/5 in all extremities. Sensation intact. Gait  not checked.  PSYCHIATRIC: The patient is alert and oriented x 3.  Normal affect and good eye contact. SKIN: Left gluteal warm tender indurated swelling 6 cm X 6 cm X 4 cm with purulent drainage.  Before drainage   After drainage LABORATORY PANEL:   CBC Recent Labs  Lab 10/31/23 0439  WBC 15.2*  HGB 12.3  HCT 38.7  PLT 271   ------------------------------------------------------------------------------------------------------------------  Chemistries  Recent Labs  Lab 10/30/23 1827 10/31/23 0439  NA 134* 134*  K 3.6 3.6  CL 97* 100  CO2 25 25  GLUCOSE 308* 356*  BUN 13 13  CREATININE 0.85 0.75  CALCIUM 9.1 8.6*  AST 17  --   ALT 15  --   ALKPHOS 73  --   BILITOT 0.6  --    ------------------------------------------------------------------------------------------------------------------  Cardiac Enzymes No results for input(s): "TROPONINI" in the last 168 hours. ------------------------------------------------------------------------------------------------------------------  RADIOLOGY:  CT PELVIS WO CONTRAST Result Date: 10/30/2023 CLINICAL DATA:  Abscess to the left buttocks x1 week. EXAM: CT PELVIS WITHOUT CONTRAST TECHNIQUE: Multidetector CT imaging of the pelvis was performed following the standard protocol without intravenous contrast. RADIATION DOSE REDUCTION: This exam was performed according to the departmental dose-optimization program which includes automated exposure control, adjustment of the mA and/or kV according to patient size and/or use of iterative reconstruction technique. COMPARISON:  None Available. FINDINGS: Urinary Tract:  No abnormality visualized. Bowel:  Unremarkable visualized pelvic bowel loops. Vascular/Lymphatic: No pathologically enlarged lymph nodes. No significant vascular abnormality seen. Reproductive: A 6.3 cm x 4.7 cm x 6.2 cm well-defined area of soft tissue attenuation (approximately 53.33 Hounsfield units) is seen within the right  adnexa. This represents a new finding when compared to the prior study. Other:  None. Musculoskeletal: Moderate severity left-sided perirectal inflammatory fat stranding is seen. This extends inferiorly along the left gluteal fold. A 6.5 mm x 12.6 mm x 11.9 mm ill-defined area of fluid attenuation (approximately 23.07 Hounsfield units) is seen within the perirectal region on the left (axial CT image 121, CT series 5/coronal reformatted image 110, CT series 8). No acute osseous abnormalities are identified. IMPRESSION: 1. Moderate severity left-sided perirectal inflammatory fat stranding with a small left perirectal abscess. 2. 6.3 cm x 4.7 cm x 6.2 cm well-defined area of soft tissue attenuation within the right adnexa. While this may represent a hemorrhagic cyst or exophytic uterine fibroid, further evaluation with pelvic ultrasound is recommended. Electronically Signed   By: Aram Candela M.D.   On: 10/30/2023 21:49      IMPRESSION AND PLAN:  Assessment and Plan: * Perirectal abscess - The patient has subsequent sepsis due to left gluteal cellulitis and abscess.  This is manifested  by leukocytosis as well as tachycardia. - The patient admitted to a medical-surgical bed. - She underwent incision and drainage. - She will have a general surgery consult. - Dr. Robyne Peers was notified about the patient. - Will continue antibiotic therapy with IV vancomycin, Cipro and Flagyl.  Uncontrolled type 2 diabetes mellitus with hyperglycemia, with long-term current use of insulin (HCC) - The patient will be placed on supplemental coverage with NovoLog and continued on her Humulin RU-500.  GERD without esophagitis - We will continue PPI therapy.   DVT prophylaxis: Lovenox.  Advanced Care Planning:  Code Status: full code.  Family Communication:  The plan of care was discussed in details with the patient (and family). I answered all questions. The patient agreed to proceed with the above mentioned  plan. Further management will depend upon hospital course. Disposition Plan: Back to previous home environment Consults called: General Surgery All the records are reviewed and case discussed with ED provider.  Status is: Inpatient    At the time of the admission, it appears that the appropriate admission status for this patient is inpatient.  This is judged to be reasonable and necessary in order to provide the required intensity of service to ensure the patient's safety given the presenting symptoms, physical exam findings and initial radiographic and laboratory data in the context of comorbid conditions.  The patient requires inpatient status due to high intensity of service, high risk of further deterioration and high frequency of surveillance required.  I certify that at the time of admission, it is my clinical judgment that the patient will require inpatient hospital care extending more than 2 midnights.                            Dispo: The patient is from: Home              Anticipated d/c is to: Home              Patient currently is not medically stable to d/c.              Difficult to place patient: No  Hannah Beat M.D on 10/31/2023 at 7:08 AM  [facility]   From 7 PM-7 AM, contact night-coverage [email or web address]  CC: Primary care physician; Elfredia Nevins, MD

## 2023-10-30 NOTE — ED Triage Notes (Signed)
 Pt c/o abscess to left buttocks that started last week. Pt was given Clindamycin for it by Dr. Sherwood Gambler but she reports it hasn't gotten any better. She saw a surgeon yesterday and they gave her Naproxen for the pain and set her up for an outpt CT tomorrow. Pt reports the pain is unbearable and she hasn't been able to sit down.

## 2023-10-30 NOTE — ED Provider Notes (Signed)
 [place] EMERGENCY DEPARTMENT AT [facility] Provider Note   CSN: [id number] Arrival date & time: 10/30/23  1426     History  Chief Complaint  Patient presents with   Abscess    Julie Berry is a 43 y.o. female.  Patient with history of diabetes, hypertension, morbid obesity presents today with complaints of abscess. She states that same is located in her left inner buttock and has been present for the past week. Denies history of similar previously. She saw surgery through [facility] yesterday who scheduled her to have an outpatient CT scan to look at it better. She states that her pain worsened today and presents for same. Denies fevers or chills. No abdominal pain.  The history is provided by the patient. No language interpreter was used.  Abscess      Home Medications Prior to Admission medications   Medication Sig Start Date End Date Taking? Authorizing Provider  acetaminophen (TYLENOL) 500 MG tablet Take 1 tablet (500 mg total) by mouth every 6 (six) hours as needed. 01/18/20  Yes Avegno, Zachery Dakins, FNP  clindamycin (CLEOCIN) 300 MG capsule 1 capsule (300 mg total) four (4) times a day. 10/26/23  Yes [provider]  fluconazole (DIFLUCAN) 100 MG tablet Take 1 tablet by mouth daily. 10/24/23  Yes [provider]  insulin regular human CONCENTRATED (HUMULIN R U-500 KWIKPEN) 500 UNIT/ML kwikpen INJECT 45 UNITS AT BREAKFAST AND 35 UNITS AT LUNCH AND DINNER. Patient taking differently: 90-100 Units. INJECT 100 UNITS AT BREAKFAST AND 90 UNITS AT  DINNER. 08/09/19  Yes Reather Littler, MD  naproxen (NAPROSYN) 500 MG tablet Take by mouth. 10/29/23 10/28/24 Yes [provider]  ondansetron (ZOFRAN) 4 MG tablet Take 1 tablet (4 mg total) by mouth every 8 (eight) hours as needed for nausea or vomiting. 04/05/19  Yes Reather Littler, MD  Continuous Blood Gluc Sensor (FREESTYLE LIBRE 14 DAY SENSOR) MISC 1 Units by Does not apply route every 14 (fourteen) days. 10/26/18    Reather Littler, MD  Insulin Pen Needle (PEN NEEDLES) 33G X 4 MM MISC 1 each by Other route See admin instructions. Use 1 pen needle to inject insulin 4 times daily. 01/27/19   Reather Littler, MD  OZEMPIC, 1 MG/DOSE, 4 MG/3ML SOPN Inject into the skin. Patient not taking: Reported on 10/30/2023 05/27/22   [provider]  pantoprazole (PROTONIX) 40 MG tablet Take 1 tablet every day by oral route.    [provider]  sulfamethoxazole-trimethoprim (BACTRIM DS) 800-160 MG tablet Take by mouth. Patient not taking: Reported on 10/30/2023 10/24/23   [provider]      Allergies    Amoxicillin, Penicillins, Dilaudid  [hydromorphone hcl], Meloxicam, Peanut (diagnostic), Contrast media [iodinated contrast media], Latex, and Trulicity [dulaglutide]    Review of Systems   Review of Systems  All other systems reviewed and are negative.   Physical Exam Updated Vital Signs BP 110/64   Pulse 87   Temp 98.2 F (36.8 C) (Oral)   Resp 17   Ht 5\' 6"  (1.676 m)   Wt 132.9 kg   LMP 10/25/2023   SpO2 98%   BMI 47.29 kg/m  Physical Exam Vitals and nursing note reviewed. Exam conducted with a chaperone present.  Constitutional:      General: She is not in acute distress.    Appearance: Normal appearance. She is normal weight. She is not ill-appearing, toxic-appearing or diaphoretic.  HENT:     Head: Normocephalic and atraumatic.  Cardiovascular:     Rate and Rhythm: Normal rate.  Pulmonary:     Effort: Pulmonary effort is normal. No respiratory distress.  Abdominal:     General: Abdomen is flat.     Palpations: Abdomen is soft.     Tenderness: There is no abdominal tenderness.  Genitourinary:    Comments: Small area of fluctuance with diffuse induration noted to he left perirectal region. No active drainage Musculoskeletal:        General: Normal range of motion.     Cervical back: Normal range of motion.  Skin:    General: Skin is warm and dry.     Comments: Left gluteal  abscess with fluctuance and surrounding induration. No active drainage. See images below for further  Neurological:     General: No focal deficit present.     Mental Status: She is alert.  Psychiatric:        Mood and Affect: Mood normal.        Behavior: Behavior normal.        ED Results / Procedures / Treatments   Labs (all labs ordered are listed, but only abnormal results are displayed) Labs Reviewed  CBC WITH DIFFERENTIAL/PLATELET - Abnormal; Notable for the following components:      Result Value   WBC 16.0 (*)    Neutro Abs 12.0 (*)    Monocytes Absolute 1.1 (*)    Abs Immature Granulocytes 0.10 (*)    All other components within normal limits  COMPREHENSIVE METABOLIC PANEL - Abnormal; Notable for the following components:   Sodium 134 (*)    Chloride 97 (*)    Glucose, Bld 308 (*)    All other components within normal limits  CBG MONITORING, ED - Abnormal; Notable for the following components:   Glucose-Capillary 294 (*)    All other components within normal limits  HCG, SERUM, QUALITATIVE    EKG None  Radiology CT PELVIS WO CONTRAST Result Date: 10/30/2023 CLINICAL DATA:  Abscess to the left buttocks x1 week. EXAM: CT PELVIS WITHOUT CONTRAST TECHNIQUE: Multidetector CT imaging of the pelvis was performed following the standard protocol without intravenous contrast. RADIATION DOSE REDUCTION: This exam was performed according to the departmental dose-optimization program which includes automated exposure control, adjustment of the mA and/or kV according to patient size and/or use of iterative reconstruction technique. COMPARISON:  None Available. FINDINGS: Urinary Tract:  No abnormality visualized. Bowel:  Unremarkable visualized pelvic bowel loops. Vascular/Lymphatic: No pathologically enlarged lymph nodes. No significant vascular abnormality seen. Reproductive: A 6.3 cm x 4.7 cm x 6.2 cm well-defined area of soft tissue attenuation (approximately 53.33 Hounsfield  units) is seen within the right adnexa. This represents a new finding when compared to the prior study. Other:  None. Musculoskeletal: Moderate severity left-sided perirectal inflammatory fat stranding is seen. This extends inferiorly along the left gluteal fold. A 6.5 mm x 12.6 mm x 11.9 mm ill-defined area of fluid attenuation (approximately 23.07 Hounsfield units) is seen within the perirectal region on the left (axial CT image 121, CT series 5/coronal reformatted image 110, CT series 8). No acute osseous abnormalities are identified. IMPRESSION: 1. Moderate severity left-sided perirectal inflammatory fat stranding with a small left perirectal abscess. 2. 6.3 cm x 4.7 cm x 6.2 cm well-defined area of soft tissue attenuation within the right adnexa. While this may represent a hemorrhagic cyst or exophytic uterine fibroid, further evaluation with pelvic ultrasound is recommended. Electronically Signed   By: Demetrius Revel.D.  On: 10/30/2023 21:49    Procedures .Incision and Drainage  Date/Time: 10/31/2023 12:50 AM  Performed by: Silva Bandy, PA-C Authorized by: Silva Bandy, PA-C   Consent:    Consent obtained:  Verbal   Consent given by:  Patient   Risks, benefits, and alternatives were discussed: yes     Risks discussed:  Bleeding, damage to other organs, infection, incomplete drainage and pain   Alternatives discussed:  No treatment, delayed treatment, alternative treatment, observation and referral Universal protocol:    Procedure explained and questions answered to patient or proxy's satisfaction: yes     Imaging studies available: yes     Patient identity confirmed:  Verbally with patient Location:    Type:  Abscess   Size:  6.5 mm x 12.6 mm x 11.9 mm   Location: perirectal. Pre-procedure details:    Skin preparation:  Povidone-iodine Anesthesia:    Anesthesia method:  Local infiltration   Local anesthetic:  Lidocaine 2% WITH epi Procedure type:    Complexity:   Simple Procedure details:    Incision types:  Cruciate   Wound management:  Probed and deloculated   Drainage:  Purulent and bloody   Drainage amount:  Copious   Wound treatment:  Wound left open   Packing materials:  None Post-procedure details:    Procedure completion:  Tolerated well, no immediate complications     Medications Ordered in ED Medications  oxyCODONE-acetaminophen (PERCOCET/ROXICET) 5-325 MG per tablet 1 tablet (1 tablet Oral Not Given 10/30/23 1901)  lidocaine-EPINEPHrine (XYLOCAINE W/EPI) 2 %-1:200000 (PF) injection 10 mL (has no administration in time range)  ciprofloxacin (CIPRO) IVPB 400 mg (has no administration in time range)  metroNIDAZOLE (FLAGYL) IVPB 500 mg (has no administration in time range)    ED Course/ Medical Decision Making/ A&P                                 Medical Decision Making Amount and/or Complexity of Data Reviewed Labs: ordered. Radiology: ordered.  Risk Prescription drug management. Decision regarding hospitalization.   This patient is a 43 y.o. female who presents to the ED for concern of abscess, this involves an extensive number of treatment options, and is a complaint that carries with it a high risk of complications and morbidity. The emergent differential diagnosis prior to evaluation includes, but is not limited to,  sepsis, necrotizing fascitis, abscess, fistula . This is not an exhaustive differential.   Past Medical History / Co-morbidities / Social History:  has a past medical history of Complication of anesthesia, Depressed, Diabetes mellitus without complication (HCC), Gallstones, HTN (hypertension), Hyperglycemia, Leukocytosis, Low back pain, Lump or mass in breast, Malaise and fatigue, Morbid obesity (HCC), Sciatica, Sleep apnea, and UTI (urinary tract infection).  Additional history: Chart reviewed. Pertinent results include: seen by surgery yesterday, no clean area to drain seen on physical exam. Plan was for  outpatient CT and I&D after if needed  Physical Exam: Physical exam performed. The pertinent findings include: 6.5 mm x 12.6 mm x 11.9 mm perirectal abscess. No active drainage  Lab Tests: I ordered, and personally interpreted labs.  The pertinent results include:  WBC 16.0, Na 134, glucose 308   Imaging Studies: I ordered imaging studies including CT pelvis. I independently visualized and interpreted imaging which showed    1. Moderate severity left-sided perirectal inflammatory fat stranding with a small left perirectal abscess. 2. 6.3 cm x 4.7  cm x 6.2 cm well-defined area of soft tissue attenuation within the right adnexa. While this may represent a hemorrhagic cyst or exophytic uterine fibroid, further evaluation with pelvic ultrasound is recommended.   I agree with the radiologist interpretation.  Ordered pelvic US for patient to have performed during her hospital stay   Medications: I ordered medication including cipro, flagyl, percocet  for perirectal abscess. Reevaluation of the patient after these medicines showed that the patient improved. I have reviewed the patients home medicines and have made adjustments as needed.  Consultations Obtained: I requested consultation with the general surgery on call Dr. Robyne Peers,  and discussed lab and imaging findings as well as pertinent plan - they recommend: attempt bedside I&D, admission for IV antibiotics, NPO at midnight for surgical consultation and    Disposition: After consideration of the diagnostic results and the patients response to treatment, I feel that patient will require admission for IV antibiotics and surgical consultation tomorrow. Bedside I&D performed by me per above procedure which was well tolerated with copious amount of purulent discharge, well tolerated with significant improvement in pain. Discussed admission plan with patient who is understanding and in agreement. Also ordered pelvic US to further evaluate  are seen on CT, will need this followed up as well.   Discussed admission with hospitalist Dr. Arville Care who accepts patient for admission.  I discussed this case with my attending physician Dr. Rhae Hammock who cosigned this note including patient's presenting symptoms, physical exam, and planned diagnostics and interventions. Attending physician stated agreement with plan or made changes to plan which were implemented.    Final Clinical Impression(s) / ED Diagnoses Final diagnoses:  Perirectal abscess  Abnormal CT scan, pelvis    Rx / DC Orders ED Discharge Orders     None         Vear Clock 10/31/23 0102    Durwin Glaze, MD 10/31/23 [phone]

## 2023-10-31 ENCOUNTER — Inpatient Hospital Stay (HOSPITAL_COMMUNITY)

## 2023-10-31 DIAGNOSIS — E1165 Type 2 diabetes mellitus with hyperglycemia: Secondary | ICD-10-CM | POA: Diagnosis not present

## 2023-10-31 DIAGNOSIS — E8881 Metabolic syndrome: Secondary | ICD-10-CM

## 2023-10-31 DIAGNOSIS — K219 Gastro-esophageal reflux disease without esophagitis: Secondary | ICD-10-CM | POA: Insufficient documentation

## 2023-10-31 DIAGNOSIS — K611 Rectal abscess: Secondary | ICD-10-CM | POA: Diagnosis not present

## 2023-10-31 LAB — GLUCOSE, CAPILLARY
Glucose-Capillary: 267 mg/dL — ABNORMAL HIGH (ref 70–99)
Glucose-Capillary: 163 mg/dL — ABNORMAL HIGH (ref 70–99)
Glucose-Capillary: 114 mg/dL — ABNORMAL HIGH (ref 70–99)

## 2023-10-31 LAB — HIV ANTIBODY (ROUTINE TESTING W REFLEX): HIV Screen 4th Generation wRfx: NONREACTIVE

## 2023-10-31 LAB — CBC
HCT: 38.7 % (ref 36.0–46.0)
Hemoglobin: 12.3 g/dL (ref 12.0–15.0)
MCH: 27.5 pg (ref 26.0–34.0)
MCHC: 31.8 g/dL (ref 30.0–36.0)
MCV: 86.4 fL (ref 80.0–100.0)
Platelets: 271 10*3/uL (ref 150–400)
RBC: 4.48 MIL/uL (ref 3.87–5.11)
RDW: 12.4 % (ref 11.5–15.5)
WBC: 15.2 10*3/uL — ABNORMAL HIGH (ref 4.0–10.5)
nRBC: 0 % (ref 0.0–0.2)

## 2023-10-31 LAB — PROTIME-INR
INR: 1.1 (ref 0.8–1.2)
Prothrombin Time: 14.7 s (ref 11.4–15.2)

## 2023-10-31 LAB — BASIC METABOLIC PANEL
Anion gap: 9 (ref 5–15)
BUN: 13 mg/dL (ref 6–20)
CO2: 25 mmol/L (ref 22–32)
Calcium: 8.6 mg/dL — ABNORMAL LOW (ref 8.9–10.3)
Chloride: 100 mmol/L (ref 98–111)
Creatinine, Ser: 0.75 mg/dL (ref 0.44–1.00)
GFR, Estimated: >60 mL/min (ref >60)
Glucose, Bld: 356 mg/dL — ABNORMAL HIGH (ref 70–99)
Potassium: 3.6 mmol/L (ref 3.5–5.1)
Sodium: 134 mmol/L — ABNORMAL LOW (ref 135–145)

## 2023-10-31 LAB — HEMOGLOBIN A1C
Hgb A1c MFr Bld: 14.8 % — ABNORMAL HIGH (ref 4.8–5.6)
Mean Plasma Glucose: 378.06 mg/dL

## 2023-10-31 LAB — CORTISOL-AM, BLOOD: Cortisol - AM: 6.2 ug/dL — ABNORMAL LOW (ref 6.7–22.6)

## 2023-10-31 MED ORDER — INSULIN REGULAR HUMAN (CONC) 500 UNIT/ML ~~LOC~~ SOPN
100.0000 [IU] | PEN_INJECTOR | Freq: Every day | SUBCUTANEOUS | Status: DC
  Administered 2023-10-31: 100 [IU] via SUBCUTANEOUS
  Filled 2023-10-31: qty 3

## 2023-10-31 MED ORDER — ACETAMINOPHEN 650 MG RE SUPP: 650.0000 mg | Freq: Four times a day (QID) | RECTAL | Status: DC | PRN

## 2023-10-31 MED ORDER — FENTANYL CITRATE PF 50 MCG/ML IJ SOSY
PREFILLED_SYRINGE | INTRAMUSCULAR | Status: AC
  Filled 2023-10-31: qty 1

## 2023-10-31 MED ORDER — INSULIN ASPART 100 UNIT/ML IJ SOLN
0.0000 [IU] | Freq: Three times a day (TID) | INTRAMUSCULAR | Status: DC
  Administered 2023-11-01: 3 [IU] via SUBCUTANEOUS
  Administered 2023-11-01: 15 [IU] via SUBCUTANEOUS
  Administered 2023-11-01: 3 [IU] via SUBCUTANEOUS
  Administered 2023-11-02: 7 [IU] via SUBCUTANEOUS
  Administered 2023-11-02: 4 [IU] via SUBCUTANEOUS

## 2023-10-31 MED ORDER — MAGNESIUM HYDROXIDE 400 MG/5ML PO SUSP: 30.0000 mL | Freq: Every day | ORAL | Status: DC | PRN

## 2023-10-31 MED ORDER — LACTATED RINGERS IV SOLN
150.0000 mL/h | INTRAVENOUS | Status: DC
Start: 2023-10-31 — End: 2023-11-01

## 2023-10-31 MED ORDER — VANCOMYCIN HCL IN DEXTROSE 1-5 GM/200ML-% IV SOLN: 1000.0000 mg | Freq: Two times a day (BID) | INTRAVENOUS | Status: DC

## 2023-10-31 MED ORDER — ACETAMINOPHEN 500 MG PO TABS
1000.0000 mg | ORAL_TABLET | Freq: Once | ORAL | Status: AC
  Administered 2023-10-31: 1000 mg via ORAL
  Filled 2023-10-31: qty 2

## 2023-10-31 MED ORDER — TRAMADOL HCL 50 MG PO TABS
50.0000 mg | ORAL_TABLET | Freq: Four times a day (QID) | ORAL | Status: DC | PRN
  Administered 2023-10-31 (×2): 50 mg via ORAL
  Filled 2023-10-31 (×2): qty 1

## 2023-10-31 MED ORDER — VANCOMYCIN HCL 1250 MG/250ML IV SOLN
1250.0000 mg | Freq: Two times a day (BID) | INTRAVENOUS | Status: DC
  Administered 2023-10-31 – 2023-11-02 (×4): 1250 mg via INTRAVENOUS
  Filled 2023-10-31 (×4): qty 250

## 2023-10-31 MED ORDER — CIPROFLOXACIN IN D5W 400 MG/200ML IV SOLN
400.0000 mg | Freq: Two times a day (BID) | INTRAVENOUS | Status: DC
  Administered 2023-10-31 – 2023-11-01 (×3): 400 mg via INTRAVENOUS
  Filled 2023-10-31 (×3): qty 200

## 2023-10-31 MED ORDER — ENOXAPARIN SODIUM 80 MG/0.8ML IJ SOSY
65.0000 mg | PREFILLED_SYRINGE | INTRAMUSCULAR | Status: DC
  Administered 2023-10-31 – 2023-11-02 (×3): 65 mg via SUBCUTANEOUS
  Filled 2023-10-31 (×3): qty 0.8

## 2023-10-31 MED ORDER — INSULIN REGULAR HUMAN (CONC) 500 UNIT/ML ~~LOC~~ SOPN: 90.0000 [IU] | PEN_INJECTOR | SUBCUTANEOUS | Status: DC

## 2023-10-31 MED ORDER — FENTANYL CITRATE PF 50 MCG/ML IJ SOSY
25.0000 ug | PREFILLED_SYRINGE | INTRAMUSCULAR | Status: DC | PRN
  Administered 2023-10-31: 25 ug via INTRAVENOUS
  Filled 2023-10-31: qty 1

## 2023-10-31 MED ORDER — TRAZODONE HCL 50 MG PO TABS
50.0000 mg | ORAL_TABLET | Freq: Every evening | ORAL | Status: DC | PRN
  Administered 2023-10-31: 50 mg via ORAL
  Filled 2023-10-31: qty 1

## 2023-10-31 MED ORDER — OXYCODONE HCL 5 MG PO TABS: 10.0000 mg | ORAL_TABLET | Freq: Four times a day (QID) | ORAL | Status: DC | PRN

## 2023-10-31 MED ORDER — ONDANSETRON HCL 4 MG/2ML IJ SOLN
4.0000 mg | INTRAMUSCULAR | Status: DC | PRN
  Administered 2023-10-31 – 2023-11-02 (×4): 4 mg via INTRAVENOUS
  Filled 2023-10-31 (×5): qty 2

## 2023-10-31 MED ORDER — PANTOPRAZOLE SODIUM 40 MG PO TBEC
40.0000 mg | DELAYED_RELEASE_TABLET | Freq: Every day | ORAL | Status: DC
  Administered 2023-11-01 – 2023-11-02 (×2): 40 mg via ORAL
  Filled 2023-10-31 (×3): qty 1

## 2023-10-31 MED ORDER — TRAZODONE HCL 50 MG PO TABS: 25.0000 mg | ORAL_TABLET | Freq: Every evening | ORAL | Status: DC | PRN

## 2023-10-31 MED ORDER — VANCOMYCIN HCL IN DEXTROSE 1-5 GM/200ML-% IV SOLN: 1000.0000 mg | Freq: Once | INTRAVENOUS | Status: DC

## 2023-10-31 MED ORDER — LACTATED RINGERS IV SOLN
150.0000 mL/h | INTRAVENOUS | Status: DC
  Administered 2023-10-31: 150 mL/h via INTRAVENOUS

## 2023-10-31 MED ORDER — INSULIN REGULAR HUMAN (CONC) 500 UNIT/ML ~~LOC~~ SOPN
90.0000 [IU] | PEN_INJECTOR | Freq: Every day | SUBCUTANEOUS | Status: DC
Start: 2023-10-31 — End: 2023-11-01
  Administered 2023-10-31: 60 [IU] via SUBCUTANEOUS
  Filled 2023-10-31: qty 3

## 2023-10-31 MED ORDER — ACETAMINOPHEN 325 MG PO TABS
650.0000 mg | ORAL_TABLET | Freq: Four times a day (QID) | ORAL | Status: DC | PRN
  Administered 2023-10-31 – 2023-11-02 (×5): 650 mg via ORAL
  Filled 2023-10-31 (×6): qty 2

## 2023-10-31 MED ORDER — METRONIDAZOLE 500 MG/100ML IV SOLN
500.0000 mg | Freq: Two times a day (BID) | INTRAVENOUS | Status: DC
  Administered 2023-10-31 – 2023-11-02 (×5): 500 mg via INTRAVENOUS
  Filled 2023-10-31 (×5): qty 100

## 2023-10-31 MED ORDER — VANCOMYCIN HCL 2000 MG/400ML IV SOLN
2000.0000 mg | Freq: Once | INTRAVENOUS | Status: AC
  Administered 2023-10-31: 2000 mg via INTRAVENOUS
  Filled 2023-10-31 (×2): qty 400

## 2023-10-31 NOTE — Inpatient Diabetes Management (Addendum)
 Inpatient Diabetes Program Recommendations  AACE/ADA: New Consensus Statement on Inpatient Glycemic Control   Target Ranges:  Prepandial:   less than 140 mg/dL      Peak postprandial:   less than 180 mg/dL (1-2 hours)      Critically ill patients:  140 - 180 mg/dL    Latest Reference Range & Units 10/30/23 18:27 10/31/23 04:39  Glucose 70 - 99 mg/dL 161 (H) 096 (H)    Latest Reference Range & Units 10/30/23 18:04  Glucose-Capillary 70 - 99 mg/dL 045 (H)    Review of Glycemic Control  Diabetes history: DM2 Outpatient Diabetes medications: Humulin R U500 100 units QAM, Humulin R U500 90 units QPM (per patient she uses the U500 on a sliding scale and adjust dose or skips at times based on glucose), Ozempic 1 mg Qweek Current orders for Inpatient glycemic control: Humulin R U500 90-100 units as directed  Inpatient Diabetes Program Recommendations:    Insulin: Patient is currently NPO and lab glucose 356 mg/dl this morning. May want to consider decreasing Humulin R U500 or discontinuing it and ordering basal insulin. Please consider ordering CBGs Q4H and Novolog 0-20 units Q4H.  NOTE: Patient admitted with perirectal abscess and initial lab glucose 308 mg/dl and noted to be 409 mg/dl today at 8:11 am. In reviewing chart, noted patient sees [facility] and was seen by DR. Gorris on 03/31/23. Per office note on 03/31/23, patient was not wearing Dexcom and not checking glucsoe regularly; patient had been trying to take less insulin because of gaining weigh with higher doses of insulin; patient was asked to take Humulin R U500 80 units BID, stop Ozempic and start Mounjaro 5 mg Qweek (increase to 10 mg Qweek), and resume Dexcom G7 CGM.    Addendum 10/31/23@8 :15-Spoke with patient over the phone. Patient states that she is taking Humulin R U500 100 units QAM, Humulin R U500 90 units QPM (per patient she uses the U500 on a sliding scale and adjust dose or skips at times based on  glucose), and Ozempic 1 mg Qweek for DM. Patient states that if her glucose is running low in the 100's she takes less Humulin R U500 or even sometimes skips the dose. Patient states that she uses a Dexcom G7 CGM sometimes and sometimes she does finger sticks for glucose monitoring. Patient reports that she just recently got restarted on Ozempic because the pharmacy was out of stock. Patient states that her glucose has been running higher since she has had the abscess and pain. Patient states she did not take any Humulin R U500 yesterday. Discussed glucose of 356 mg/dl this morning and that B147 is ordered as 90-100 units as directed. Informed patient that since she was currently NPO, U500 dose may need to be decreased to stopped and other basal insulin ordered along with Novolog correction scale. Informed patient that hospitalist would be reviewing orders and possibly making changes.Patient verbalized understanding and has no questions at this time.  Thanks, Orlando Penner, RN, MSN, CDCES Diabetes Coordinator Inpatient Diabetes Program [phone] (Team Pager from 8am to 5pm)

## 2023-10-31 NOTE — Consult Note (Addendum)
 [facility] Consult  Reason for Consult: Perirectal abscess Referring Physician: Lurena Nida, PA-C  Chief Complaint   Abscess     HPI: Julie Berry is a 43 y.o. female who presents with a 1 week history of abscess to her left buttock.  She was evaluated at her PCPs office, who referred her to surgery at [facility].  She was evaluated by Dr. Marcha Solders, who recommended patient obtain a CT of the pelvis to fully evaluate for abscess.  Patient continued to have severe pain associated with the abscess, that she presented to the emergency department.  She has had decreased appetite secondary to the pain.  She has never had an abscess anywhere on her body previously.  Her past medical history significant for diabetes, for which she takes insulin.  Her surgical history significant for cholecystectomy.  In the ED, the patient was hemodynamically stable and afebrile.  She had a leukocytosis of 16.  CT of the pelvis demonstrated moderate severity left-sided perirectal inflammatory fat stranding with a small left perirectal abscess, 6.5 mm x 12.6 mm x 11.9 mm.  She underwent incision and drainage by ED provider, at which time copious purulent drainage was noted per ED note.  It does not appear wound culture was obtained, and there is no packing present in the wound currently.  Patient states that she is feeling better since the incision and drainage.  Her pain has slightly improved.  She denied any nausea or vomiting at home prior to coming to the hospital, but she did feel nauseous early this morning.  She was given an antiemetic which assisted with her nausea.  Past Medical History:  Diagnosis Date   Complication of anesthesia    heartrate dropped with anesthesia   Depressed    Diabetes mellitus without complication (HCC)    Gallstones    HTN (hypertension)    pt denies   Hyperglycemia    Leukocytosis    Low back pain    Lump or mass in breast    Malaise and fatigue     Morbid obesity (HCC)    Sciatica    Sleep apnea    UTI (urinary tract infection)     Past Surgical History:  Procedure Laterality Date   CHOLECYSTECTOMY  06/22/2012   Procedure: LAPAROSCOPIC CHOLECYSTECTOMY;  Surgeon: Dalia Heading, MD;  Location: [facility];  Service: General;  Laterality: N/A;   ESOPHAGOGASTRODUODENOSCOPY N/A 06/09/2014   Procedure: ESOPHAGOGASTRODUODENOSCOPY (EGD);  Surgeon: Malissa Hippo, MD;  Location: [facility] SUITE;  Service: Endoscopy;  Laterality: N/A;  200-moved to 145 Ann to notify pt    Family History  Problem Relation Age of Onset   Diabetes Mother    Rheumatic fever Mother 27   Migraines Father 7   Hypertension Father    Autism Brother    Heart disease Neg Hx     Social History   Tobacco Use   Smoking status: Never   Smokeless tobacco: Never  Vaping Use   Vaping status: Never Used  Substance Use Topics   Alcohol use: No   Drug use: No    Medications: I have reviewed the patient's current medications.  Allergies  Allergen Reactions   Amoxicillin Anaphylaxis and Other (See Comments)    Has patient had a PCN reaction causing immediate rash, facial/tongue/throat swelling, SOB or lightheadedness with hypotension: Yes Has patient had a PCN reaction causing severe rash involving mucus membranes or skin necrosis: No Has patient had a PCN reaction that  required hospitalization No Has patient had a PCN reaction occurring within the last 10 years: No If all of the above answers are "NO", then may proceed with Cephalosporin use.   Penicillins Anaphylaxis and Other (See Comments)    Has patient had a PCN reaction causing immediate rash, facial/tongue/throat swelling, SOB or lightheadedness with hypotension: Yes Has patient had a PCN reaction causing severe rash involving mucus membranes or skin necrosis: No Has patient had a PCN reaction that required hospitalization No Has patient had a PCN reaction occurring within the last 10 years: No If all of  the above answers are "NO", then may proceed with Cephalosporin use.   Dilaudid  [Hydromorphone Hcl]    Meloxicam Other (See Comments)    Caused migraine headache   Peanut (Diagnostic)    Contrast Media [Iodinated Contrast Media] Hives    CT Contrast, had hives for 3 days after cardiac CT   Latex Rash   Trulicity [Dulaglutide] Nausea Only and Palpitations     ROS:  Pertinent items are noted in HPI.  Blood pressure 107/65, pulse 76, temperature 98 F (36.7 C), temperature source Oral, resp. rate 18, height 5\' 6"  (1.676 m), weight 131.6 kg, last menstrual period 10/25/2023, SpO2 97%. Physical Exam Vitals reviewed.  Constitutional:      Appearance: Normal appearance. She is obese.  HENT:     Head: Normocephalic and atraumatic.  Eyes:     Extraocular Movements: Extraocular movements intact.     Pupils: Pupils are equal, round, and reactive to light.  Cardiovascular:     Rate and Rhythm: Normal rate.  Pulmonary:     Effort: Pulmonary effort is normal.  Abdominal:     General: There is no distension.     Palpations: Abdomen is soft.     Tenderness: There is no abdominal tenderness.  Genitourinary:    Comments: With the patient in a left lateral decubitus position, she had an incision about 2.5 to 3 cm in the length at the 6 o'clock position on her left buttock, 3-4 cm from the anal verge, surrounding erythema and induration, minimal tenderness around anus, bloody purulent drainage noted from I&D site, no additional areas of fluctuance palpated Musculoskeletal:        General: Normal range of motion.     Cervical back: Normal range of motion.  Skin:    General: Skin is warm and dry.  Neurological:     General: No focal deficit present.     Mental Status: She is alert and oriented to person, place, and time.  Psychiatric:        Mood and Affect: Mood normal.        Behavior: Behavior normal.     Results: Results for orders placed or performed during the hospital encounter  of 10/30/23 (from the past 48 hours)  CBG monitoring, ED     Status: Abnormal   Collection Time: 10/30/23  6:04 PM  Result Value Ref Range   Glucose-Capillary 294 (H) 70 - 99 mg/dL    Comment: Glucose reference range applies only to samples taken after fasting for at least 8 hours.  CBC with Differential     Status: Abnormal   Collection Time: 10/30/23  6:27 PM  Result Value Ref Range   WBC 16.0 (H) 4.0 - 10.5 K/uL   RBC 5.02 3.87 - 5.11 MIL/uL   Hemoglobin 13.9 12.0 - 15.0 g/dL   HCT 16.1 09.6 - 04.5 %   MCV 85.7 80.0 - 100.0 fL  MCH 27.7 26.0 - 34.0 pg   MCHC 32.3 30.0 - 36.0 g/dL   RDW 16.1 09.6 - 04.5 %   Platelets 307 150 - 400 K/uL   nRBC 0.0 0.0 - 0.2 %   Neutrophils Relative % 75 %   Neutro Abs 12.0 (H) 1.7 - 7.7 K/uL   Lymphocytes Relative 14 %   Lymphs Abs 2.3 0.7 - 4.0 K/uL   Monocytes Relative 7 %   Monocytes Absolute 1.1 (H) 0.1 - 1.0 K/uL   Eosinophils Relative 3 %   Eosinophils Absolute 0.5 0.0 - 0.5 K/uL   Basophils Relative 0 %   Basophils Absolute 0.1 0.0 - 0.1 K/uL   Immature Granulocytes 1 %   Abs Immature Granulocytes 0.10 (H) 0.00 - 0.07 K/uL    Comment: Performed at [facility], [address], [place], [place] [address]  Comprehensive metabolic panel     Status: Abnormal   Collection Time: 10/30/23  6:27 PM  Result Value Ref Range   Sodium 134 (L) 135 - 145 mmol/L   Potassium 3.6 3.5 - 5.1 mmol/L   Chloride 97 (L) 98 - 111 mmol/L   CO2 25 22 - 32 mmol/L   Glucose, Bld 308 (H) 70 - 99 mg/dL    Comment: Glucose reference range applies only to samples taken after fasting for at least 8 hours.   BUN 13 6 - 20 mg/dL   Creatinine, Ser 1.91 0.44 - 1.00 mg/dL   Calcium 9.1 8.9 - 47.8 mg/dL   Total Protein 7.8 6.5 - 8.1 g/dL   Albumin 3.5 3.5 - 5.0 g/dL   AST 17 15 - 41 U/L   ALT 15 0 - 44 U/L   Alkaline Phosphatase 73 38 - 126 U/L   Total Bilirubin 0.6 0.0 - 1.2 mg/dL   GFR, Estimated >29 >56 mL/min    Comment: (NOTE) Calculated using the CKD-EPI  Creatinine Equation (2021)    Anion gap 12 5 - 15    Comment: Performed at [facility], [address] [place], [place] [address]  hCG, serum, qualitative     Status: None   Collection Time: 10/30/23  6:27 PM  Result Value Ref Range   Preg, Serum NEGATIVE NEGATIVE    Comment:        THE SENSITIVITY OF THIS METHODOLOGY IS >10 mIU/mL. Performed at [facility], [address], [place], [place] [address]   Protime-INR     Status: None   Collection Time: 10/31/23  4:39 AM  Result Value Ref Range   Prothrombin Time 14.7 11.4 - 15.2 seconds   INR 1.1 0.8 - 1.2    Comment: (NOTE) INR goal varies based on device and disease states. Performed at [facility], [address] [place], [place] [address]   Cortisol-am, blood     Status: Abnormal   Collection Time: 10/31/23  4:39 AM  Result Value Ref Range   Cortisol - AM 6.2 (L) 6.7 - 22.6 ug/dL    Comment: Performed at [facility], [address] [place], [place] [address]  Basic metabolic panel     Status: Abnormal   Collection Time: 10/31/23  4:39 AM  Result Value Ref Range   Sodium 134 (L) 135 - 145 mmol/L   Potassium 3.6 3.5 - 5.1 mmol/L   Chloride 100 98 - 111 mmol/L   CO2 25 22 - 32 mmol/L   Glucose, Bld 356 (H) 70 - 99 mg/dL    Comment: Glucose reference range  applies only to samples taken after fasting for at least 8 hours.   BUN 13 6 - 20 mg/dL   Creatinine, Ser 1.61 0.44 - 1.00 mg/dL   Calcium 8.6 (L) 8.9 - 10.3 mg/dL   GFR, Estimated >09 >60 mL/min    Comment: (NOTE) Calculated using the CKD-EPI Creatinine Equation (2021)    Anion gap 9 5 - 15    Comment: Performed at [facility], [address] [place], [place] [address]  CBC     Status: Abnormal   Collection Time: 10/31/23  4:39 AM  Result Value Ref Range   WBC 15.2 (H) 4.0 - 10.5 K/uL   RBC 4.48 3.87 - 5.11 MIL/uL   Hemoglobin 12.3 12.0 - 15.0 g/dL   HCT 81.1 91.4 - 78.2 %   MCV 86.4 80.0 - 100.0 fL   MCH 27.5 26.0 - 34.0 pg   MCHC 31.8 30.0 - 36.0  g/dL   RDW 95.6 21.3 - 08.6 %   Platelets 271 150 - 400 K/uL   nRBC 0.0 0.0 - 0.2 %    Comment: Performed at [facility], [address], [place], [place] [address]    CT PELVIS WO CONTRAST Result Date: 10/30/2023 CLINICAL DATA:  Abscess to the left buttocks x1 week. EXAM: CT PELVIS WITHOUT CONTRAST TECHNIQUE: Multidetector CT imaging of the pelvis was performed following the standard protocol without intravenous contrast. RADIATION DOSE REDUCTION: This exam was performed according to the departmental dose-optimization program which includes automated exposure control, adjustment of the mA and/or kV according to patient size and/or use of iterative reconstruction technique. COMPARISON:  None Available. FINDINGS: Urinary Tract:  No abnormality visualized. Bowel:  Unremarkable visualized pelvic bowel loops. Vascular/Lymphatic: No pathologically enlarged lymph nodes. No significant vascular abnormality seen. Reproductive: A 6.3 cm x 4.7 cm x 6.2 cm well-defined area of soft tissue attenuation (approximately 53.33 Hounsfield units) is seen within the right adnexa. This represents a new finding when compared to the prior study. Other:  None. Musculoskeletal: Moderate severity left-sided perirectal inflammatory fat stranding is seen. This extends inferiorly along the left gluteal fold. A 6.5 mm x 12.6 mm x 11.9 mm ill-defined area of fluid attenuation (approximately 23.07 Hounsfield units) is seen within the perirectal region on the left (axial CT image 121, CT series 5/coronal reformatted image 110, CT series 8). No acute osseous abnormalities are identified. IMPRESSION: 1. Moderate severity left-sided perirectal inflammatory fat stranding with a small left perirectal abscess. 2. 6.3 cm x 4.7 cm x 6.2 cm well-defined area of soft tissue attenuation within the right adnexa. While this may represent a hemorrhagic cyst or exophytic uterine fibroid, further evaluation with pelvic ultrasound is recommended.  Electronically Signed   By: Aram Candela M.D.   On: 10/30/2023 21:49     Assessment & Plan:  Julie Berry is a 42 y.o. female who was admitted with a left buttock perirectal abscess status post bedside incision and drainage in the ED on 3/14.  Imaging and blood work evaluated by myself.  -I discussed the pathophysiology of perirectal abscesses with the patient.  Discussed that this could be the presentation of a fistula.  Further discussed that the surrounding pain and induration may take several weeks to fully improve -On physical exam, the area appears to be adequately drained without any additional areas of fluctuance that would require further incision and drainage -Wound culture obtained from I&D site -Incision and drainage site was then irrigated and packed with gauze roll -Will place nursing  wound care orders.  Recommend irrigation of the I&D site with 20 cc of saline and then packing the site with small gauze roll.  Can cover I&D site with piece of gauze.  -Recommend using as needed fentanyl prior to dressing change to assist with pain control -Patient with mild improvement of her leukocytosis to 15.2 from 16.  Continue to monitor with daily labs -Patient currently receiving ciprofloxacin, Flagyl, and vancomycin.  Would continue with broad-spectrum antibiotics until culture results -Okay for diet from surgical standpoint -Will monitor incision and drainage site over the weekend, and if patient fails to improve, would consider formal incision and drainage in the operating room -PRN pain control and antiemetics -Appreciate hospitalist recommendations, especially with hyperglycemia  All questions were answered to the satisfaction of the patient.  Note: Portions of this report may have been transcribed using voice recognition software. Every effort has been made to ensure accuracy; however, inadvertent computerized transcription errors may still be present.   -- Theophilus Kinds, DO [facility] [address] [place], [place] [address] [phone] (office)

## 2023-10-31 NOTE — Plan of Care (Signed)
  Problem: Education: Goal: Knowledge of General Education information will improve Description: Including pain rating scale, medication(s)/side effects and non-pharmacologic comfort measures Outcome: Progressing   Problem: Clinical Measurements: Goal: Ability to maintain clinical measurements within normal limits will improve Outcome: Progressing   Problem: Activity: Goal: Risk for activity intolerance will decrease Outcome: Progressing   Problem: Nutrition: Goal: Adequate nutrition will be maintained Outcome: Progressing   Problem: Coping: Goal: Level of anxiety will decrease Outcome: Progressing   Problem: Pain Managment: Goal: General experience of comfort will improve and/or be controlled Outcome: Progressing

## 2023-10-31 NOTE — Progress Notes (Signed)
 Pharmacy Antibiotic Note  Julie Berry is a 43 y.o. female admitted on 10/30/2023 with cellulitis.  Pharmacy has been consulted for vancomycin dosing.  Plan: Vancomycin 2000mg  x1 followed by 1000mg  IV Q12H. Goal AUC 400-550.  Expected AUC 430.  Height: 5\' 6"  (167.6 cm) Weight: 131.6 kg (290 lb 2 oz) IBW/kg (Calculated) : 59.3  Temp (24hrs), Avg:98 F (36.7 C), Min:97.8 F (36.6 C), Max:98.2 F (36.8 C)  Recent Labs  Lab 10/30/23 1827  WBC 16.0*  CREATININE 0.85    Estimated Creatinine Clearance: 117.6 mL/min (by C-G formula based on SCr of 0.85 mg/dL).    Allergies  Allergen Reactions   Amoxicillin Anaphylaxis and Other (See Comments)    Has patient had a PCN reaction causing immediate rash, facial/tongue/throat swelling, SOB or lightheadedness with hypotension: Yes Has patient had a PCN reaction causing severe rash involving mucus membranes or skin necrosis: No Has patient had a PCN reaction that required hospitalization No Has patient had a PCN reaction occurring within the last 10 years: No If all of the above answers are "NO", then may proceed with Cephalosporin use.   Penicillins Anaphylaxis and Other (See Comments)    Has patient had a PCN reaction causing immediate rash, facial/tongue/throat swelling, SOB or lightheadedness with hypotension: Yes Has patient had a PCN reaction causing severe rash involving mucus membranes or skin necrosis: No Has patient had a PCN reaction that required hospitalization No Has patient had a PCN reaction occurring within the last 10 years: No If all of the above answers are "NO", then may proceed with Cephalosporin use.   Dilaudid  [Hydromorphone Hcl]    Meloxicam Other (See Comments)    Caused migraine headache   Peanut (Diagnostic)    Contrast Media [Iodinated Contrast Media] Hives    CT Contrast, had hives for 3 days after cardiac CT   Latex Rash   Trulicity [Dulaglutide] Nausea Only and Palpitations    Thank you for  allowing pharmacy to be a part of this patient's care.  Vernard Gambles, PharmD, BCPS  10/31/2023 4:35 AM

## 2023-10-31 NOTE — Assessment & Plan Note (Signed)
 -  We will continue PPI therapy

## 2023-10-31 NOTE — TOC CM/SW Note (Signed)
 Transition of Care [facility]) - Inpatient Brief Assessment   Patient Details  Name: Julie Berry MRN: [id number] Date of Birth: March 19, 1981  [facility]) CM/SW Contact:    Isabella Bowens, LCSWA Phone Number: 10/31/2023, 8:13 AM   Clinical Narrative:  Transition of Care Department [facility]) has reviewed patient and no TOC needs have been identified at this time. We will continue to monitor patient advancement through interdisciplinary progression rounds. If new patient transition needs arise, please place a TOC consult.  Transition of Care Asessment: Insurance and Status: Insurance coverage has been reviewed Patient has primary care physician: Yes Home environment has been reviewed: Single Family Home Prior level of function:: Independent Prior/Current Home Services: No current home services Social Drivers of Health Review: SDOH reviewed no interventions necessary Readmission risk has been reviewed: Yes Transition of care needs: no transition of care needs at this time

## 2023-10-31 NOTE — Assessment & Plan Note (Addendum)
-   The patient has subsequent sepsis due to left gluteal cellulitis and abscess.  This is manifested by leukocytosis as well as tachycardia. - The patient admitted to a medical-surgical bed. - She underwent incision and drainage. - She will have a general surgery consult. - Dr. Robyne Peers was notified about the patient. - Will continue antibiotic therapy with IV vancomycin, Cipro and Flagyl.

## 2023-10-31 NOTE — ED Notes (Signed)
 This RN contacted MD Mansy regarding the pts nausea and trying to get some medication for her.

## 2023-10-31 NOTE — ED Notes (Signed)
 Assisting PA with I and D procedure.

## 2023-10-31 NOTE — Assessment & Plan Note (Addendum)
-   The patient will be placed on supplemental coverage with NovoLog and continued on her Humulin RU-500.

## 2023-10-31 NOTE — Progress Notes (Signed)
 PROGRESS NOTE   Julie Berry  UJW:[id number] DOB: 1981-03-31 DOA: 10/30/2023 PCP: Elfredia Nevins, MD   Chief Complaint  Patient presents with   Abscess   Level of care: Med-Surg  Brief Admission History:  43 y.o.female with medical history significant for type diabetes mellitus, hypertension, dyslipidemia, OSA, who presented to emergency room with acute onset of left gluteal pain with swelling, warmth.  It has become throbbing before she came to the ER.  She denied any fever or chills.  No nausea or vomiting or abdominal pain.  No blood glucose levels have been elevated.  No chest pain or palpitations.  No dysuria, oliguria or hematuria or flank pain.   ED Course: When she came to the ER, vital signs were within normal.  Labs revealed Hyponatremia 134 with hypochloremia of 97 and blood glucose of 308 with otherwise unremarkable CMP.  CBC showed leukocytosis of 16 with neutrophilia.  Urine pregnancy test was negative. Imaging: Pelvic CT without contrast revealed the following:1. Moderate severity left-sided perirectal inflammatory fat stranding with a small left perirectal abscess. 2. 6.3 cm x 4.7 cm x 6.2 cm well-defined area of soft tissue attenuation within the right adnexa. While this may represent a hemorrhagic cyst or exophytic uterine fibroid, further evaluation with pelvic ultrasound is recommended.   The patient was given IV vanc, Cipro and Flagyl, a gram of p.o. Tylenol 1 p.o. Percocet, and lidocaine with epi and had a I&D by the ER provider.  Consultation with Dr. Robyne Peers who recommended admission for further IV antibiotic therapy and surgical consultation.  She will be admitted to a medical bed for further evaluation and management.   Assessment and Plan:  Perirectal Left Gluteal abscess - The patient presented with sepsis.   - She underwent incision and drainage in ED on 3/13. - appreciate general surgery consult, will monitor over next couple days to see if it continues  to drain  - Continue antibiotic therapy with IV vancomycin, Cipro and Flagyl.  GERD without esophagitis - We will continue PPI therapy.  Uncontrolled type 2 diabetes mellitus with hyperglycemia, with long-term current use of insulin  Severe Insulin Resistance  - Continue U500 insulin with breakfast and supper, per pharm D, takes 100 units in AM and 50 units with supper. Follow CBG at least 5x per day; Carb modified diet; Titrate doses as needed when more glucose data available   DVT prophylaxis: enoxaparin Code Status: Full  Family Communication:  Disposition: continue inpatient care    Consultants:  Surgeon  Procedures:  I&D on 10/30/23   Antimicrobials:  Vanc 3/13>> Cipro 3/13>> Metronidazole 3/13>>   Subjective: Pt reports ongoing drainage from incised abscess.   Objective: Vitals:   10/31/23 0130 10/31/23 0222 10/31/23 0614 10/31/23 1348  BP: 119/69 116/80 107/65 111/76  Pulse: 82  76 80  Resp: 17 16 18 17   Temp:  98 F (36.7 C) 98 F (36.7 C) 98.2 F (36.8 C)  TempSrc:  Oral Oral   SpO2: 96% 98% 97% 100%  Weight:  131.6 kg    Height:  5\' 6"  (1.676 m)      Intake/Output Summary (Last 24 hours) at 10/31/2023 1438 Last data filed at 10/31/2023 0900 Gross per 24 hour  Intake 292.22 ml  Output --  Net 292.22 ml   Filed Weights   10/30/23 1452 10/31/23 0222  Weight: 132.9 kg 131.6 kg   Examination:  General exam: Appears calm and moderately uncomfortable, m.o. Respiratory system: Clear to auscultation. Respiratory effort normal.  Cardiovascular system: normal S1 & S2 heard. No JVD, murmurs, rubs, gallops or clicks. No pedal edema. Gastrointestinal system: Abdomen is nondistended, soft and nontender. No organomegaly or masses felt. Normal bowel sounds heard. Central nervous system: Alert and oriented. No focal neurological deficits. Extremities: Symmetric 5 x 5 power. Skin: abscess as seen with drainage  Psychiatry: Judgement and insight appear normal. Mood  & affect appropriate.   Data Reviewed: I have personally reviewed following labs and imaging studies  CBC: Recent Labs  Lab 10/30/23 1827 10/31/23 0439  WBC 16.0* 15.2*  NEUTROABS 12.0*  --   HGB 13.9 12.3  HCT 43.0 38.7  MCV 85.7 86.4  PLT 307 271    Basic Metabolic Panel: Recent Labs  Lab 10/30/23 1827 10/31/23 0439  NA 134* 134*  K 3.6 3.6  CL 97* 100  CO2 25 25  GLUCOSE 308* 356*  BUN 13 13  CREATININE 0.85 0.75  CALCIUM 9.1 8.6*    CBG: Recent Labs  Lab 10/30/23 1804 10/31/23 1138  GLUCAP 294* 267*    No results found for this or any previous visit (from the past 240 hours).   Radiology Studies: CT PELVIS WO CONTRAST Result Date: 10/30/2023 CLINICAL DATA:  Abscess to the left buttocks x1 week. EXAM: CT PELVIS WITHOUT CONTRAST TECHNIQUE: Multidetector CT imaging of the pelvis was performed following the standard protocol without intravenous contrast. RADIATION DOSE REDUCTION: This exam was performed according to the departmental dose-optimization program which includes automated exposure control, adjustment of the mA and/or kV according to patient size and/or use of iterative reconstruction technique. COMPARISON:  None Available. FINDINGS: Urinary Tract:  No abnormality visualized. Bowel:  Unremarkable visualized pelvic bowel loops. Vascular/Lymphatic: No pathologically enlarged lymph nodes. No significant vascular abnormality seen. Reproductive: A 6.3 cm x 4.7 cm x 6.2 cm well-defined area of soft tissue attenuation (approximately 53.33 Hounsfield units) is seen within the right adnexa. This represents a new finding when compared to the prior study. Other:  None. Musculoskeletal: Moderate severity left-sided perirectal inflammatory fat stranding is seen. This extends inferiorly along the left gluteal fold. A 6.5 mm x 12.6 mm x 11.9 mm ill-defined area of fluid attenuation (approximately 23.07 Hounsfield units) is seen within the perirectal region on the left (axial CT  image 121, CT series 5/coronal reformatted image 110, CT series 8). No acute osseous abnormalities are identified. IMPRESSION: 1. Moderate severity left-sided perirectal inflammatory fat stranding with a small left perirectal abscess. 2. 6.3 cm x 4.7 cm x 6.2 cm well-defined area of soft tissue attenuation within the right adnexa. While this may represent a hemorrhagic cyst or exophytic uterine fibroid, further evaluation with pelvic ultrasound is recommended. Electronically Signed   By: Aram Candela M.D.   On: 10/30/2023 21:49    Scheduled Meds:  enoxaparin (LOVENOX) injection  65 mg Subcutaneous Q24H   fentaNYL       insulin aspart  0-20 Units Subcutaneous TID WC   insulin regular human CONCENTRATED  100 Units Subcutaneous Q breakfast   insulin regular human CONCENTRATED  90 Units Subcutaneous Q supper   pantoprazole  40 mg Oral Daily   Continuous Infusions:  ciprofloxacin 400 mg (10/31/23 1242)   lactated ringers     metronidazole 500 mg (10/31/23 1231)   vancomycin      LOS: 1 day   Time spent: 56 mins  Alanta Scobey Laural Benes, MD How to contact the [facility] Attending or Consulting provider 7A - 7P or covering provider during after hours 7P -7A, for  this patient?  Check the care team in [facility] and look for a) attending/consulting [facility] provider listed and b) the [facility] team listed Log into [email or web address] to find provider on call.  Locate the [facility] provider you are looking for under [facility] and page to a number that you can be directly reached. If you still have difficulty reaching the provider, please page the [facility] (Director on Call) for the Hospitalists listed on amion for assistance.  10/31/2023, 2:38 PM

## 2023-10-31 NOTE — Plan of Care (Signed)

## 2023-10-31 NOTE — ED Notes (Signed)
 Pt asked for pain medication 5/10 this RN made MD [facility] aware.

## 2023-10-31 NOTE — ED Notes (Signed)
 This RN made MD Mansy aware that the pt is nauseous and would like something for it.

## 2023-10-31 NOTE — Hospital Course (Addendum)
 43 y.o.female with medical history significant for type diabetes mellitus, hypertension, dyslipidemia, OSA, who presented to emergency room with acute onset of left gluteal pain with swelling, warmth.  It has become throbbing before she came to the ER.  She denied any fever or chills.  No nausea or vomiting or abdominal pain.  No blood glucose levels have been elevated.  No chest pain or palpitations.  No dysuria, oliguria or hematuria or flank pain.   ED Course: When she came to the ER, vital signs were within normal.  Labs revealed Hyponatremia 134 with hypochloremia of 97 and blood glucose of 308 with otherwise unremarkable CMP.  CBC showed leukocytosis of 16 with neutrophilia.  Urine pregnancy test was negative. Imaging: Pelvic CT without contrast revealed the following:1. Moderate severity left-sided perirectal inflammatory fat stranding with a small left perirectal abscess. 2. 6.3 cm x 4.7 cm x 6.2 cm well-defined area of soft tissue attenuation within the right adnexa. While this may represent a hemorrhagic cyst or exophytic uterine fibroid, further evaluation with pelvic ultrasound is recommended.   The patient was given IV vanc, Cipro and Flagyl, a gram of p.o. Tylenol 1 p.o. Percocet, and lidocaine with epi and had a I&D by the ER provider.  Consultation with Dr. Robyne Peers who recommended admission for further IV antibiotic therapy and surgical consultation.  She will be admitted to a medical bed for further evaluation and management.

## 2023-11-01 DIAGNOSIS — K611 Rectal abscess: Secondary | ICD-10-CM | POA: Diagnosis not present

## 2023-11-01 DIAGNOSIS — E1165 Type 2 diabetes mellitus with hyperglycemia: Secondary | ICD-10-CM | POA: Diagnosis not present

## 2023-11-01 DIAGNOSIS — E8881 Metabolic syndrome: Secondary | ICD-10-CM | POA: Diagnosis not present

## 2023-11-01 DIAGNOSIS — Z794 Long term (current) use of insulin: Secondary | ICD-10-CM | POA: Diagnosis not present

## 2023-11-01 LAB — CBC WITH DIFFERENTIAL/PLATELET
Abs Immature Granulocytes: 0.08 10*3/uL — ABNORMAL HIGH (ref 0.00–0.07)
Basophils Absolute: 0.1 10*3/uL (ref 0.0–0.1)
Basophils Relative: 1 %
Eosinophils Absolute: 0.5 10*3/uL (ref 0.0–0.5)
Eosinophils Relative: 5 %
HCT: 39.1 % (ref 36.0–46.0)
Hemoglobin: 12.4 g/dL (ref 12.0–15.0)
Immature Granulocytes: 1 %
Lymphocytes Relative: 15 %
Lymphs Abs: 1.6 10*3/uL (ref 0.7–4.0)
MCH: 27.9 pg (ref 26.0–34.0)
MCHC: 31.7 g/dL (ref 30.0–36.0)
MCV: 87.9 fL (ref 80.0–100.0)
Monocytes Absolute: 0.9 10*3/uL (ref 0.1–1.0)
Monocytes Relative: 8 %
Neutro Abs: 7.5 10*3/uL (ref 1.7–7.7)
Neutrophils Relative %: 70 %
Platelets: 304 10*3/uL (ref 150–400)
RBC: 4.45 MIL/uL (ref 3.87–5.11)
RDW: 12.6 % (ref 11.5–15.5)
WBC: 10.5 10*3/uL (ref 4.0–10.5)
nRBC: 0 % (ref 0.0–0.2)

## 2023-11-01 LAB — GLUCOSE, CAPILLARY
Glucose-Capillary: 117 mg/dL — ABNORMAL HIGH (ref 70–99)
Glucose-Capillary: 118 mg/dL — ABNORMAL HIGH (ref 70–99)
Glucose-Capillary: 131 mg/dL — ABNORMAL HIGH (ref 70–99)
Glucose-Capillary: 136 mg/dL — ABNORMAL HIGH (ref 70–99)
Glucose-Capillary: 187 mg/dL — ABNORMAL HIGH (ref 70–99)
Glucose-Capillary: 301 mg/dL — ABNORMAL HIGH (ref 70–99)

## 2023-11-01 MED ORDER — IBUPROFEN 400 MG PO TABS: 400.0000 mg | ORAL_TABLET | Freq: Once | ORAL | Status: DC

## 2023-11-01 MED ORDER — INSULIN REGULAR HUMAN (CONC) 500 UNIT/ML ~~LOC~~ SOPN
60.0000 [IU] | PEN_INJECTOR | Freq: Two times a day (BID) | SUBCUTANEOUS | Status: DC
  Administered 2023-11-01: 100 [IU] via SUBCUTANEOUS
  Administered 2023-11-02: 60 [IU] via SUBCUTANEOUS

## 2023-11-01 MED ORDER — CIPROFLOXACIN IN D5W 400 MG/200ML IV SOLN
400.0000 mg | Freq: Two times a day (BID) | INTRAVENOUS | Status: DC
  Administered 2023-11-01 – 2023-11-02 (×3): 400 mg via INTRAVENOUS
  Filled 2023-11-01 (×2): qty 200

## 2023-11-01 NOTE — Progress Notes (Signed)
 [facility] Progress Note     Subjective: Patient seen and examined.  She is resting comfortably in chair at the side of the bed.  She states that her pain is mostly resolved.  She tolerated the dressing change earlier by nursing staff without issue.  She has no other complaints at this time.  Objective: Vital signs in last 24 hours: Temp:  [97.9 F (36.6 C)-98.2 F (36.8 C)] 97.9 F (36.6 C) (03/15 0353) Pulse Rate:  [80-90] 82 (03/15 0353) Resp:  [17-20] 20 (03/15 0353) BP: (92-111)/(60-76) 105/63 (03/15 0353) SpO2:  [95 %-100 %] 99 % (03/15 0353)    Intake/Output from previous day: 03/14 0701 - 03/15 0700 In: 302.3 [IV Piggyback:302.3] Out: -  Intake/Output this shift: Total I/O In: 120 [P.O.:120] Out: -   General appearance: alert, cooperative, and no distress GI: Patient's previous incision and drainage site with packing in place, minimal bloody drainage noted, improving surrounding induration and erythema, tender to palpation  Lab Results:  Recent Labs    10/31/23 0439 11/01/23 0800  WBC 15.2* 10.5  HGB 12.3 12.4  HCT 38.7 39.1  PLT 271 304   BMET Recent Labs    10/30/23 1827 10/31/23 0439  NA 134* 134*  K 3.6 3.6  CL 97* 100  CO2 25 25  GLUCOSE 308* 356*  BUN 13 13  CREATININE 0.85 0.75  CALCIUM 9.1 8.6*   PT/INR Recent Labs    10/31/23 0439  LABPROT 14.7  INR 1.1    Studies/Results: US PELVIC COMPLETE WITH TRANSVAGINAL Result Date: 10/31/2023 CLINICAL DATA:  Pelvic mass on prior CT EXAM: TRANSABDOMINAL AND TRANSVAGINAL ULTRASOUND OF PELVIS TECHNIQUE: Both transabdominal and transvaginal ultrasound examinations of the pelvis were performed. Transabdominal technique was performed for global imaging of the pelvis including uterus, ovaries, adnexal regions, and pelvic cul-de-sac. It was necessary to proceed with endovaginal exam following the transabdominal exam to visualize the uterus, endometrium, ovaries and adnexa. COMPARISON:   CT 10/30/2023 FINDINGS: Uterus Measurements: 7.2 x 4.7 x 5.8 cm = volume: 102 mL. 2.1 cm with right intramural fibroid. Uterus is retroverted. Endometrium Thickness: Normal thickness, 3 mm.  No focal abnormality visualized. Right ovary Measurements: 2.6 x 1.4 x 2.5 cm = volume: 5 mL. Normal appearance/no adnexal mass. Left ovary Measurements: 3.4 x 1.6 x 2.0 cm = volume: 6 mL. Normal appearance/no adnexal mass. Other findings No free fluid. There is a large right adnexal solid mass measuring 6.1 x 4.9 x 4.0 cm. This corresponds to the pelvic mass seen on CT. This has echotexture and appearance similar to the uterus and may reflect a large pedunculated fibroid, but exact connection to the uterus cannot be visualized. IMPRESSION: Fibroid uterus. 6.1 cm right adnexal solid mass with echotexture similar to the uterus. This may reflect a pedunculated fibroid, but this cannot be confirmed by ultrasound. When the patient is clinically stable and able to follow directions and hold their breath (preferably as an outpatient) further evaluation with dedicated pelvic MRI should be considered. Electronically Signed   By: Charlett Nose M.D.   On: 10/31/2023 18:19   CT PELVIS WO CONTRAST Result Date: 10/30/2023 CLINICAL DATA:  Abscess to the left buttocks x1 week. EXAM: CT PELVIS WITHOUT CONTRAST TECHNIQUE: Multidetector CT imaging of the pelvis was performed following the standard protocol without intravenous contrast. RADIATION DOSE REDUCTION: This exam was performed according to the departmental dose-optimization program which includes automated exposure control, adjustment of the mA and/or kV according to patient size and/or use  of iterative reconstruction technique. COMPARISON:  None Available. FINDINGS: Urinary Tract:  No abnormality visualized. Bowel:  Unremarkable visualized pelvic bowel loops. Vascular/Lymphatic: No pathologically enlarged lymph nodes. No significant vascular abnormality seen. Reproductive: A 6.3 cm x  4.7 cm x 6.2 cm well-defined area of soft tissue attenuation (approximately 53.33 Hounsfield units) is seen within the right adnexa. This represents a new finding when compared to the prior study. Other:  None. Musculoskeletal: Moderate severity left-sided perirectal inflammatory fat stranding is seen. This extends inferiorly along the left gluteal fold. A 6.5 mm x 12.6 mm x 11.9 mm ill-defined area of fluid attenuation (approximately 23.07 Hounsfield units) is seen within the perirectal region on the left (axial CT image 121, CT series 5/coronal reformatted image 110, CT series 8). No acute osseous abnormalities are identified. IMPRESSION: 1. Moderate severity left-sided perirectal inflammatory fat stranding with a small left perirectal abscess. 2. 6.3 cm x 4.7 cm x 6.2 cm well-defined area of soft tissue attenuation within the right adnexa. While this may represent a hemorrhagic cyst or exophytic uterine fibroid, further evaluation with pelvic ultrasound is recommended. Electronically Signed   By: Aram Candela M.D.   On: 10/30/2023 21:49    Anti-infectives: Anti-infectives (From admission, onward)    Start     Dose/Rate Route Frequency Ordered Stop   11/01/23 1400  ciprofloxacin (CIPRO) IVPB 400 mg        400 mg 200 mL/hr over 60 Minutes Intravenous Every 12 hours 11/01/23 1227     10/31/23 1800  vancomycin (VANCOREADY) IVPB 1250 mg/250 mL        1,250 mg 166.7 mL/hr over 90 Minutes Intravenous Every 12 hours 10/31/23 0908     10/31/23 1600  vancomycin (VANCOCIN) IVPB 1000 mg/200 mL premix  Status:  Discontinued        1,000 mg 200 mL/hr over 60 Minutes Intravenous Every 12 hours 10/31/23 0435 10/31/23 0908   10/31/23 1200  ciprofloxacin (CIPRO) IVPB 400 mg  Status:  Discontinued        400 mg 200 mL/hr over 60 Minutes Intravenous Every 12 hours 10/31/23 0431 11/01/23 1227   10/31/23 1200  metroNIDAZOLE (FLAGYL) IVPB 500 mg        500 mg 100 mL/hr over 60 Minutes Intravenous Every 12  hours 10/31/23 0431     10/31/23 0530  vancomycin (VANCOCIN) IVPB 1000 mg/200 mL premix  Status:  Discontinued        1,000 mg 200 mL/hr over 60 Minutes Intravenous  Once 10/31/23 0431 10/31/23 0433   10/31/23 0530  vancomycin (VANCOREADY) IVPB 2000 mg/400 mL        2,000 mg 200 mL/hr over 120 Minutes Intravenous  Once 10/31/23 0433 10/31/23 0756   10/30/23 2230  ciprofloxacin (CIPRO) IVPB 400 mg        400 mg 200 mL/hr over 60 Minutes Intravenous  Once 10/30/23 2227 10/31/23 0120   10/30/23 2230  metroNIDAZOLE (FLAGYL) IVPB 500 mg        500 mg 100 mL/hr over 60 Minutes Intravenous  Once 10/30/23 2227 10/31/23 0120       Assessment/Plan:  Patient is a 43 year old female who was admitted with a left buttock/perirectal abscess status post bedside incision and drainage in the ED on 3/14.   -Patient's leukocytosis has resolved today, 10.5 from 15.2 -Gram stain with gram-positive cocci in pairs, await final cultures -Continue broad-spectrum IV antibiotics.  She will need a total of 10 days of antibiotic treatment -Continue with nursing daily  dressing changes, orders in place -Patient states that she has no one able to help her with dressing changes at home.  We discussed that home health care usually will only come 2-3 times per week for dressing changes.  She may benefit from not packing the wound at home and just performing daily sitz bath's and after bowel movements to keep the area clean -I will plan to evaluate the wound tomorrow -Blood sugars have been better controlled -Patient will likely be stable for discharge home in 24 to 48 hours -Appreciate hospitalist recommendations   LOS: 2 days    Julie Berry 11/01/2023  Note: Portions of this report may have been transcribed using voice recognition software. Every effort has been made to ensure accuracy; however, inadvertent computerized transcription errors may still be present.

## 2023-11-01 NOTE — Progress Notes (Signed)
 PROGRESS NOTE   Julie Berry  GMW:[id number] DOB: 1981-05-13 DOA: 10/30/2023 PCP: Elfredia Nevins, MD   Chief Complaint  Patient presents with   Abscess   Level of care: Med-Surg  Brief Admission History:  43 y.o.female with medical history significant for type diabetes mellitus, hypertension, dyslipidemia, OSA, who presented to emergency room with acute onset of left gluteal pain with swelling, warmth.  It has become throbbing before she came to the ER.  She denied any fever or chills.  No nausea or vomiting or abdominal pain.  No blood glucose levels have been elevated.  No chest pain or palpitations.  No dysuria, oliguria or hematuria or flank pain.   ED Course: When she came to the ER, vital signs were within normal.  Labs revealed Hyponatremia 134 with hypochloremia of 97 and blood glucose of 308 with otherwise unremarkable CMP.  CBC showed leukocytosis of 16 with neutrophilia.  Urine pregnancy test was negative. Imaging: Pelvic CT without contrast revealed the following:1. Moderate severity left-sided perirectal inflammatory fat stranding with a small left perirectal abscess. 2. 6.3 cm x 4.7 cm x 6.2 cm well-defined area of soft tissue attenuation within the right adnexa. While this may represent a hemorrhagic cyst or exophytic uterine fibroid, further evaluation with pelvic ultrasound is recommended.   The patient was given IV vanc, Cipro and Flagyl, a gram of p.o. Tylenol 1 p.o. Percocet, and lidocaine with epi and had a I&D by the ER provider.  Consultation with Dr. Robyne Peers who recommended admission for further IV antibiotic therapy and surgical consultation.  She will be admitted to a medical bed for further evaluation and management.   Assessment and Plan:  Perirectal Left Gluteal abscess - The patient presented with sepsis.   - She underwent incision and drainage in ED on 3/13. - appreciate general surgery consult, will monitor over next couple days to see if it continues  to drain  - Continue antibiotic therapy with IV vancomycin, Cipro and Flagyl.  GERD without esophagitis - We will continue PPI therapy.  Uncontrolled type 2 diabetes mellitus with hyperglycemia, with long-term current use of insulin  Severe Insulin Resistance  - Continue U500 insulin with breakfast and supper, per pharm D, takes 60-100 units via sliding scale.  Pharm D was able to enter her home sliding scale for use in hospital.  Follow CBG at least 5x per day; Carb modified diet; Titrate doses as needed when more glucose data available   DVT prophylaxis: enoxaparin Code Status: Full  Family Communication:  Disposition: continue inpatient care    Consultants:  Surgeon  Procedures:  I&D on 10/30/23   Antimicrobials:  Vanc 3/13>> Cipro 3/13>> Metronidazole 3/13>>   Subjective: Less pain and discomfort today; blood sugars are better controlled   Objective: Vitals:   10/31/23 2017 11/01/23 0019 11/01/23 0353 11/01/23 1355  BP: 109/60 92/60 105/63 116/64  Pulse: 88 90 82 80  Resp:  20 20   Temp: 98.2 F (36.8 C) 98.2 F (36.8 C) 97.9 F (36.6 C) 98.3 F (36.8 C)  TempSrc: Oral Oral Oral Oral  SpO2: 99% 95% 99% 99%  Weight:      Height:        Intake/Output Summary (Last 24 hours) at 11/01/2023 1520 Last data filed at 11/01/2023 1300 Gross per 24 hour  Intake 662.29 ml  Output --  Net 662.29 ml   Filed Weights   10/30/23 1452 10/31/23 0222  Weight: 132.9 kg 131.6 kg   Examination:  General exam:  Appears calm and moderately uncomfortable, m.o. Respiratory system: Clear to auscultation. Respiratory effort normal. Cardiovascular system: normal S1 & S2 heard. No JVD, murmurs, rubs, gallops or clicks. No pedal edema. Gastrointestinal system: Abdomen is nondistended, soft and nontender. No organomegaly or masses felt. Normal bowel sounds heard. Central nervous system: Alert and oriented. No focal neurological deficits. Extremities: Symmetric 5 x 5 power. Skin:  abscess continues to drain Psychiatry: Judgement and insight appear normal. Mood & affect appropriate.   Data Reviewed: I have personally reviewed following labs and imaging studies  CBC: Recent Labs  Lab 10/30/23 1827 10/31/23 0439 11/01/23 0800  WBC 16.0* 15.2* 10.5  NEUTROABS 12.0*  --  7.5  HGB 13.9 12.3 12.4  HCT 43.0 38.7 39.1  MCV 85.7 86.4 87.9  PLT 307 271 304    Basic Metabolic Panel: Recent Labs  Lab 10/30/23 1827 10/31/23 0439  NA 134* 134*  K 3.6 3.6  CL 97* 100  CO2 25 25  GLUCOSE 308* 356*  BUN 13 13  CREATININE 0.85 0.75  CALCIUM 9.1 8.6*    CBG: Recent Labs  Lab 10/31/23 2016 11/01/23 0016 11/01/23 0352 11/01/23 0722 11/01/23 1104  GLUCAP 114* 187* 117* 131* 136*    Recent Results (from the past 240 hours)  Aerobic/Anaerobic Culture w Gram Stain (surgical/deep wound)     Status: None (Preliminary result)   Collection Time: 10/31/23 10:40 AM   Specimen: Abscess  Result Value Ref Range Status   Specimen Description   Final    ABSCESS Performed at [facility], [address], [place], [place] [address]    Special Requests   Final    NONE Performed at [facility], [address], [place], [place] [address]    Gram Stain   Final    ABUNDANT WBC PRESENT, PREDOMINANTLY PMN RARE GRAM POSITIVE COCCI IN PAIRS    Culture   Final    MODERATE GROUP B STREP(S.AGALACTIAE)ISOLATED TESTING AGAINST S. AGALACTIAE NOT ROUTINELY PERFORMED DUE TO PREDICTABILITY OF AMP/PEN/VAN SUSCEPTIBILITY. Performed at [facility], [address] [place], [place] [address]    Report Status PENDING  Incomplete     Radiology Studies: US PELVIC COMPLETE WITH TRANSVAGINAL Result Date: 10/31/2023 CLINICAL DATA:  Pelvic mass on prior CT EXAM: TRANSABDOMINAL AND TRANSVAGINAL ULTRASOUND OF PELVIS TECHNIQUE: Both transabdominal and transvaginal ultrasound examinations of the pelvis were performed. Transabdominal technique was performed for global imaging of the  pelvis including uterus, ovaries, adnexal regions, and pelvic cul-de-sac. It was necessary to proceed with endovaginal exam following the transabdominal exam to visualize the uterus, endometrium, ovaries and adnexa. COMPARISON:  CT 10/30/2023 FINDINGS: Uterus Measurements: 7.2 x 4.7 x 5.8 cm = volume: 102 mL. 2.1 cm with right intramural fibroid. Uterus is retroverted. Endometrium Thickness: Normal thickness, 3 mm.  No focal abnormality visualized. Right ovary Measurements: 2.6 x 1.4 x 2.5 cm = volume: 5 mL. Normal appearance/no adnexal mass. Left ovary Measurements: 3.4 x 1.6 x 2.0 cm = volume: 6 mL. Normal appearance/no adnexal mass. Other findings No free fluid. There is a large right adnexal solid mass measuring 6.1 x 4.9 x 4.0 cm. This corresponds to the pelvic mass seen on CT. This has echotexture and appearance similar to the uterus and may reflect a large pedunculated fibroid, but exact connection to the uterus cannot be visualized. IMPRESSION: Fibroid uterus. 6.1 cm right adnexal solid mass with echotexture similar to the uterus. This may reflect a pedunculated fibroid, but this cannot be confirmed by ultrasound. When  the patient is clinically stable and able to follow directions and hold their breath (preferably as an outpatient) further evaluation with dedicated pelvic MRI should be considered. Electronically Signed   By: Charlett Nose M.D.   On: 10/31/2023 18:19   CT PELVIS WO CONTRAST Result Date: 10/30/2023 CLINICAL DATA:  Abscess to the left buttocks x1 week. EXAM: CT PELVIS WITHOUT CONTRAST TECHNIQUE: Multidetector CT imaging of the pelvis was performed following the standard protocol without intravenous contrast. RADIATION DOSE REDUCTION: This exam was performed according to the departmental dose-optimization program which includes automated exposure control, adjustment of the mA and/or kV according to patient size and/or use of iterative reconstruction technique. COMPARISON:  None Available.  FINDINGS: Urinary Tract:  No abnormality visualized. Bowel:  Unremarkable visualized pelvic bowel loops. Vascular/Lymphatic: No pathologically enlarged lymph nodes. No significant vascular abnormality seen. Reproductive: A 6.3 cm x 4.7 cm x 6.2 cm well-defined area of soft tissue attenuation (approximately 53.33 Hounsfield units) is seen within the right adnexa. This represents a new finding when compared to the prior study. Other:  None. Musculoskeletal: Moderate severity left-sided perirectal inflammatory fat stranding is seen. This extends inferiorly along the left gluteal fold. A 6.5 mm x 12.6 mm x 11.9 mm ill-defined area of fluid attenuation (approximately 23.07 Hounsfield units) is seen within the perirectal region on the left (axial CT image 121, CT series 5/coronal reformatted image 110, CT series 8). No acute osseous abnormalities are identified. IMPRESSION: 1. Moderate severity left-sided perirectal inflammatory fat stranding with a small left perirectal abscess. 2. 6.3 cm x 4.7 cm x 6.2 cm well-defined area of soft tissue attenuation within the right adnexa. While this may represent a hemorrhagic cyst or exophytic uterine fibroid, further evaluation with pelvic ultrasound is recommended. Electronically Signed   By: Aram Candela M.D.   On: 10/30/2023 21:49    Scheduled Meds:  enoxaparin (LOVENOX) injection  65 mg Subcutaneous Q24H   ibuprofen  400 mg Oral Once   insulin aspart  0-20 Units Subcutaneous TID WC   insulin regular human CONCENTRATED  60-100 Units Subcutaneous BID WC   pantoprazole  40 mg Oral Daily   Continuous Infusions:  ciprofloxacin 400 mg (11/01/23 1429)   metronidazole 500 mg (11/01/23 1216)   vancomycin 1,250 mg (11/01/23 0653)    LOS: 2 days   Time spent: 55 mins  Aalayah Riles Laural Benes, MD How to contact the [facility] Attending or Consulting provider 7A - 7P or covering provider during after hours 7P -7A, for this patient?  Check the care team in [facility] and look for a)  attending/consulting [facility] provider listed and b) the [facility] team listed Log into [email or web address] to find provider on call.  Locate the [facility] provider you are looking for under [facility] and page to a number that you can be directly reached. If you still have difficulty reaching the provider, please page the [facility] (Director on Call) for the Hospitalists listed on amion for assistance.  11/01/2023, 3:20 PM

## 2023-11-02 DIAGNOSIS — E1165 Type 2 diabetes mellitus with hyperglycemia: Secondary | ICD-10-CM | POA: Diagnosis not present

## 2023-11-02 DIAGNOSIS — K219 Gastro-esophageal reflux disease without esophagitis: Secondary | ICD-10-CM | POA: Diagnosis not present

## 2023-11-02 DIAGNOSIS — E8881 Metabolic syndrome: Secondary | ICD-10-CM | POA: Diagnosis not present

## 2023-11-02 DIAGNOSIS — K611 Rectal abscess: Secondary | ICD-10-CM | POA: Diagnosis not present

## 2023-11-02 LAB — GLUCOSE, CAPILLARY
Glucose-Capillary: 111 mg/dL — ABNORMAL HIGH (ref 70–99)
Glucose-Capillary: 158 mg/dL — ABNORMAL HIGH (ref 70–99)
Glucose-Capillary: 230 mg/dL — ABNORMAL HIGH (ref 70–99)

## 2023-11-02 MED ORDER — SACCHAROMYCES BOULARDII 250 MG PO CAPS
250.0000 mg | ORAL_CAPSULE | Freq: Two times a day (BID) | ORAL | 0 refills | Status: AC
Start: 2023-11-02 — End: 2023-11-16

## 2023-11-02 MED ORDER — PEN NEEDLES 33G X 4 MM MISC: 1.0000 | 3 refills | Status: AC

## 2023-11-02 MED ORDER — ENOXAPARIN SODIUM 60 MG/0.6ML IJ SOSY: 60.0000 mg | PREFILLED_SYRINGE | INTRAMUSCULAR | Status: DC

## 2023-11-02 MED ORDER — CLINDAMYCIN HCL 300 MG PO CAPS: 300.0000 mg | ORAL_CAPSULE | Freq: Three times a day (TID) | ORAL | 0 refills | Status: AC

## 2023-11-02 MED ORDER — HUMULIN R U-500 KWIKPEN 500 UNIT/ML ~~LOC~~ SOPN: 90.0000 [IU] | PEN_INJECTOR | Freq: Two times a day (BID) | SUBCUTANEOUS | Status: AC

## 2023-11-02 NOTE — Discharge Instructions (Addendum)
-  Perform sitz baths 2-3 times per day and after bowel movements. -Keep your incision and drainage site clean and dry -Do not need to cover -Recommend wearing pad and underwear, as area may continue to have drainage    IMPORTANT INFORMATION: PAY CLOSE ATTENTION   PHYSICIAN DISCHARGE INSTRUCTIONS  Follow with Primary care provider  Elfredia Nevins, MD  and other consultants as instructed by your Hospitalist Physician  SEEK MEDICAL CARE OR RETURN TO EMERGENCY ROOM IF SYMPTOMS COME BACK, WORSEN OR NEW PROBLEM DEVELOPS   Please note: You were cared for by a hospitalist during your hospital stay. Every effort will be made to forward records to your primary care provider.  You can request that your primary care provider send for your hospital records if they have not received them.  Once you are discharged, your primary care physician will handle any further medical issues. Please note that NO REFILLS for any discharge medications will be authorized once you are discharged, as it is imperative that you return to your primary care physician (or establish a relationship with a primary care physician if you do not have one) for your post hospital discharge needs so that they can reassess your need for medications and monitor your lab values.  Please get a complete blood count and chemistry panel checked by your Primary MD at your next visit, and again as instructed by your Primary MD.  Get Medicines reviewed and adjusted: Please take all your medications with you for your next visit with your Primary MD  Laboratory/radiological data: Please request your Primary MD to go over all hospital tests and procedure/radiological results at the follow up, please ask your primary care provider to get all Hospital records sent to his/her office.  In some cases, they will be blood work, cultures and biopsy results pending at the time of your discharge. Please request that your primary care provider follow up on  these results.  If you are diabetic, please bring your blood sugar readings with you to your follow up appointment with primary care.    Please call and make your follow up appointments as soon as possible.    Also Note the following: If you experience worsening of your admission symptoms, develop shortness of breath, life threatening emergency, suicidal or homicidal thoughts you must seek medical attention immediately by calling 911 or calling your MD immediately  if symptoms less severe.  You must read complete instructions/literature along with all the possible adverse reactions/side effects for all the Medicines you take and that have been prescribed to you. Take any new Medicines after you have completely understood and accpet all the possible adverse reactions/side effects.   Do not drive when taking Pain medications or sleeping medications (Benzodiazepines)  Do not take more than prescribed Pain, Sleep and Anxiety Medications. It is not advisable to combine anxiety,sleep and pain medications without talking with your primary care practitioner  Special Instructions: If you have smoked or chewed Tobacco  in the last 2 yrs please stop smoking, stop any regular Alcohol  and or any Recreational drug use.  Wear Seat belts while driving.  Do not drive if taking any narcotic, mind altering or controlled substances or recreational drugs or alcohol.

## 2023-11-02 NOTE — Progress Notes (Signed)
 [facility] Progress Note     Subjective: Patient seen and examined.  She is resting comfortably in the chair.  She underwent a dressing change overnight.  Her pain is well-controlled and significantly improved from when she presented to the hospital.  She is tolerating a diet without nausea and vomiting.  Objective: Vital signs in last 24 hours: Temp:  [97.6 F (36.4 C)-98.5 F (36.9 C)] 97.6 F (36.4 C) (03/16 0344) Pulse Rate:  [71-80] 71 (03/16 0344) Resp:  [19-20] 19 (03/16 0344) BP: (109-121)/(64-66) 109/65 (03/16 0344) SpO2:  [99 %-100 %] 100 % (03/16 0344) Last BM Date : 10/30/23  Intake/Output from previous day: 03/15 0701 - 03/16 0700 In: 2088.7 [P.O.:600; IV Piggyback:1488.7] Out: -  Intake/Output this shift: No intake/output data recorded.  General appearance: alert, cooperative, and no distress GI: Perirectal incision and drainage site with packing in place, minimal serosanguineous drainage noted, improving induration, erythema, and tenderness  Lab Results:  Recent Labs    10/31/23 0439 11/01/23 0800  WBC 15.2* 10.5  HGB 12.3 12.4  HCT 38.7 39.1  PLT 271 304   BMET Recent Labs    10/30/23 1827 10/31/23 0439  NA 134* 134*  K 3.6 3.6  CL 97* 100  CO2 25 25  GLUCOSE 308* 356*  BUN 13 13  CREATININE 0.85 0.75  CALCIUM 9.1 8.6*   PT/INR Recent Labs    10/31/23 0439  LABPROT 14.7  INR 1.1    Studies/Results: US PELVIC COMPLETE WITH TRANSVAGINAL Result Date: 10/31/2023 CLINICAL DATA:  Pelvic mass on prior CT EXAM: TRANSABDOMINAL AND TRANSVAGINAL ULTRASOUND OF PELVIS TECHNIQUE: Both transabdominal and transvaginal ultrasound examinations of the pelvis were performed. Transabdominal technique was performed for global imaging of the pelvis including uterus, ovaries, adnexal regions, and pelvic cul-de-sac. It was necessary to proceed with endovaginal exam following the transabdominal exam to visualize the uterus, endometrium, ovaries  and adnexa. COMPARISON:  CT 10/30/2023 FINDINGS: Uterus Measurements: 7.2 x 4.7 x 5.8 cm = volume: 102 mL. 2.1 cm with right intramural fibroid. Uterus is retroverted. Endometrium Thickness: Normal thickness, 3 mm.  No focal abnormality visualized. Right ovary Measurements: 2.6 x 1.4 x 2.5 cm = volume: 5 mL. Normal appearance/no adnexal mass. Left ovary Measurements: 3.4 x 1.6 x 2.0 cm = volume: 6 mL. Normal appearance/no adnexal mass. Other findings No free fluid. There is a large right adnexal solid mass measuring 6.1 x 4.9 x 4.0 cm. This corresponds to the pelvic mass seen on CT. This has echotexture and appearance similar to the uterus and may reflect a large pedunculated fibroid, but exact connection to the uterus cannot be visualized. IMPRESSION: Fibroid uterus. 6.1 cm right adnexal solid mass with echotexture similar to the uterus. This may reflect a pedunculated fibroid, but this cannot be confirmed by ultrasound. When the patient is clinically stable and able to follow directions and hold their breath (preferably as an outpatient) further evaluation with dedicated pelvic MRI should be considered. Electronically Signed   By: Charlett Nose M.D.   On: 10/31/2023 18:19    Anti-infectives: Anti-infectives (From admission, onward)    Start     Dose/Rate Route Frequency Ordered Stop   11/01/23 1400  ciprofloxacin (CIPRO) IVPB 400 mg        400 mg 200 mL/hr over 60 Minutes Intravenous Every 12 hours 11/01/23 1227     10/31/23 1800  vancomycin (VANCOREADY) IVPB 1250 mg/250 mL        1,250 mg 166.7 mL/hr over 90  Minutes Intravenous Every 12 hours 10/31/23 0908     10/31/23 1600  vancomycin (VANCOCIN) IVPB 1000 mg/200 mL premix  Status:  Discontinued        1,000 mg 200 mL/hr over 60 Minutes Intravenous Every 12 hours 10/31/23 0435 10/31/23 0908   10/31/23 1200  ciprofloxacin (CIPRO) IVPB 400 mg  Status:  Discontinued        400 mg 200 mL/hr over 60 Minutes Intravenous Every 12 hours 10/31/23 0431  11/01/23 1227   10/31/23 1200  metroNIDAZOLE (FLAGYL) IVPB 500 mg        500 mg 100 mL/hr over 60 Minutes Intravenous Every 12 hours 10/31/23 0431     10/31/23 0530  vancomycin (VANCOCIN) IVPB 1000 mg/200 mL premix  Status:  Discontinued        1,000 mg 200 mL/hr over 60 Minutes Intravenous  Once 10/31/23 0431 10/31/23 0433   10/31/23 0530  vancomycin (VANCOREADY) IVPB 2000 mg/400 mL        2,000 mg 200 mL/hr over 120 Minutes Intravenous  Once 10/31/23 0433 10/31/23 0756   10/30/23 2230  ciprofloxacin (CIPRO) IVPB 400 mg        400 mg 200 mL/hr over 60 Minutes Intravenous  Once 10/30/23 2227 10/31/23 0120   10/30/23 2230  metroNIDAZOLE (FLAGYL) IVPB 500 mg        500 mg 100 mL/hr over 60 Minutes Intravenous  Once 10/30/23 2227 10/31/23 0120       Assessment/Plan:  Patient is a 43 year old female who was admitted with left buttock/perirectal abscess status post bedside incision and drainage in the ED on 3/14.  -Patient continues to improve with improved glycemic control -Culture demonstrating group B strep (strep agalactiae) -Continue IV antibiotics while inpatient.  Recommend a total of 10 days of antibiotic treatment -Packing changed by myself at bedside, area irrigated and packing replaced. -Upon discharge, I recommend that the patient not pack the wound and just perform twice daily/3 times daily sitz bath's and sitz bath's following bowel movements to keep the area clean.  She otherwise does not need to keep the area covered or packed.  Will likely need to wear a pad in her underwear, as the wound may continue to have some drainage -Patient stable for discharge from a general surgery standpoint -Will follow-up with me in 2 weeks for wound recheck -Appreciate hospitalist recommendations   LOS: 3 days    Idrissa Beville A Haiden Clucas 11/02/2023  Note: Portions of this report may have been transcribed using voice recognition software. Every effort has been made to ensure accuracy;  however, inadvertent computerized transcription errors may still be present.

## 2023-11-02 NOTE — Plan of Care (Signed)
   Problem: Activity: Goal: Risk for activity intolerance will decrease Outcome: Progressing   Problem: Coping: Goal: Level of anxiety will decrease Outcome: Progressing

## 2023-11-02 NOTE — Discharge Summary (Signed)
 Physician Discharge Summary  Julie Berry WGN:[id number] DOB: [birth year] DOA: 10/30/2023  PCP: Elfredia Nevins, MD Surgery: Pappayliou  Admit date: 10/30/2023 Discharge date: 11/02/2023  Admitted From:  Home  Disposition: Home   Recommendations for Outpatient Follow-up:  Follow up with PCP in 1 weeks Follow up with surgeon in 2 weeks   Discharge Condition: STABLE   CODE STATUS: FULL DIET: carb modified, avoid concentrated sweets and fruit juices except to treat a low blood suger   Brief Hospitalization Summary: Please see all hospital notes, images, labs for full details of the hospitalization. Admission provider HPI:  43 y.o.female with medical history significant for type diabetes mellitus, hypertension, dyslipidemia, OSA, who presented to emergency room with acute onset of left gluteal pain with swelling, warmth.  It has become throbbing before she came to the ER.  She denied any fever or chills.  No nausea or vomiting or abdominal pain.  No blood glucose levels have been elevated.  No chest pain or palpitations.  No dysuria, oliguria or hematuria or flank pain.   ED Course: When she came to the ER, vital signs were within normal.  Labs revealed Hyponatremia 134 with hypochloremia of 97 and blood glucose of 308 with otherwise unremarkable CMP.  CBC showed leukocytosis of 16 with neutrophilia.  Urine pregnancy test was negative. Imaging: Pelvic CT without contrast revealed the following:1. Moderate severity left-sided perirectal inflammatory fat stranding with a small left perirectal abscess. 2. 6.3 cm x 4.7 cm x 6.2 cm well-defined area of soft tissue attenuation within the right adnexa. While this may represent a hemorrhagic cyst or exophytic uterine fibroid, further evaluation with pelvic ultrasound is recommended.   The patient was given IV vanc, Cipro and Flagyl, a gram of p.o. Tylenol 1 p.o. Percocet, and lidocaine with epi and had a I&D by the ER provider.  Consultation with Dr.  Robyne Peers who recommended admission for further IV antibiotic therapy and surgical consultation.  She will be admitted to a medical bed for further evaluation and management.  Hospital Course by problem list   Perirectal Left Gluteal abscess - The patient presented with sepsis.   - She underwent incision and drainage in ED on 3/13. - appreciate general surgery consult, will monitor over next couple days to see if it continues to drain  - Pt was started on antibiotic therapy with IV vancomycin, Cipro and Flagyl.  Discussed with surgeon, recommends full 10 day course of antibiotics.  Pt can discharge today.  Recommendation is for patient to:   -Perform sitz baths 2-3 times per day and after bowel movements. -Keep your incision and drainage site clean and dry -Do not need to cover -Recommend wearing pad and underwear, as area may continue to have drainage   GERD without esophagitis - Given PPI therapy in hospital    Uncontrolled type 2 diabetes mellitus with hyperglycemia, with long-term current use of insulin  Severe Insulin Resistance  - Continue U500 insulin with breakfast and supper, per pharm D, takes 60-100 units via sliding scale.  Pharm D was able to enter her home sliding scale for use in hospital.  Follow CBG at least 5x per day; Carb modified diet; Titrate doses as needed when more glucose data available -- uncontrolled disease as evidenced by A1c of 14.8%  - she is not taking insulin regularly at home as evidenced by the fact that when we started her on her home insulin in hospital her blood sugars have been very well managed for the most  part.  She was counseled on the importance of taking her insulin and monitoring blood glucose closely.  She verbalized understanding.    CBG (last 3)  Recent Labs    11/02/23 0346 11/02/23 0721 11/02/23 1119  GLUCAP 111* 158* 230*   Discharge Diagnoses:  Principal Problem:   Perirectal abscess Active Problems:   Insulin resistance  syndrome   Uncontrolled type 2 diabetes mellitus with hyperglycemia, with long-term current use of insulin (HCC)   GERD without esophagitis  Discharge Instructions:  Allergies as of 11/02/2023       Reactions   Amoxicillin Anaphylaxis, Other (See Comments)   Has patient had a PCN reaction causing immediate rash, facial/tongue/throat swelling, SOB or lightheadedness with hypotension: Yes Has patient had a PCN reaction causing severe rash involving mucus membranes or skin necrosis: No Has patient had a PCN reaction that required hospitalization No Has patient had a PCN reaction occurring within the last 10 years: No If all of the above answers are "NO", then may proceed with Cephalosporin use.   Penicillins Anaphylaxis, Other (See Comments)   Has patient had a PCN reaction causing immediate rash, facial/tongue/throat swelling, SOB or lightheadedness with hypotension: Yes Has patient had a PCN reaction causing severe rash involving mucus membranes or skin necrosis: No Has patient had a PCN reaction that required hospitalization No Has patient had a PCN reaction occurring within the last 10 years: No If all of the above answers are "NO", then may proceed with Cephalosporin use.   Dilaudid  [hydromorphone Hcl]    Meloxicam Other (See Comments)   Caused migraine headache   Peanut (diagnostic)    Contrast Media [iodinated Contrast Media] Hives   CT Contrast, had hives for 3 days after cardiac CT   Latex Rash   Trulicity [dulaglutide] Nausea Only, Palpitations        Medication List     STOP taking these medications    naproxen 500 MG tablet Commonly known as: NAPROSYN       TAKE these medications    acetaminophen 500 MG tablet Commonly known as: TYLENOL Take 1 tablet (500 mg total) by mouth every 6 (six) hours as needed.   clindamycin 300 MG capsule Commonly known as: CLEOCIN Take 1 capsule (300 mg total) by mouth 3 (three) times daily for 7 days. Start taking on: November 03, 2023 What changed: See the new instructions.   fluconazole 100 MG tablet Commonly known as: DIFLUCAN Take 1 tablet by mouth daily.   FreeStyle Libre 14 Day Sensor Misc 1 Units by Does not apply route every 14 (fourteen) days.   HumuLIN R U-500 KwikPen 500 UNIT/ML KwikPen Generic drug: insulin regular human CONCENTRATED Inject 90-100 Units into the skin 2 (two) times daily with a meal. INJECT 100 UNITS AT BREAKFAST AND 90 UNITS AT  DINNER. What changed:  how much to take how to take this when to take this additional instructions   ondansetron 4 MG tablet Commonly known as: Zofran Take 1 tablet (4 mg total) by mouth every 8 (eight) hours as needed for nausea or vomiting.   pantoprazole 40 MG tablet Commonly known as: PROTONIX Take 1 tablet every day by oral route.   Pen Needles 33G X 4 MM Misc 1 each by Other route See admin instructions. Use 1 pen needle to inject insulin 4 times daily.   saccharomyces boulardii 250 MG capsule Commonly known as: Florastor Take 1 capsule (250 mg total) by mouth 2 (two) times daily for 14  days.        Follow-up Information     Pappayliou, Gustavus Messing, DO. Call.   Specialty: General Surgery Why: Call to schedule follow up appointment in 2 weeks Contact information: [address] [facility] [address] [phone]         Elfredia Nevins, MD. Schedule an appointment as soon as possible for a visit in 1 week(s).   Specialty: Internal Medicine Why: Hospital Follow Up Contact information: [address] [place] [address] [phone]                Allergies  Allergen Reactions   Amoxicillin Anaphylaxis and Other (See Comments)    Has patient had a PCN reaction causing immediate rash, facial/tongue/throat swelling, SOB or lightheadedness with hypotension: Yes Has patient had a PCN reaction causing severe rash involving mucus membranes or skin necrosis: No Has patient had a PCN reaction that required  hospitalization No Has patient had a PCN reaction occurring within the last 10 years: No If all of the above answers are "NO", then may proceed with Cephalosporin use.   Penicillins Anaphylaxis and Other (See Comments)    Has patient had a PCN reaction causing immediate rash, facial/tongue/throat swelling, SOB or lightheadedness with hypotension: Yes Has patient had a PCN reaction causing severe rash involving mucus membranes or skin necrosis: No Has patient had a PCN reaction that required hospitalization No Has patient had a PCN reaction occurring within the last 10 years: No If all of the above answers are "NO", then may proceed with Cephalosporin use.   Dilaudid  [Hydromorphone Hcl]    Meloxicam Other (See Comments)    Caused migraine headache   Peanut (Diagnostic)    Contrast Media [Iodinated Contrast Media] Hives    CT Contrast, had hives for 3 days after cardiac CT   Latex Rash   Trulicity [Dulaglutide] Nausea Only and Palpitations   Allergies as of 11/02/2023       Reactions   Amoxicillin Anaphylaxis, Other (See Comments)   Has patient had a PCN reaction causing immediate rash, facial/tongue/throat swelling, SOB or lightheadedness with hypotension: Yes Has patient had a PCN reaction causing severe rash involving mucus membranes or skin necrosis: No Has patient had a PCN reaction that required hospitalization No Has patient had a PCN reaction occurring within the last 10 years: No If all of the above answers are "NO", then may proceed with Cephalosporin use.   Penicillins Anaphylaxis, Other (See Comments)   Has patient had a PCN reaction causing immediate rash, facial/tongue/throat swelling, SOB or lightheadedness with hypotension: Yes Has patient had a PCN reaction causing severe rash involving mucus membranes or skin necrosis: No Has patient had a PCN reaction that required hospitalization No Has patient had a PCN reaction occurring within the last 10 years: No If all of the  above answers are "NO", then may proceed with Cephalosporin use.   Dilaudid  [hydromorphone Hcl]    Meloxicam Other (See Comments)   Caused migraine headache   Peanut (diagnostic)    Contrast Media [iodinated Contrast Media] Hives   CT Contrast, had hives for 3 days after cardiac CT   Latex Rash   Trulicity [dulaglutide] Nausea Only, Palpitations        Medication List     STOP taking these medications    naproxen 500 MG tablet Commonly known as: NAPROSYN       TAKE these medications    acetaminophen 500 MG tablet Commonly known as: TYLENOL Take 1  tablet (500 mg total) by mouth every 6 (six) hours as needed.   clindamycin 300 MG capsule Commonly known as: CLEOCIN Take 1 capsule (300 mg total) by mouth 3 (three) times daily for 7 days. Start taking on: November 03, 2023 What changed: See the new instructions.   fluconazole 100 MG tablet Commonly known as: DIFLUCAN Take 1 tablet by mouth daily.   FreeStyle Libre 14 Day Sensor Misc 1 Units by Does not apply route every 14 (fourteen) days.   HumuLIN R U-500 KwikPen 500 UNIT/ML KwikPen Generic drug: insulin regular human CONCENTRATED Inject 90-100 Units into the skin 2 (two) times daily with a meal. INJECT 100 UNITS AT BREAKFAST AND 90 UNITS AT  DINNER. What changed:  how much to take how to take this when to take this additional instructions   ondansetron 4 MG tablet Commonly known as: Zofran Take 1 tablet (4 mg total) by mouth every 8 (eight) hours as needed for nausea or vomiting.   pantoprazole 40 MG tablet Commonly known as: PROTONIX Take 1 tablet every day by oral route.   Pen Needles 33G X 4 MM Misc 1 each by Other route See admin instructions. Use 1 pen needle to inject insulin 4 times daily.   saccharomyces boulardii 250 MG capsule Commonly known as: Florastor Take 1 capsule (250 mg total) by mouth 2 (two) times daily for 14 days.        Procedures/Studies: US PELVIC COMPLETE WITH  TRANSVAGINAL Result Date: 10/31/2023 CLINICAL DATA:  Pelvic mass on prior CT EXAM: TRANSABDOMINAL AND TRANSVAGINAL ULTRASOUND OF PELVIS TECHNIQUE: Both transabdominal and transvaginal ultrasound examinations of the pelvis were performed. Transabdominal technique was performed for global imaging of the pelvis including uterus, ovaries, adnexal regions, and pelvic cul-de-sac. It was necessary to proceed with endovaginal exam following the transabdominal exam to visualize the uterus, endometrium, ovaries and adnexa. COMPARISON:  CT 10/30/2023 FINDINGS: Uterus Measurements: 7.2 x 4.7 x 5.8 cm = volume: 102 mL. 2.1 cm with right intramural fibroid. Uterus is retroverted. Endometrium Thickness: Normal thickness, 3 mm.  No focal abnormality visualized. Right ovary Measurements: 2.6 x 1.4 x 2.5 cm = volume: 5 mL. Normal appearance/no adnexal mass. Left ovary Measurements: 3.4 x 1.6 x 2.0 cm = volume: 6 mL. Normal appearance/no adnexal mass. Other findings No free fluid. There is a large right adnexal solid mass measuring 6.1 x 4.9 x 4.0 cm. This corresponds to the pelvic mass seen on CT. This has echotexture and appearance similar to the uterus and may reflect a large pedunculated fibroid, but exact connection to the uterus cannot be visualized. IMPRESSION: Fibroid uterus. 6.1 cm right adnexal solid mass with echotexture similar to the uterus. This may reflect a pedunculated fibroid, but this cannot be confirmed by ultrasound. When the patient is clinically stable and able to follow directions and hold their breath (preferably as an outpatient) further evaluation with dedicated pelvic MRI should be considered. Electronically Signed   By: Charlett Nose M.D.   On: 10/31/2023 18:19   CT PELVIS WO CONTRAST Result Date: 10/30/2023 CLINICAL DATA:  Abscess to the left buttocks x1 week. EXAM: CT PELVIS WITHOUT CONTRAST TECHNIQUE: Multidetector CT imaging of the pelvis was performed following the standard protocol without  intravenous contrast. RADIATION DOSE REDUCTION: This exam was performed according to the departmental dose-optimization program which includes automated exposure control, adjustment of the mA and/or kV according to patient size and/or use of iterative reconstruction technique. COMPARISON:  None Available. FINDINGS: Urinary Tract:  No  abnormality visualized. Bowel:  Unremarkable visualized pelvic bowel loops. Vascular/Lymphatic: No pathologically enlarged lymph nodes. No significant vascular abnormality seen. Reproductive: A 6.3 cm x 4.7 cm x 6.2 cm well-defined area of soft tissue attenuation (approximately 53.33 Hounsfield units) is seen within the right adnexa. This represents a new finding when compared to the prior study. Other:  None. Musculoskeletal: Moderate severity left-sided perirectal inflammatory fat stranding is seen. This extends inferiorly along the left gluteal fold. A 6.5 mm x 12.6 mm x 11.9 mm ill-defined area of fluid attenuation (approximately 23.07 Hounsfield units) is seen within the perirectal region on the left (axial CT image 121, CT series 5/coronal reformatted image 110, CT series 8). No acute osseous abnormalities are identified. IMPRESSION: 1. Moderate severity left-sided perirectal inflammatory fat stranding with a small left perirectal abscess. 2. 6.3 cm x 4.7 cm x 6.2 cm well-defined area of soft tissue attenuation within the right adnexa. While this may represent a hemorrhagic cyst or exophytic uterine fibroid, further evaluation with pelvic ultrasound is recommended. Electronically Signed   By: Aram Candela M.D.   On: 10/30/2023 21:49     Subjective: Pt sitting up in chair, wound continues to drain, not painful anymore. No fever or chills.  Blood sugars much better controlled.   Discharge Exam: Vitals:   11/01/23 2036 11/02/23 0344  BP: 121/66 109/65  Pulse: 79 71  Resp: 20 19  Temp: 98.5 F (36.9 C) 97.6 F (36.4 C)  SpO2: 100% 100%   Vitals:   11/01/23 0353  11/01/23 1355 11/01/23 2036 11/02/23 0344  BP: 105/63 116/64 121/66 109/65  Pulse: 82 80 79 71  Resp: 20  20 19   Temp: 97.9 F (36.6 C) 98.3 F (36.8 C) 98.5 F (36.9 C) 97.6 F (36.4 C)  TempSrc: Oral Oral Oral Oral  SpO2: 99% 99% 100% 100%  Weight:      Height:       General exam: Appears calm and moderately uncomfortable, m.o. Respiratory system: Clear to auscultation. Respiratory effort normal. Cardiovascular system: normal S1 & S2 heard. No JVD, murmurs, rubs, gallops or clicks. No pedal edema. Gastrointestinal system: Abdomen is nondistended, soft and nontender. No organomegaly or masses felt. Normal bowel sounds heard. Central nervous system: Alert and oriented. No focal neurological deficits. Extremities: Symmetric 5 x 5 power. Skin: abscess continues to drain Psychiatry: Judgement and insight appear normal. Mood & affect appropriate.    The results of significant diagnostics from this hospitalization (including imaging, microbiology, ancillary and laboratory) are listed below for reference.     Microbiology: Recent Results (from the past 240 hours)  Aerobic/Anaerobic Culture w Gram Stain (surgical/deep wound)     Status: None (Preliminary result)   Collection Time: 10/31/23 10:40 AM   Specimen: Abscess  Result Value Ref Range Status   Specimen Description   Final    ABSCESS Performed at [facility], [address] [place], [place] [address]    Special Requests   Final    NONE Performed at [facility], [address], [place], [place] [address]    Gram Stain   Final    ABUNDANT WBC PRESENT, PREDOMINANTLY PMN RARE GRAM POSITIVE COCCI IN PAIRS    Culture   Final    MODERATE GROUP B STREP(S.AGALACTIAE)ISOLATED TESTING AGAINST S. AGALACTIAE NOT ROUTINELY PERFORMED DUE TO PREDICTABILITY OF AMP/PEN/VAN SUSCEPTIBILITY. Performed at [facility], [address], [place], [place] [address]    Report Status PENDING  Incomplete     Labs: BNP (last  3  results) No results for input(s): "BNP" in the last 8760 hours. Basic Metabolic Panel: Recent Labs  Lab 10/30/23 1827 10/31/23 0439  NA 134* 134*  K 3.6 3.6  CL 97* 100  CO2 25 25  GLUCOSE 308* 356*  BUN 13 13  CREATININE 0.85 0.75  CALCIUM 9.1 8.6*   Liver Function Tests: Recent Labs  Lab 10/30/23 1827  AST 17  ALT 15  ALKPHOS 73  BILITOT 0.6  PROT 7.8  ALBUMIN 3.5   No results for input(s): "LIPASE", "AMYLASE" in the last 168 hours. No results for input(s): "AMMONIA" in the last 168 hours. CBC: Recent Labs  Lab 10/30/23 1827 10/31/23 0439 11/01/23 0800  WBC 16.0* 15.2* 10.5  NEUTROABS 12.0*  --  7.5  HGB 13.9 12.3 12.4  HCT 43.0 38.7 39.1  MCV 85.7 86.4 87.9  PLT 307 271 304   Cardiac Enzymes: No results for input(s): "CKTOTAL", "CKMB", "CKMBINDEX", "TROPONINI" in the last 168 hours. BNP: Invalid input(s): "POCBNP" CBG: Recent Labs  Lab 11/01/23 1631 11/01/23 2039 11/02/23 0346 11/02/23 0721 11/02/23 1119  GLUCAP 301* 118* 111* 158* 230*   D-Dimer No results for input(s): "DDIMER" in the last 72 hours. Hgb A1c Recent Labs    10/31/23 1012  HGBA1C 14.8*   Lipid Profile No results for input(s): "CHOL", "HDL", "LDLCALC", "TRIG", "CHOLHDL", "LDLDIRECT" in the last 72 hours. Thyroid function studies No results for input(s): "TSH", "T4TOTAL", "T3FREE", "THYROIDAB" in the last 72 hours.  Invalid input(s): "FREET3" Anemia work up No results for input(s): "VITAMINB12", "FOLATE", "FERRITIN", "TIBC", "IRON", "RETICCTPCT" in the last 72 hours. Urinalysis    Component Value Date/Time   COLORURINE YELLOW 04/13/2018 1500   APPEARANCEUR HAZY (A) 04/13/2018 1500   LABSPEC 1.016 04/13/2018 1500   PHURINE 6.0 04/13/2018 1500   GLUCOSEU NEGATIVE 04/13/2018 1500   GLUCOSEU NEGATIVE 09/18/2017 0956   HGBUR NEGATIVE 04/13/2018 1500   HGBUR negative 04/13/2009 1048   BILIRUBINUR NEGATIVE 04/13/2018 1500   KETONESUR NEGATIVE 04/13/2018 1500   PROTEINUR  NEGATIVE 04/13/2018 1500   UROBILINOGEN 1.0 09/18/2017 0956   NITRITE POSITIVE (A) 04/13/2018 1500   LEUKOCYTESUR NEGATIVE 04/13/2018 1500   Sepsis Labs Recent Labs  Lab 10/30/23 1827 10/31/23 0439 11/01/23 0800  WBC 16.0* 15.2* 10.5   Microbiology Recent Results (from the past 240 hours)  Aerobic/Anaerobic Culture w Gram Stain (surgical/deep wound)     Status: None (Preliminary result)   Collection Time: 10/31/23 10:40 AM   Specimen: Abscess  Result Value Ref Range Status   Specimen Description   Final    ABSCESS Performed at [facility], [address], [place], [place] [address]    Special Requests   Final    NONE Performed at [facility], [address], [place], [place] [address]    Gram Stain   Final    ABUNDANT WBC PRESENT, PREDOMINANTLY PMN RARE GRAM POSITIVE COCCI IN PAIRS    Culture   Final    MODERATE GROUP B STREP(S.AGALACTIAE)ISOLATED TESTING AGAINST S. AGALACTIAE NOT ROUTINELY PERFORMED DUE TO PREDICTABILITY OF AMP/PEN/VAN SUSCEPTIBILITY. Performed at [facility], [address] [place], [place] [address]    Report Status PENDING  Incomplete   Time coordinating discharge:  37 mins   SIGNED:  Standley Dakins, MD  [facility] 11/02/2023, 1:41 PM How to contact the [facility] Attending or Consulting provider 7A - 7P or covering provider during after hours 7P -7A, for this patient?  Check the care team in [facility] and look  for a) attending/consulting [facility] provider listed and b) the [facility] team listed Log into [email or web address] and use [facility]'s universal password to access. If you do not have the password, please contact the hospital operator. Locate the [facility] provider you are looking for under [facility] and page to a number that you can be directly reached. If you still have difficulty reaching the provider, please page the [facility] (Director on Call) for the Hospitalists listed on amion for assistance.

## 2023-11-06 LAB — AEROBIC/ANAEROBIC CULTURE W GRAM STAIN (SURGICAL/DEEP WOUND)

## 2023-11-11 ENCOUNTER — Ambulatory Visit: Admitting: General Surgery

## 2023-11-17 ENCOUNTER — Other Ambulatory Visit: Payer: Self-pay | Admitting: Obstetrics & Gynecology

## 2023-11-17 DIAGNOSIS — R1900 Intra-abdominal and pelvic swelling, mass and lump, unspecified site: Secondary | ICD-10-CM

## 2023-11-18 ENCOUNTER — Encounter: Payer: Self-pay | Admitting: Obstetrics & Gynecology

## 2023-11-19 ENCOUNTER — Ambulatory Visit: Admitting: Surgery

## 2023-11-19 ENCOUNTER — Encounter: Payer: Self-pay | Admitting: Surgery

## 2023-11-19 VITALS — BP 136/86 | HR 89 | Temp 98.0°F | Resp 14 | Ht 66.0 in | Wt 301.0 lb

## 2023-11-19 DIAGNOSIS — K611 Rectal abscess: Secondary | ICD-10-CM

## 2023-11-19 NOTE — Patient Instructions (Signed)
 Wound Care instructions:  -Continue sitz baths after bowel movements if there is concern that the area was contaminated with stool -Apply antibiotic ointment to the area -Call if increasing drainage, pain, fever, or chills

## 2023-11-19 NOTE — Progress Notes (Signed)
 [facility] Note   HPI:  43 y.o. Female presents to clinic for follow-up after hospitalization for a perirectal abscess.  Since being discharged home from the hospital, she has been doing very well.  She has been performing sitz bath's to her bottom.  She denies any drainage or pain associated with the abscess site.  She is moving her bowels without issue.  Her only complaint is a little bit of itching at her previous abscess site.  Denies fevers and chills.  Review of Systems:  All other review of systems: otherwise negative   Vital Signs:  BP 136/86   Pulse 89   Temp 98 F (36.7 C) (Oral)   Resp 14   Ht 5\' 6"  (1.676 m)   Wt (!) 301 lb (136.5 kg)   LMP 10/25/2023   SpO2 94%   BMI 48.58 kg/m    Physical Exam:  Physical Exam Vitals reviewed.  Constitutional:      Appearance: Normal appearance.  Genitourinary:    Comments: 1.5 cm area of scarring with less than 0.5 cm opening with pink granulation tissue at the base, mild surrounding induration, significantly improved from evaluation in hospital, no surrounding erythema, nontender to palpation Neurological:     Mental Status: She is alert.     Laboratory studies: None  Imaging:  None  Assessment:  43 y.o. yo Female who presents for follow-up of a perirectal abscess after hospital admission and I&D in the emergency department  Plan:  -Patient has been doing very well since surgery with no significant pain or drainage from the previous abscess site -Advised that the area is healing very well.  She can apply antibiotic ointment to the previous I&D site, and she should only perform sitz bath's after bowel movements if she is concerned the area was contaminated with stool -Scheduled follow-up with patient in 1 month.  Advised that she can cancel this appointment if she has no further issues -Advised that she should call the office if she begins to have worsening drainage, pain, fever, or chills  All of the  above recommendations were discussed with the patient, and all of patient's questions were answered to her expressed satisfaction.  Note: Portions of this report may have been transcribed using voice recognition software. Every effort has been made to ensure accuracy; however, inadvertent computerized transcription errors may still be present.   Theophilus Kinds, DO [facility] [address] [place], [place] [address] [phone] (office)

## 2023-11-21 ENCOUNTER — Ambulatory Visit
Admission: RE | Admit: 2023-11-21 | Discharge: 2023-11-21 | Disposition: A | Discharge status: Home / Self Care | Source: Ambulatory Visit | Attending: Obstetrics & Gynecology | Admitting: Obstetrics & Gynecology

## 2023-11-21 DIAGNOSIS — R1900 Intra-abdominal and pelvic swelling, mass and lump, unspecified site: Secondary | ICD-10-CM

## 2023-11-21 MED ORDER — GADOPICLENOL 0.5 MMOL/ML IV SOLN
10.0000 mL | Freq: Once | INTRAVENOUS | Status: AC | PRN
  Administered 2023-11-21: 10 mL via INTRAVENOUS

## 2023-12-18 ENCOUNTER — Ambulatory Visit: Admitting: Surgery

## 2024-01-27 ENCOUNTER — Ambulatory Visit: Payer: Self-pay

## 2024-01-27 ENCOUNTER — Telehealth: Admitting: Emergency Medicine

## 2024-01-27 DIAGNOSIS — R3989 Other symptoms and signs involving the genitourinary system: Secondary | ICD-10-CM

## 2024-01-27 MED ORDER — SULFAMETHOXAZOLE-TRIMETHOPRIM 800-160 MG PO TABS: 1.0000 | ORAL_TABLET | Freq: Two times a day (BID) | ORAL | 0 refills | Status: AC

## 2024-01-27 NOTE — Patient Instructions (Signed)
  Elyshia N Steinhardt, thank you for joining Blinda Burger, NP for today's virtual visit.  While this provider is not your primary care provider (PCP), if your PCP is located in our provider database this encounter information will be shared with them immediately following your visit.   A [facility] MyChart account gives you access to today's visit and all your visits, tests, and labs performed at [facility] " click here if you don't have a [facility] MyChart account or go to [email or web address]  Consent: (Patient) Julie Berry provided verbal consent for this virtual visit at the beginning of the encounter.  Current Medications:  Current Outpatient Medications:    sulfamethoxazole-trimethoprim (BACTRIM DS) 800-160 MG tablet, Take 1 tablet by mouth 2 (two) times daily for 3 days., Disp: 6 tablet, Rfl: 0   acetaminophen  (TYLENOL ) 500 MG tablet, Take 1 tablet (500 mg total) by mouth every 6 (six) hours as needed., Disp: 30 tablet, Rfl: 0   Continuous Blood Gluc Sensor (FREESTYLE LIBRE 14 DAY SENSOR) MISC, 1 Units by Does not apply route every 14 (fourteen) days., Disp: 2 each, Rfl: 4   fluconazole  (DIFLUCAN ) 100 MG tablet, Take 1 tablet by mouth daily., Disp: , Rfl:    Insulin  Pen Needle (PEN NEEDLES) 33G X 4 MM MISC, 1 each by Other route See admin instructions. Use 1 pen needle to inject insulin  4 times daily., Disp: 120 each, Rfl: 3   insulin  regular human CONCENTRATED (HUMULIN  R U-500 KWIKPEN) 500 UNIT/ML KwikPen, Inject 90-100 Units into the skin 2 (two) times daily with a meal. INJECT 100 UNITS AT BREAKFAST AND 90 UNITS AT  DINNER., Disp: , Rfl:    ondansetron  (ZOFRAN ) 4 MG tablet, Take 1 tablet (4 mg total) by mouth every 8 (eight) hours as needed for nausea or vomiting., Disp: 20 tablet, Rfl: 0   pantoprazole  (PROTONIX ) 40 MG tablet, Take 1 tablet every day by oral route., Disp: , Rfl:    Medications ordered in this encounter:  Meds ordered this encounter   Medications   sulfamethoxazole-trimethoprim (BACTRIM DS) 800-160 MG tablet    Sig: Take 1 tablet by mouth 2 (two) times daily for 3 days.    Dispense:  6 tablet    Refill:  0     *If you need refills on other medications prior to your next appointment, please contact your pharmacy*  Follow-Up: Call back or seek an in-person evaluation if the symptoms worsen or if the condition fails to improve as anticipated.  [facility] [phone]  Other Instructions  Follow up with Dr. Lewayne Records or someone at his office to get your urine tested if this treatment does not help you get better or if you get worse.    If you have been instructed to have an in-person evaluation today at a local Urgent Care facility, please use the link below. It will take you to a list of all of our available [facility], including address, phone number and hours of operation. Please do not delay care.  [facility]  If you or a family member do not have a primary care provider, use the link below to schedule a visit and establish care. When you choose a [facility] primary care physician or advanced practice provider, you gain a long-term partner in health. Find a Primary Care Provider  Learn more about [facility]'s in-office and virtual care options: [facility] - Get Care Now
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
## 2024-01-29 ENCOUNTER — Ambulatory Visit

## 2024-04-22 ENCOUNTER — Other Ambulatory Visit (HOSPITAL_COMMUNITY): Payer: Self-pay | Admitting: Family Medicine

## 2024-04-22 DIAGNOSIS — Z1231 Encounter for screening mammogram for malignant neoplasm of breast: Secondary | ICD-10-CM

## 2024-06-02 ENCOUNTER — Encounter (INDEPENDENT_AMBULATORY_CARE_PROVIDER_SITE_OTHER): Payer: Self-pay | Admitting: Gastroenterology

## 2024-06-07 ENCOUNTER — Ambulatory Visit (HOSPITAL_COMMUNITY)
Admission: RE | Admit: 2024-06-07 | Discharge: 2024-06-07 | Disposition: A | Discharge status: Home / Self Care | Source: Ambulatory Visit | Attending: Family Medicine | Admitting: Family Medicine

## 2024-06-07 DIAGNOSIS — Z1231 Encounter for screening mammogram for malignant neoplasm of breast: Secondary | ICD-10-CM | POA: Insufficient documentation

## 2024-06-09 LAB — HEMOGLOBIN A1C: Hemoglobin A1C: 9.1

## 2024-06-24 ENCOUNTER — Encounter: Payer: Self-pay | Admitting: Family Medicine

## 2024-06-24 ENCOUNTER — Ambulatory Visit: Admitting: Family Medicine

## 2024-06-24 VITALS — BP 145/84 | HR 82 | Ht 66.0 in | Wt 312.0 lb

## 2024-06-24 DIAGNOSIS — E1165 Type 2 diabetes mellitus with hyperglycemia: Secondary | ICD-10-CM | POA: Diagnosis not present

## 2024-06-24 DIAGNOSIS — I10 Essential (primary) hypertension: Secondary | ICD-10-CM

## 2024-06-24 DIAGNOSIS — Z794 Long term (current) use of insulin: Secondary | ICD-10-CM | POA: Diagnosis not present

## 2024-06-24 NOTE — Assessment & Plan Note (Signed)
 Patient is currently under the care of Endocrinology for management of Type 2 diabetes mellitus. Most recent Hemoglobin A1c was 9.1% (October 2025), indicating suboptimal glycemic control. Encouraged patient to continue close follow-up with Endocrinology and maintain adherence to prescribed treatment regimen. Reinforced the importance of dietary modification, medication compliance, and routine blood glucose monitoring. Discussed potential benefits of regular physical activity and limiting high-sugar foods and beverages to improve glycemic control. Will continue to monitor progress and coordinate care with Endocrinology as needed. Patient verbalized understanding.

## 2024-06-24 NOTE — Assessment & Plan Note (Addendum)
 Uncontrolled blood pressure noted in clinic today. The patient reports taking Olmesartan 40 mg daily and Metoprolol  25 mg daily with adequate home blood pressure control. No medication changes made today, as home readings appear well controlled. Encouraged continuation of current antihypertensive regimen. Reinforced lifestyle modifications: maintain a low-sodium diet and increase physical activity as tolerated. Advised patient to monitor ambulatory blood pressure regularly and report consistently elevated readings >140/90 mmHg. Emergency precautions: instructed to seek medical attention for readings >=180/120 mmHg or symptoms such as severe headache, chest pain, shortness of breath, or vision changes. Patient verbalized understanding.
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
## 2024-06-24 NOTE — Patient Instructions (Signed)
 I appreciate the opportunity to provide care to you today!    Follow up:  3 months  Labs: next visit  For a Healthier YOU, I Recommend: Reducing your intake of sugar, sodium, carbohydrates, and saturated fats. Increasing your fiber intake by incorporating more whole grains, fruits, and vegetables into your meals. Setting healthy goals with a focus on lowering your consumption of carbs, sugar, and unhealthy fats. Adding variety to your diet by including a wide range of fruits and vegetables. Cutting back on soda and limiting processed foods as much as possible. Staying active: In addition to taking your weight loss medication, aim for at least 150 minutes of moderate-intensity physical activity each week for optimal results.   Please follow up if your symptoms worsen or fail to improve.    Please continue to a heart-healthy diet and increase your physical activities. Try to exercise for at least five days a week.    It was a pleasure to see you and I look forward to continuing to work together on your health and well-being. Please do not hesitate to call the office if you need care or have questions about your care.  In case of emergency, please visit the Emergency Department for urgent care, or contact our clinic at [phone] to schedule an appointment. We're here to help you!   Have a wonderful day and week. With Gratitude, Meade JENEANE Gerlach MSN, FNP-BC, PMHNP-BC

## 2024-06-24 NOTE — Assessment & Plan Note (Signed)
 For optimal results with weight loss, I recommend: Decreasing portion sizes. Reducing sugar, sodium, and carbohydrate intake, and limiting saturated fats in your diet. Increasing your fiber intake by incorporating more whole grains, fruits, and vegetables. Setting healthy goals and focusing on lowering carbs, sugar, and fat. Increasing the variety of fruits and vegetables in your diet. Reducing soda consumption and limiting processed foods. In addition to taking your weight loss medication, engage in moderate-intensity physical activity for at least 150 minutes per week for the best results.

## 2024-06-25 ENCOUNTER — Other Ambulatory Visit: Payer: Self-pay | Admitting: Obstetrics and Gynecology

## 2024-07-05 ENCOUNTER — Encounter: Payer: Self-pay | Admitting: Internal Medicine

## 2024-07-05 ENCOUNTER — Ambulatory Visit: Admitting: Internal Medicine

## 2024-07-05 ENCOUNTER — Telehealth (INDEPENDENT_AMBULATORY_CARE_PROVIDER_SITE_OTHER): Admitting: Internal Medicine

## 2024-07-05 VITALS — Ht 66.0 in | Wt 312.0 lb

## 2024-07-05 DIAGNOSIS — F331 Major depressive disorder, recurrent, moderate: Secondary | ICD-10-CM | POA: Insufficient documentation

## 2024-07-05 DIAGNOSIS — F411 Generalized anxiety disorder: Secondary | ICD-10-CM | POA: Diagnosis not present

## 2024-07-05 MED ORDER — HYDROXYZINE PAMOATE 25 MG PO CAPS: 25.0000 mg | ORAL_CAPSULE | Freq: Two times a day (BID) | ORAL | 2 refills | Status: DC | PRN

## 2024-07-05 MED ORDER — SERTRALINE HCL 50 MG PO TABS: 50.0000 mg | ORAL_TABLET | Freq: Every day | ORAL | 3 refills | Status: DC

## 2024-07-05 NOTE — Progress Notes (Signed)
 Virtual Visit via Video Note   Because of [name]'s co-morbid illnesses, she is at least at moderate risk for complications without adequate follow up.  This format is felt to be most appropriate for this patient at this time.  All issues noted in this document were discussed and addressed.  A limited physical exam was performed with this format.      Evaluation Performed:  Follow-up visit  Date:  07/05/2024   ID:  Julie Berry, DOB 1980-10-21, MRN [id number]  Patient Location: Home Provider Location: Office/Clinic  Participants: Patient Location of Patient: Home Location of Provider: Telehealth Consent was obtain for visit to be over via telehealth. I verified that I am speaking with the correct person using two identifiers.  PCP:  Edman Meade PEDLAR, FNP   Chief Complaint: Anxiety and depression  History of Present Illness:    Julie Berry is a 43 y.o. female who has a video visit for discussion of anxiety symptoms.  She reports being stressed since her mother's death in 08-05-2020.  She has a custody of her sibling, and feels overwhelmed with the care and her current work.  She has difficulty sleeping at times.  She reports feeling fatigued and anhedonia.  Denies any SI or HI currently.  The patient does not have symptoms concerning for COVID-19 infection (fever, chills, cough, or new shortness of breath).   Past Medical, Surgical, Social History, Allergies, and Medications have been Reviewed.  Past Medical History:  Diagnosis Date   Complication of anesthesia    heartrate dropped with anesthesia   Depressed    Diabetes mellitus without complication (HCC)    Gallstones    HTN (hypertension)    pt denies   Hyperglycemia    Leukocytosis    Low back pain    Lump or mass in breast    Malaise and fatigue    Morbid obesity (HCC)    Sciatica    Sleep apnea    UTI (urinary tract infection)    Past Surgical History:  Procedure Laterality Date    CHOLECYSTECTOMY  06/22/2012   Procedure: LAPAROSCOPIC CHOLECYSTECTOMY;  Surgeon: Oneil DELENA Budge, MD;  Location: [facility];  Service: General;  Laterality: N/A;   ESOPHAGOGASTRODUODENOSCOPY N/A 06/09/2014   Procedure: ESOPHAGOGASTRODUODENOSCOPY (EGD);  Surgeon: Claudis RAYMOND Rivet, MD;  Location: [facility];  Service: Endoscopy;  Laterality: N/A;  200-moved to 145 Ann to notify pt     Current Meds  Medication Sig   atorvastatin (LIPITOR) 80 MG tablet Take 80 mg by mouth daily.   ezetimibe (ZETIA) 10 MG tablet Take 10 mg by mouth.   Insulin  Pen Needle (PEN NEEDLES) 33G X 4 MM MISC 1 each by Other route See admin instructions. Use 1 pen needle to inject insulin  4 times daily.   insulin  regular human CONCENTRATED (HUMULIN  R U-500 KWIKPEN) 500 UNIT/ML KwikPen Inject 90-100 Units into the skin 2 (two) times daily with a meal. INJECT 100 UNITS AT BREAKFAST AND 90 UNITS AT  DINNER.     Allergies:   Amoxicillin, Penicillins, Dilaudid   [hydromorphone  hcl], Meloxicam , Peanut (diagnostic), Contrast media [iodinated contrast media], Latex, and Trulicity  [dulaglutide ]   ROS:   Please see the history of present illness. All other systems reviewed and are negative.   Labs/Other Tests and Data Reviewed:    Recent Labs: 10/30/2023: ALT 15 10/31/2023: BUN 13; Creatinine, Ser 0.75; Potassium 3.6; Sodium 134 11/01/2023: Hemoglobin 12.4; Platelets 304   Recent Lipid Panel Lab  Results  Component Value Date/Time   CHOL 165 03/22/2019 08:20 AM   TRIG 85.0 03/22/2019 08:20 AM   HDL 34.90 (L) 03/22/2019 08:20 AM   CHOLHDL 5 03/22/2019 08:20 AM   LDLCALC 113 (H) 03/22/2019 08:20 AM   LDLDIRECT 112.0 07/19/2016 03:09 PM    Wt Readings from Last 3 Encounters:  07/05/24 (!) 312 lb (141.5 kg)  06/24/24 (!) 312 lb (141.5 kg)  11/19/23 (!) 301 lb (136.5 kg)     Objective:    Vital Signs:  Ht 5' 6 (1.676 m)   Wt (!) 312 lb (141.5 kg)   BMI 50.36 kg/m    VITAL SIGNS:  reviewed GEN:  no acute  distress EYES:  sclerae anicteric, EOMI - Extraocular Movements Intact RESPIRATORY:  normal respiratory effort, symmetric expansion NEURO:  alert and oriented x 3, no obvious focal deficit PSYCH:  Flat affect.  Cooperative.  ASSESSMENT & PLAN:    Assessment & Plan GAD (generalized anxiety disorder) Uncontrolled Feels overwhelmed with the care of her sibling, her current work and her type II DM Started Zoloft at 25 mg QD for 2 weeks and then increase to 50 mg QD Hydroxyzine as needed for anxiety/insomnia Simple relaxation techniques material provided Moderate episode of recurrent major depressive disorder (HCC) Uncontrolled, likely has complex grief as well Started Zoloft 25 mg QD for 2 weeks and then 50 mg QD If persistent, will refer to [facility] therapy    I discussed the assessment and treatment plan with the patient. The patient was provided an opportunity to ask questions, and all were answered. The patient agreed with the plan and demonstrated an understanding of the instructions.   The patient was advised to call back or seek an in-person evaluation if the symptoms worsen or if the condition fails to improve as anticipated.  The above assessment and management plan was discussed with the patient. The patient verbalized understanding of and has agreed to the management plan.   Medication Adjustments/Labs and Tests Ordered: Current medicines are reviewed at length with the patient today.  Concerns regarding medicines are outlined above.   Tests Ordered: No orders of the defined types were placed in this encounter.   Medication Changes: No orders of the defined types were placed in this encounter.    Note: This dictation was prepared with Dragon dictation along with smaller phrase technology. Similar sounding words can be transcribed inadequately or may not be corrected upon review. Any transcriptional errors that result from this process are unintentional.      Disposition:   Follow up  Signed, Suzzane MARLA Blanch, MD  07/05/2024 8:20 AM     [facility]

## 2024-07-05 NOTE — Patient Instructions (Signed)
 Please start taking Zoloft 25 mg once daily for 2 weeks and then 50 mg once daily.  Please take hydroxyzine as needed for anxiety/insomnia.  Please maintain simple sleep hygiene. - Maintain dark and non-noisy environment in the bedroom. - Please use the bedroom for sleep and sexual activity only. - Do not use electronic devices in the bedroom. - Please take dinner at least 2 hours before bedtime. - Please avoid caffeinated products in the evening, including coffee, soft drinks. - Please try to maintain the regular sleep-wake cycle - Go to bed and wake up at the same time.

## 2024-07-05 NOTE — Assessment & Plan Note (Addendum)
 Uncontrolled Feels overwhelmed with the care of her sibling, her current work and her type II DM Started Zoloft at 25 mg QD for 2 weeks and then increase to 50 mg QD Hydroxyzine as needed for anxiety/insomnia Simple relaxation techniques material provided

## 2024-07-05 NOTE — Assessment & Plan Note (Addendum)
 Uncontrolled, likely has complex grief as well Started Zoloft 25 mg QD for 2 weeks and then 50 mg QD If persistent, will refer to [facility] therapy

## 2024-07-10 ENCOUNTER — Encounter: Payer: Self-pay | Admitting: Internal Medicine

## 2024-07-12 ENCOUNTER — Ambulatory Visit
Admission: RE | Admit: 2024-07-12 | Discharge: 2024-07-12 | Disposition: A | Discharge status: Home / Self Care | Attending: Family Medicine | Admitting: Family Medicine

## 2024-07-12 VITALS — BP 111/64 | HR 98 | Temp 98.3°F | Resp 20

## 2024-07-12 DIAGNOSIS — N390 Urinary tract infection, site not specified: Secondary | ICD-10-CM | POA: Diagnosis present

## 2024-07-12 LAB — POCT URINE DIPSTICK
Bilirubin, UA: NEGATIVE
Glucose, UA: NEGATIVE mg/dL
Ketones, POC UA: NEGATIVE mg/dL
Nitrite, UA: NEGATIVE
POC PROTEIN,UA: 100 — AB
Spec Grav, UA: >=1.03 — AB (ref 1.010–1.025)
Urobilinogen, UA: 0.2 U/dL
pH, UA: 5.5 (ref 5.0–8.0)

## 2024-07-12 MED ORDER — SULFAMETHOXAZOLE-TRIMETHOPRIM 800-160 MG PO TABS: 1.0000 | ORAL_TABLET | Freq: Two times a day (BID) | ORAL | 0 refills | Status: AC

## 2024-07-12 NOTE — ED Triage Notes (Signed)
 Pt reports urinary discomfort started this morning denies any abdominal pain or back pain.
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
## 2024-07-14 ENCOUNTER — Ambulatory Visit: Payer: Self-pay

## 2024-07-14 LAB — URINE CULTURE: Culture: >=100000 — AB

## 2024-07-26 NOTE — Telephone Encounter (Signed)
 Appointment scheduled 1/29.

## 2024-07-27 ENCOUNTER — Ambulatory Visit

## 2024-08-03 ENCOUNTER — Encounter: Payer: Self-pay | Admitting: Internal Medicine

## 2024-08-03 ENCOUNTER — Ambulatory Visit: Admitting: Internal Medicine

## 2024-08-03 VITALS — BP 139/80 | HR 87 | Ht 66.0 in | Wt 307.6 lb

## 2024-08-03 DIAGNOSIS — F331 Major depressive disorder, recurrent, moderate: Secondary | ICD-10-CM

## 2024-08-03 DIAGNOSIS — J029 Acute pharyngitis, unspecified: Secondary | ICD-10-CM

## 2024-08-03 DIAGNOSIS — F411 Generalized anxiety disorder: Secondary | ICD-10-CM

## 2024-08-03 DIAGNOSIS — E1165 Type 2 diabetes mellitus with hyperglycemia: Secondary | ICD-10-CM

## 2024-08-03 NOTE — Assessment & Plan Note (Addendum)
 Uncontrolled, likely has complex grief as well Continue Zoloft  50 mg QD If persistent, will refer to [facility] therapy
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
## 2024-08-03 NOTE — Patient Instructions (Signed)
 Please continue to take medications as prescribed.  Please continue to follow low carb diet and perform moderate exercise/walking at least 150 mins/week.

## 2024-08-03 NOTE — Assessment & Plan Note (Addendum)
 Lab Results  Component Value Date   HGBA1C 9.1 06/09/2024   Uncontrolled, but improving On Humulin , followed by endocrinology Advised to follow diabetic diet Has had fluctuation in glycemic profile, FMLA paperwork provided

## 2024-08-03 NOTE — Assessment & Plan Note (Addendum)
 Uncontrolled Feels overwhelmed with the care of her sibling, her current work and her type II DM Continue Zoloft  50 mg QD as she had improvement in her symptoms initially, recent worsening likely due to grief reaction Hydroxyzine  as needed for anxiety/insomnia Simple relaxation techniques material provided

## 2024-08-06 ENCOUNTER — Inpatient Hospital Stay (HOSPITAL_COMMUNITY): Admit: 2024-08-06 | Admitting: Obstetrics and Gynecology

## 2024-08-06 ENCOUNTER — Telehealth: Admitting: Internal Medicine

## 2024-08-06 ENCOUNTER — Encounter: Payer: Self-pay | Admitting: Internal Medicine

## 2024-08-06 DIAGNOSIS — J069 Acute upper respiratory infection, unspecified: Secondary | ICD-10-CM | POA: Diagnosis not present

## 2024-08-06 SURGERY — MYOMECTOMY, ABDOMINAL APPROACH
Anesthesia: General

## 2024-08-06 MED ORDER — AZITHROMYCIN 250 MG PO TABS: ORAL_TABLET | ORAL | 0 refills | Status: AC

## 2024-08-06 MED ORDER — BENZONATATE 200 MG PO CAPS: 200.0000 mg | ORAL_CAPSULE | Freq: Two times a day (BID) | ORAL | 0 refills | Status: DC | PRN

## 2024-08-06 NOTE — Progress Notes (Signed)
 "    Virtual Visit via Video Note   Because of Julie Berry's co-morbid illnesses, she is at least at moderate risk for complications without adequate follow up.  This format is felt to be most appropriate for this patient at this time.  All issues noted in this document were discussed and addressed.  A limited physical exam was performed with this format.      Evaluation Performed:  Follow-up visit  Date:  08/06/2024   ID:  Julie Berry, DOB May 13, 1981, MRN [id number]  Patient Location: Home Provider Location: Office/Clinic  Participants: Patient Location of Patient: Home Location of Provider: Telehealth Consent was obtain for visit to be over via telehealth. I verified that I am speaking with the correct person using two identifiers.  PCP:  Edman Meade PEDLAR, FNP   Chief Complaint: Sore throat  History of Present Illness:    Julie Berry is a 43 y.o. female who has a video visit for complaint of sore throat, nasal congestion, sinus pressure related headache and postnasal drip for the last 6 days.  She also reports dry cough for the last 3 days.  She initially was having only sore throat, but has progressed to other symptoms now.  Denies any fever or chills.  She also reports mild nausea, but denies any episode of vomiting recently.  Denies dyspnea or wheezing currently.  The patient does have symptoms concerning for COVID-19 infection (fever, chills, cough, or new shortness of breath).   Past Medical, Surgical, Social History, Allergies, and Medications have been Reviewed.  Past Medical History:  Diagnosis Date   Complication of anesthesia    heartrate dropped with anesthesia   Depressed    Diabetes mellitus without complication (HCC)    Gallstones    HTN (hypertension)    pt denies   Hyperglycemia    Leukocytosis    Low back pain    Lump or mass in breast    Malaise and fatigue    Morbid obesity (HCC)    Sciatica    Sleep apnea    UTI (urinary tract  infection)    Past Surgical History:  Procedure Laterality Date   CHOLECYSTECTOMY  06/22/2012   Procedure: LAPAROSCOPIC CHOLECYSTECTOMY;  Surgeon: Oneil DELENA Budge, MD;  Location: [facility];  Service: General;  Laterality: N/A;   ESOPHAGOGASTRODUODENOSCOPY N/A 06/09/2014   Procedure: ESOPHAGOGASTRODUODENOSCOPY (EGD);  Surgeon: Claudis RAYMOND Rivet, MD;  Location: [facility];  Service: Endoscopy;  Laterality: N/A;  200-moved to 145 Ann to notify pt     Active Medications[1]   Allergies:   Amoxicillin, Penicillins, Dilaudid   [hydromorphone  hcl], Meloxicam , Peanut (diagnostic), Contrast media [iodinated contrast media], Latex, and Trulicity  [dulaglutide ]   ROS:   Please see the history of present illness. All other systems reviewed and are negative.   Labs/Other Tests and Data Reviewed:    Recent Labs: 10/30/2023: ALT 15 10/31/2023: BUN 13; Creatinine, Ser 0.75; Potassium 3.6; Sodium 134 11/01/2023: Hemoglobin 12.4; Platelets 304   Recent Lipid Panel Lab Results  Component Value Date/Time   CHOL 165 03/22/2019 08:20 AM   TRIG 85.0 03/22/2019 08:20 AM   HDL 34.90 (L) 03/22/2019 08:20 AM   CHOLHDL 5 03/22/2019 08:20 AM   LDLCALC 113 (H) 03/22/2019 08:20 AM   LDLDIRECT 112.0 07/19/2016 03:09 PM    Wt Readings from Last 3 Encounters:  08/03/24 (!) 307 lb 9.6 oz (139.5 kg)  07/05/24 (!) 312 lb (141.5 kg)  06/24/24 (!) 312 lb (141.5 kg)  Objective:    Vital Signs:  LMP 07/01/2024 (Exact Date)    VITAL SIGNS:  reviewed GEN:  no acute distress EYES:  sclerae anicteric, EOMI - Extraocular Movements Intact RESPIRATORY:  normal respiratory effort, symmetric expansion NEURO:  alert and oriented x 3, no obvious focal deficit PSYCH:  normal affect  ASSESSMENT & PLAN:    URTI Since her symptoms are persistent more than 5 days, would avoid viral testing Started empiric azithromycin as she has persistent symptoms despite symptomatic treatment She has tried Mucinex with mild relief, but  had hyperglycemia with it Tessalon  as needed for cough Advised to use vaporizer and/or sinus inhaler as needed for nasal congestion Advised to use nasal saline spray as needed for nasal congestion  I discussed the assessment and treatment plan with the patient. The patient was provided an opportunity to ask questions, and all were answered. The patient agreed with the plan and demonstrated an understanding of the instructions.   The patient was advised to call back or seek an in-person evaluation if the symptoms worsen or if the condition fails to improve as anticipated.  The above assessment and management plan was discussed with the patient. The patient verbalized understanding of and has agreed to the management plan.   Medication Adjustments/Labs and Tests Ordered: Current medicines are reviewed at length with the patient today.  Concerns regarding medicines are outlined above.   Tests Ordered: No orders of the defined types were placed in this encounter.   Medication Changes: No orders of the defined types were placed in this encounter.    Note: This dictation was prepared with Dragon dictation along with smaller phrase technology. Similar sounding words can be transcribed inadequately or may not be corrected upon review. Any transcriptional errors that result from this process are unintentional.      Disposition:  Follow up  Signed, Suzzane MARLA Blanch, MD  08/06/2024 7:59 AM     [facility]    [1]  No outpatient medications have been marked as taking for the 08/06/24 encounter (Appointment) with Blanch Suzzane MARLA, MD.   "

## 2024-08-06 NOTE — Patient Instructions (Signed)
 Please start taking azithromycin as prescribed.  Please take Tessalon  as needed for cough.

## 2024-08-07 ENCOUNTER — Telehealth: Admitting: Family Medicine

## 2024-08-07 DIAGNOSIS — E1165 Type 2 diabetes mellitus with hyperglycemia: Secondary | ICD-10-CM

## 2024-08-07 DIAGNOSIS — Z794 Long term (current) use of insulin: Secondary | ICD-10-CM | POA: Diagnosis not present

## 2024-08-07 DIAGNOSIS — J069 Acute upper respiratory infection, unspecified: Secondary | ICD-10-CM | POA: Diagnosis not present
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
## 2024-08-07 NOTE — Patient Instructions (Signed)
Diabetes Mellitus and Sick Day Management Make a plan for sick days as part of your plan to manage your diabetes. Being sick can cause stress and loss of body fluids (dehydration). These problems can cause your level of blood sugar (glucose) to rise. Common illnesses that can cause problems for people with diabetes mellitus include colds, fever, nausea, vomiting, diarrhea, and the flu. The following information offers guidance on how to care for yourself when you are sick with an illness that lasts for about 24 hours or less. Your health care provider may also give you more specific instructions. How to manage your blood glucose while sick  Check your blood glucose every 2-4 hours, or as often as told by your health care provider. If you use insulin, take your usual dose. If your blood glucose is still too high, you may need to take an extra dose of insulin as told by your health care provider. Know your treatment goals for sick days. Your target blood glucose levels may be different when you are sick. If you use a diabetes medicine that you take by mouth (orally), keep taking your medicines. Have a plan with your health care provider for how to take: Your oral diabetes medicines. Hormone medicines that you inject to help with your diabetes. Follow these instructions at home Check your ketones Ketones are chemicals created by the liver. If you have type 1 diabetes, check your urine for ketones every 4 hours. If you have type 2 diabetes, check your urine for ketones as often as told by your health care provider. Eating and drinking Drink enough fluid to keep your urine pale yellow. If you have a fever, vomiting, or diarrhea, you can become dehydrated faster. Do not drink alcohol, caffeine, or drinks with a lot of sugar. Follow instructions from your health care provider about what drinks to avoid. When you are sick, you need to eat some form of carbohydrates (carbs). Eat 45-50 grams (45-50 g) of  carbs every 3-4 hours until you feel better. All of the food and drink choices below have about 15 g of carbs in them. Plan ahead and keep some of these on hand so you have them if you get sick. 4-6 oz (120-177 mL) of a carbonated drink that contains sugar. This includes regular soda. Do not drink diet soda. Open the drink and let it sit at room temperature for a few minutes. This may make it easier to drink. 4 oz (120 g) regular gelatin dessert. 4 oz (120 mL) fruit juice. 4 oz (120 g) ice cream or frozen yogurt. 2 oz (60 g) sherbet. 1 slice bread or toast. 6 saltine crackers. 5 vanilla wafers. Medicines Take over-the-counter and prescription medicines only as told by your health care provider. Check medicine labels for added sugars. Some medicines may contain sugar or types of sugars that can raise your blood glucose level. Contact a health care provider if: You have been sick or have had a fever for 2 days or longer and you are not getting better. Your blood glucose is at or above 240 mg/dL (09.8 mmol/L), even after you take an extra dose of insulin. You vomit every time you drink fluids. For more than 6 hours, you have: Nausea. Vomiting. Diarrhea. Get help right away if: You have trouble breathing. You have moderate or high levels of ketones in your urine. You have a change in how you think, feel, or act (mental status). You get symptoms of diabetic ketoacidosis. These include: Nausea.  Vomiting. Excessive thirst. Excessive urination. Breath that smells fruity or sweet. Rapid breathing. Pain in the abdomen. Your blood glucose is lower than 54 mg/dL (3.0 mmol/L). You used emergency glucagon to treat low blood glucose. These symptoms may be an emergency. Get help right away. Call 911. Do not wait to see if the symptoms will go away. Do not drive yourself to the hospital. This information is not intended to replace advice given to you by your health care provider. Make sure you  discuss any questions you have with your health care provider. Document Revised: 01/28/2022 Document Reviewed: 01/28/2022 Elsevier Patient Education  2024 [employer].

## 2024-08-09 ENCOUNTER — Ambulatory Visit: Payer: Self-pay

## 2024-08-09 ENCOUNTER — Ambulatory Visit: Admitting: Nurse Practitioner

## 2024-08-09 ENCOUNTER — Encounter: Payer: Self-pay | Admitting: Nurse Practitioner

## 2024-08-09 VITALS — BP 122/76 | HR 84 | Temp 97.9°F | Ht 66.0 in | Wt 311.0 lb

## 2024-08-09 DIAGNOSIS — B349 Viral infection, unspecified: Secondary | ICD-10-CM | POA: Diagnosis not present

## 2024-08-09 DIAGNOSIS — B3731 Acute candidiasis of vulva and vagina: Secondary | ICD-10-CM | POA: Diagnosis not present

## 2024-08-09 MED ORDER — PROMETHAZINE-DM 6.25-15 MG/5ML PO SYRP: 5.0000 mL | ORAL_SOLUTION | Freq: Four times a day (QID) | ORAL | 0 refills | Status: AC | PRN

## 2024-08-09 NOTE — Telephone Encounter (Signed)
 Appt made at [facility] due to no openings at PCP office today  FYI Only or Action Required?: FYI only for provider: appointment scheduled on 08/09/2024 at 3:50pm at [facility] with Elveria Quarry.  Patient was last seen in primary care on 08/07/2024 by Blair, Diane W, FNP.  Called Nurse Triage reporting Cough.  Symptoms began several days ago.  Interventions attempted: Prescription medications: Zpak, Tessalon  Pearls and Rest, hydration, or home remedies.  Symptoms are: gradually worsening.  Triage Disposition: See Physician Within 24 Hours  Patient/caregiver understands and will follow disposition?: Yes            Copied from CRM #[id number]. Topic: Clinical - Red Word Triage >> Aug 09, 2024  8:41 AM Myrick T wrote: Red Word that prompted transfer to Nurse Triage: patient called stated her symptoms from her flu diagnosis on 12/16 has gotten worse. She says the headache, body ache, fever and no appetite is worse now than before. Patient is requesting Tamiflu. Reason for Disposition  [1] Continuous (nonstop) coughing interferes with work or school AND [2] no improvement using cough treatment per Care Advice  Answer Assessment - Initial Assessment Questions Virtual visit with Dr Tobie 08/06/2024 at [facility] Prescribed Zpak and Tessalon  Pearls --two tablets left in Zpak and Tessalon  Pearls not helping cough per patient Patient states she feels like she is getting worse --having chills, body aches, fatigue/weakness/generalized malaise  Cough Nasal congestion now and chills now Nasal drainage, body aches, fatigued/weak/malaised Coughing up and blowing yellow out of nose No chance of pregnancy  Appt made at [facility] due to no openings at PCP office Patient is advised to call us  back if anything changes or with any further questions/concerns. Patient is advised that if anything worsens to go to the Emergency  Room. Patient verbalized understanding.  Answer Assessment - Initial Assessment Questions Virtual visit with Dr Tobie 08/06/2024 at [facility] Prescribed Zpak and Tessalon  Pearls --two tablets left in Zpak and Tessalon  Pearls not helping cough per patient   Dry cough Nasal congestion now and chills now Nasal drainage, body aches, fatigued/weak/malaised Coughing up and blowing yellow out of nose No chance of pregnancy  Patient is advised to call us  back if anything changes or with any further questions/concerns. Patient is advised that if anything worsens to go to the Emergency Room. Patient verbalized understanding.  Protocols used: Sinus Pain or Congestion-A-AH, Cough - Acute Productive-A-AH

## 2024-08-09 NOTE — Progress Notes (Signed)
 "  Subjective:    Patient ID: Charlena LOISE Arm, female    DOB: 24-Aug-1980, 43 y.o.   MRN: [id number]  Cough Associated symptoms include chills, a fever, postnasal drip and a sore throat. Pertinent negatives include no chest pain, shortness of breath or wheezing.   Discussed the use of AI scribe software for clinical note transcription with the patient, who gave verbal consent to proceed.  History of Present Illness LAVEYAH ORIOL is a 43 year old female with diabetes who presents with symptoms of an upper respiratory illness.  Symptoms began last Wednesday, following her grandmother's funeral on Monday. Initially, she experienced a scratchy and hoarse throat, which progressed to nasal congestion, weakness, and fatigue. She notes a loss of taste and has not slept well for two to three days due to nasal drip. Her cough is mostly dry, with yellowish mucus when she blows her nose. She had a fever of 101F last night and feels very tired and unwell. Initially, she had a sore throat, which has since improved, but she still experiences irritation when swallowing. She also reports itching in her ears and pain along her jawline, similar to symptoms she experienced in 2023 when she tested positive for the flu.  She has diabetes and mentions that her blood sugar levels have been elevated, ranging from 220 to 300 mg/dL, taking 90 units of Humalog  insulin  earlier today. She is concerned about over-the-counter medications affecting her glucose levels. She has not taken any fever-reducing medications because she was concerned about the effect of over-the-counter medications on her blood sugar.  She is urinating normally and has not experienced diarrhea or vomiting. She reports a sinus headache and pressure, which began earlier today.   Review of Systems  Constitutional:  Positive for chills, fatigue and fever.  HENT:  Positive for congestion, postnasal drip, sinus pressure and sore throat. Negative for  trouble swallowing.   Respiratory:  Positive for cough. Negative for chest tightness, shortness of breath and wheezing.   Cardiovascular:  Negative for chest pain.  Gastrointestinal:  Negative for diarrhea, nausea and vomiting.  Social History[1]      Objective:   Physical Exam Vitals and nursing note reviewed.  Constitutional:      General: She is not in acute distress. HENT:     Ears:     Comments: RT TM: clear effusion. Left TM: minimal effusion. No erythema.     Mouth/Throat:     Mouth: Mucous membranes are moist.     Pharynx: Oropharynx is clear.  Neck:     Comments: Mild anterior cervical adenopathy.  Cardiovascular:     Rate and Rhythm: Normal rate and regular rhythm.  Pulmonary:     Effort: Pulmonary effort is normal.     Breath sounds: Normal breath sounds.  Musculoskeletal:     Cervical back: Neck supple.  Lymphadenopathy:     Cervical: Cervical adenopathy present.  Skin:    General: Skin is warm and dry.  Neurological:     Mental Status: She is alert.  Psychiatric:        Mood and Affect: Mood normal.        Behavior: Behavior normal.        Thought Content: Thought content normal.    Today's Vitals   08/09/24 1603  BP: 122/76  Pulse: 84  Temp: 97.9 F (36.6 C)  SpO2: 97%  Weight: (!) 311 lb (141.1 kg)  Height: 5' 6 (1.676 m)   Body mass index is  50.2 kg/m.        Assessment & Plan:  1. Viral illness (Primary) - Continue Z-Pak (azithromycin ). Has 2 more days of therapy.  - Prescribed promethazine  DM for cough and sleep aid. - Advised monitoring for worsening symptoms such as vomiting, inability to keep fluids down, chest pain, wheezing, or shortness of breath. - Encouraged rest and fluid intake. - Advised against contact with vulnerable individuals until symptoms improve.  - promethazine -dextromethorphan (PROMETHAZINE -DM) 6.25-15 MG/5ML syrup; Take 5 mLs by mouth 4 (four) times daily as needed. For cough. Drowsiness precautions.  Dispense:  118 mL; Refill: 0  2. Vaginal candidiasis  - fluconazole  (DIFLUCAN ) 150 MG tablet; One po qd prn yeast infection; may repeat in 3-4 days if needed  Dispense: 2 tablet; Refill: 0   Return if symptoms worsen or fail to improve.        [1]  Social History Tobacco Use   Smoking status: Never   Smokeless tobacco: Never  Vaping Use   Vaping status: Never Used  Substance Use Topics   Alcohol use: No   Drug use: No   "

## 2024-08-09 NOTE — Telephone Encounter (Signed)
 Noted

## 2024-08-12 ENCOUNTER — Encounter: Payer: Self-pay | Admitting: Internal Medicine

## 2024-08-16 ENCOUNTER — Telehealth (INDEPENDENT_AMBULATORY_CARE_PROVIDER_SITE_OTHER): Payer: Self-pay | Admitting: Internal Medicine

## 2024-08-16 ENCOUNTER — Encounter: Payer: Self-pay | Admitting: Internal Medicine

## 2024-08-16 DIAGNOSIS — Z794 Long term (current) use of insulin: Secondary | ICD-10-CM | POA: Diagnosis not present

## 2024-08-16 DIAGNOSIS — J309 Allergic rhinitis, unspecified: Secondary | ICD-10-CM | POA: Diagnosis not present

## 2024-08-16 DIAGNOSIS — E1165 Type 2 diabetes mellitus with hyperglycemia: Secondary | ICD-10-CM

## 2024-08-16 MED ORDER — AZELASTINE HCL 0.1 % NA SOLN: 2.0000 | Freq: Two times a day (BID) | NASAL | 2 refills | Status: AC

## 2024-08-16 MED ORDER — CETIRIZINE HCL 10 MG PO TABS: 10.0000 mg | ORAL_TABLET | Freq: Every day | ORAL | 3 refills | Status: AC

## 2024-08-16 NOTE — Assessment & Plan Note (Addendum)
 Lab Results  Component Value Date   HGBA1C 9.1 06/09/2024   Uncontrolled On Humulin , followed by endocrinology - advised to take 110 Units QAM and continue 90 units QHS until she gets instructions from her endocrinology office Advised to follow diabetic diet Has had fluctuation in glycemic profile, FMLA paperwork provided Work note provided due to recent sinusitis and uncontrolled DM

## 2024-08-16 NOTE — Patient Instructions (Addendum)
 Please start taking cetirizine  for allergies.  Please start using azelastine nasal spray for allergic sinusitis.  Please take Humulin  110 Units in the morning and 90 Units in the evening.  Please follow-up with your endocrinologist for high blood glucose levels.

## 2024-08-16 NOTE — Progress Notes (Signed)
 "    Virtual Visit via Video Note   Because of Julie Berry's co-morbid illnesses, she is at least at moderate risk for complications without adequate follow up.  This format is felt to be most appropriate for this patient at this time.  All issues noted in this document were discussed and addressed.  A limited physical exam was performed with this format.      Evaluation Performed:  Follow-up visit  Date:  08/16/2024   ID:  Julie Berry, DOB November 28, 1980, MRN [id number]  Patient Location: Home Provider Location: Office/Clinic  Participants: Patient Location of Patient: Home Location of Provider: Telehealth Consent was obtain for visit to be over via telehealth. I verified that I am speaking with the correct person using two identifiers.  PCP:  Edman Meade PEDLAR, FNP   Chief Complaint: Nasal congestion and headache  History of Present Illness:    Julie Berry is a 43 y.o. female who has a video visit for complaint of sore throat, nasal congestion, sinus pressure related headache and postnasal drip for the last 3 weeks. She also reports dry cough.  She was given azithromycin  in the last visit 10 days ago, which she has completed.  She was given promethazine -DM syrup and Tessalon  for cough. Denies any fever or chills. She also reports mild nausea, but denies any episode of vomiting recently. Denies dyspnea or wheezing currently.  She also reports elevated blood glucose levels recently, up to 300s despite taking her Humulin  100 U QAM and 90 U qHS.  She has tried contacting her endocrinologist, but has not heard back yet.  The patient does not have symptoms concerning for COVID-19 infection (fever, chills, cough, or new shortness of breath).   Past Medical, Surgical, Social History, Allergies, and Medications have been Reviewed.  Past Medical History:  Diagnosis Date   Complication of anesthesia    heartrate dropped with anesthesia   Depressed    Diabetes mellitus  without complication (HCC)    Gallstones    HTN (hypertension)    pt denies   Hyperglycemia    Leukocytosis    Low back pain    Lump or mass in breast    Malaise and fatigue    Morbid obesity (HCC)    Sciatica    Sleep apnea    UTI (urinary tract infection)    Past Surgical History:  Procedure Laterality Date   CHOLECYSTECTOMY  06/22/2012   Procedure: LAPAROSCOPIC CHOLECYSTECTOMY;  Surgeon: Oneil DELENA Budge, MD;  Location: [facility];  Service: General;  Laterality: N/A;   ESOPHAGOGASTRODUODENOSCOPY N/A 06/09/2014   Procedure: ESOPHAGOGASTRODUODENOSCOPY (EGD);  Surgeon: Claudis RAYMOND Rivet, MD;  Location: [facility];  Service: Endoscopy;  Laterality: N/A;  200-moved to 145 Ann to notify pt     Active Medications[1]   Allergies:   Amoxicillin, Penicillins, Dilaudid   [hydromorphone  hcl], Meloxicam , Peanut (diagnostic), Contrast media [iodinated contrast media], Latex, and Trulicity  [dulaglutide ]   ROS:   Please see the history of present illness. All other systems reviewed and are negative.   Labs/Other Tests and Data Reviewed:    Recent Labs: 10/30/2023: ALT 15 10/31/2023: BUN 13; Creatinine, Ser 0.75; Potassium 3.6; Sodium 134 11/01/2023: Hemoglobin 12.4; Platelets 304   Recent Lipid Panel Lab Results  Component Value Date/Time   CHOL 165 03/22/2019 08:20 AM   TRIG 85.0 03/22/2019 08:20 AM   HDL 34.90 (L) 03/22/2019 08:20 AM   CHOLHDL 5 03/22/2019 08:20 AM   LDLCALC 113 (H) 03/22/2019 08:20 AM  LDLDIRECT 112.0 07/19/2016 03:09 PM    Wt Readings from Last 3 Encounters:  08/09/24 (!) 311 lb (141.1 kg)  08/03/24 (!) 307 lb 9.6 oz (139.5 kg)  07/05/24 (!) 312 lb (141.5 kg)     Objective:    Vital Signs:  There were no vitals taken for this visit.   VITAL SIGNS:  reviewed GEN:  no acute distress EYES:  sclerae anicteric, EOMI - Extraocular Movements Intact RESPIRATORY:  normal respiratory effort, symmetric expansion NEURO:  alert and oriented x 3, no obvious focal  deficit PSYCH:  normal affect  ASSESSMENT & PLAN:    Assessment & Plan Allergic sinusitis Since her symptoms have been persistent for 3 weeks, and has recently completed oral azithromycin , would avoid further antibiotic use for now Likely component of allergies Started Zyrtec  10 mg QD Azelastine nasal spray as needed for nasal congestion Advised to use vaporizer and/or sinus inhaler as needed for nasal congestion Uncontrolled type 2 diabetes mellitus with hyperglycemia, with long-term current use of insulin  (HCC) Lab Results  Component Value Date   HGBA1C 9.1 06/09/2024   Uncontrolled On Humulin , followed by endocrinology - advised to take 110 Units QAM and continue 90 units QHS until she gets instructions from her endocrinology office Advised to follow diabetic diet Has had fluctuation in glycemic profile, FMLA paperwork provided Work note provided due to recent sinusitis and uncontrolled DM    I discussed the assessment and treatment plan with the patient. The patient was provided an opportunity to ask questions, and all were answered. The patient agreed with the plan and demonstrated an understanding of the instructions.   The patient was advised to call back or seek an in-person evaluation if the symptoms worsen or if the condition fails to improve as anticipated.  The above assessment and management plan was discussed with the patient. The patient verbalized understanding of and has agreed to the management plan.   Medication Adjustments/Labs and Tests Ordered: Current medicines are reviewed at length with the patient today.  Concerns regarding medicines are outlined above.   Tests Ordered: No orders of the defined types were placed in this encounter.   Medication Changes: No orders of the defined types were placed in this encounter.    Note: This dictation was prepared with Dragon dictation along with smaller phrase technology. Similar sounding words can be  transcribed inadequately or may not be corrected upon review. Any transcriptional errors that result from this process are unintentional.      Disposition:  Follow up  Signed, Suzzane MARLA Blanch, MD  08/16/2024 9:43 AM     [facility]     [1]  Current Meds  Medication Sig   Insulin  Pen Needle (PEN NEEDLES) 33G X 4 MM MISC 1 each by Other route See admin instructions. Use 1 pen needle to inject insulin  4 times daily.   insulin  regular human CONCENTRATED (HUMULIN  R U-500 KWIKPEN) 500 UNIT/ML KwikPen Inject 90-100 Units into the skin 2 (two) times daily with a meal. INJECT 100 UNITS AT BREAKFAST AND 90 UNITS AT  DINNER.   promethazine -dextromethorphan (PROMETHAZINE -DM) 6.25-15 MG/5ML syrup Take 5 mLs by mouth 4 (four) times daily as needed. For cough. Drowsiness precautions.   "

## 2024-08-16 NOTE — Assessment & Plan Note (Addendum)
 Since her symptoms have been persistent for 3 weeks, and has recently completed oral azithromycin , would avoid further antibiotic use for now Likely component of allergies Started Zyrtec  10 mg QD Azelastine nasal spray as needed for nasal congestion Advised to use vaporizer and/or sinus inhaler as needed for nasal congestion

## 2024-08-24 ENCOUNTER — Telehealth: Payer: Self-pay | Admitting: Family Medicine

## 2024-08-24 ENCOUNTER — Encounter: Payer: Self-pay | Admitting: Internal Medicine

## 2024-08-24 NOTE — Telephone Encounter (Signed)
 Disability form Copied Noted Sleeved Original placed provider box (Dr Patel)-lat seen 12.29.2025 Copy placed front desk folder

## 2024-08-27 ENCOUNTER — Telehealth: Payer: Self-pay

## 2024-08-27 NOTE — Telephone Encounter (Signed)
 Copied from CRM #[id number]. Topic: Clinical - Medical Advice >> Aug 27, 2024 11:56 AM Harlene ORN wrote: Reason for CRM: Patient called to check on a form that she dropped off on 08/24/2024, saying that she has been out of work sick and that she will still get paid. Informed the patient that the turnaround time for paperwork is 7-10 business days and she did express underestanding; having said that, she would still like for the office to call back to give her an update.

## 2024-08-30 NOTE — Telephone Encounter (Signed)
 Per dr patel we cannot complete paperwork with future dates.

## 2024-08-30 NOTE — Telephone Encounter (Signed)
 Form placed in signature folder for today.

## 2024-08-31 ENCOUNTER — Telehealth: Payer: Self-pay

## 2024-08-31 NOTE — Telephone Encounter (Signed)
 Copied from CRM [id number]. Topic: General - Other >> Aug 31, 2024 10:50 AM Everette C wrote: Reason for CRM: The patient has called to follow up on their request for additional documentation related to their disability and diabetic concerns. The patient needs documentation from their PCP stating their their health concerns have made it difficult for them to receive a regular income. Please contact further when possible

## 2024-09-01 NOTE — Telephone Encounter (Signed)
 Called patient will keep the 2/23 video call

## 2024-09-01 NOTE — Telephone Encounter (Signed)
 Kindly schedule a tele-visit to discuss this further. Thank you

## 2024-09-30 ENCOUNTER — Encounter: Admitting: Obstetrics and Gynecology

## 2024-10-11 ENCOUNTER — Ambulatory Visit: Payer: Self-pay | Admitting: Family Medicine

## 2024-10-11 ENCOUNTER — Ambulatory Visit: Admitting: Family Medicine
